# Patient Record
Sex: Male | Born: 1950 | Race: Black or African American | Hispanic: No | Marital: Married | Smoking: Former smoker
Health system: Southern US, Community
[De-identification: ages and names within clinical notes are randomized; demographics above are authoritative.]

## PROBLEM LIST (undated history)

## (undated) DIAGNOSIS — E785 Hyperlipidemia, unspecified: Secondary | ICD-10-CM

## (undated) DIAGNOSIS — N5201 Erectile dysfunction due to arterial insufficiency: Secondary | ICD-10-CM

## (undated) DIAGNOSIS — N2581 Secondary hyperparathyroidism of renal origin: Secondary | ICD-10-CM

## (undated) DIAGNOSIS — N2889 Other specified disorders of kidney and ureter: Secondary | ICD-10-CM

## (undated) DIAGNOSIS — E119 Type 2 diabetes mellitus without complications: Secondary | ICD-10-CM

## (undated) DIAGNOSIS — G4700 Insomnia, unspecified: Secondary | ICD-10-CM

## (undated) DIAGNOSIS — Z992 Dependence on renal dialysis: Secondary | ICD-10-CM

## (undated) DIAGNOSIS — M2550 Pain in unspecified joint: Secondary | ICD-10-CM

## (undated) DIAGNOSIS — E669 Obesity, unspecified: Secondary | ICD-10-CM

## (undated) DIAGNOSIS — I509 Heart failure, unspecified: Secondary | ICD-10-CM

## (undated) DIAGNOSIS — Z87442 Personal history of urinary calculi: Secondary | ICD-10-CM

## (undated) DIAGNOSIS — I10 Essential (primary) hypertension: Secondary | ICD-10-CM

## (undated) DIAGNOSIS — N289 Disorder of kidney and ureter, unspecified: Secondary | ICD-10-CM

## (undated) DIAGNOSIS — N189 Chronic kidney disease, unspecified: Secondary | ICD-10-CM

## (undated) DIAGNOSIS — D649 Anemia, unspecified: Secondary | ICD-10-CM

## (undated) HISTORY — DX: Dependence on renal dialysis: Z99.2

## (undated) HISTORY — PX: RENAL BIOPSY: SHX156

## (undated) HISTORY — PX: TONSILLECTOMY: SUR1361

## (undated) HISTORY — PX: RENAL BIOPSY, PERCUTANEOUS: SUR144

## (undated) HISTORY — DX: Heart failure, unspecified: I50.9

---

## 2009-08-19 ENCOUNTER — Ambulatory Visit (HOSPITAL_COMMUNITY): Admission: RE | Admit: 2009-08-19 | Discharge: 2009-08-19 | Payer: Self-pay | Admitting: General Surgery

## 2012-01-04 ENCOUNTER — Encounter (HOSPITAL_COMMUNITY): Payer: Self-pay | Admitting: Emergency Medicine

## 2012-01-04 ENCOUNTER — Emergency Department (HOSPITAL_COMMUNITY)
Admission: EM | Admit: 2012-01-04 | Discharge: 2012-01-04 | Disposition: A | Discharge status: Home / Self Care | Payer: BC Managed Care – PPO | Attending: Emergency Medicine | Admitting: Emergency Medicine

## 2012-01-04 DIAGNOSIS — R7989 Other specified abnormal findings of blood chemistry: Secondary | ICD-10-CM | POA: Insufficient documentation

## 2012-01-04 DIAGNOSIS — I10 Essential (primary) hypertension: Secondary | ICD-10-CM | POA: Insufficient documentation

## 2012-01-04 DIAGNOSIS — Z87891 Personal history of nicotine dependence: Secondary | ICD-10-CM | POA: Insufficient documentation

## 2012-01-04 DIAGNOSIS — N19 Unspecified kidney failure: Secondary | ICD-10-CM

## 2012-01-04 DIAGNOSIS — E119 Type 2 diabetes mellitus without complications: Secondary | ICD-10-CM | POA: Insufficient documentation

## 2012-01-04 HISTORY — DX: Essential (primary) hypertension: I10

## 2012-01-04 LAB — CBC WITH DIFFERENTIAL/PLATELET
Basophils Absolute: 0 10*3/uL (ref 0.0–0.1)
Basophils Relative: 0 % (ref 0–1)
Eosinophils Absolute: 0.3 10*3/uL (ref 0.0–0.7)
Eosinophils Relative: 4 % (ref 0–5)
HCT: 34.5 % — ABNORMAL LOW (ref 39.0–52.0)
MCH: 27.9 pg (ref 26.0–34.0)
MCHC: 34.5 g/dL (ref 30.0–36.0)
MCV: 81 fL (ref 78.0–100.0)
Monocytes Absolute: 0.7 10*3/uL (ref 0.1–1.0)
Platelets: 293 10*3/uL (ref 150–400)
RDW: 12.1 % (ref 11.5–15.5)
WBC: 6.7 10*3/uL (ref 4.0–10.5)

## 2012-01-04 LAB — BASIC METABOLIC PANEL
CO2: 27 mEq/L (ref 19–32)
Calcium: 9.7 mg/dL (ref 8.4–10.5)
Creatinine, Ser: 3.9 mg/dL — ABNORMAL HIGH (ref 0.50–1.35)
GFR calc Af Amer: 18 mL/min — ABNORMAL LOW (ref >90)
GFR calc non Af Amer: 15 mL/min — ABNORMAL LOW (ref >90)
Sodium: 141 mEq/L (ref 135–145)

## 2012-01-04 NOTE — ED Provider Notes (Signed)
History    This chart was scribed for Garnet Chatmon B. Karle Starch, MD, MD by Rhae Lerner. The patient was seen in room APA02 and the patient's care was started at 12:35PM.   CSN: [id number]  Arrival date & time 01/04/12  1141   First MD Initiated Contact with Patient 01/04/12 1233      Chief Complaint  Patient presents with  . Abnormal Lab    (Consider location/radiation/quality/duration/timing/severity/associated sxs/prior treatment) The history is provided by the patient.   Steven Perez is a 61 y.o. male who presents to the Emergency Department due to Dr. Gerarda Fraction recommending that he come to ED for further evaluation after he went to visit office and had labs. Pt reports that he has generalized weakness. Pt reports that he thinks he had a creatine of 4 from lab results. Denies SOB, n/v/d, urinary problems, fall, chest pain, appetite change. Pt reports hx of HTN and DM. Pt denies any current pain.   Past Medical History  Diagnosis Date  . Diabetes mellitus   . Hypertension     Past Surgical History  Procedure Date  . Tonsillectomy     No family history on file.  History  Substance Use Topics  . Smoking status: Former Research scientist (life sciences)  . Smokeless tobacco: Not on file  . Alcohol Use: Yes     occasionally      Review of Systems  All other systems reviewed and are negative.  10 Systems reviewed and all are negative for acute change except as noted in the HPI.    Allergies  Tetanus toxoids  Home Medications   Current Outpatient Rx  Name Route Sig Dispense Refill  . ACETAMINOPHEN 500 MG PO TABS Oral Take 500 mg by mouth at bedtime as needed. Pain    . ASPIRIN EC 81 MG PO TBEC Oral Take 81 mg by mouth daily.    Marland Kitchen LOSARTAN POTASSIUM-HCTZ 50-12.5 MG PO TABS Oral Take 1 tablet by mouth daily.    Marland Kitchen SIMVASTATIN 80 MG PO TABS Oral Take 40 mg by mouth at bedtime.    Marland Kitchen SITAGLIPTIN-METFORMIN HCL 50-1000 MG PO TABS Oral Take 1 tablet by mouth 2 (two) times daily.    Marland Kitchen TADALAFIL 10 MG PO  TABS Oral Take 10 mg by mouth daily as needed. Sexual Arousal    . TRAZODONE HCL 50 MG PO TABS Oral Take 100 mg by mouth at bedtime.    Marland Kitchen VERAPAMIL HCL ER (CO) 240 MG PO TB24 Oral Take 240 mg by mouth at bedtime.      BP 174/94  Pulse 91  Temp 98.5 F (36.9 C) (Oral)  Resp 18  Ht 5\' 8"  (1.727 m)  Wt 240 lb (108.863 kg)  BMI 36.49 kg/m2  SpO2 99%  Physical Exam  Nursing note and vitals reviewed. Constitutional: He is oriented to person, place, and time. He appears well-developed and well-nourished.  HENT:  Head: Normocephalic and atraumatic.  Eyes: EOM are normal. Pupils are equal, round, and reactive to light.  Neck: Normal range of motion. Neck supple.  Cardiovascular: Normal rate, normal heart sounds and intact distal pulses.   Pulmonary/Chest: Effort normal and breath sounds normal.  Abdominal: Bowel sounds are normal. He exhibits no distension. There is no tenderness.  Musculoskeletal: Normal range of motion. He exhibits no edema and no tenderness.  Neurological: He is alert and oriented to person, place, and time. He has normal strength. No cranial nerve deficit or sensory deficit.  Skin: Skin is warm and dry. No  rash noted.  Psychiatric: He has a normal mood and affect.    ED Course  Procedures (including critical care time) DIAGNOSTIC STUDIES: Oxygen Saturation is 99% on room air, normal by my interpretation.    COORDINATION OF CARE: 12:38PM EDP discusses pt ED treatment with pt     Labs Reviewed  CBC WITH DIFFERENTIAL - Abnormal; Notable for the following:    Hemoglobin 11.9 (*)     HCT 34.5 (*)     All other components within normal limits  BASIC METABOLIC PANEL - Abnormal; Notable for the following:    BUN 47 (*)     Creatinine, Ser 3.90 (*)     GFR calc non Af Amer 15 (*)     GFR calc Af Amer 18 (*)     All other components within normal limits   No results found.   No diagnosis found.    MDM  Confirmed with Dr. Nolon Rod office that patient had  baseline Cr <1.0 in December. Apparently they tried to get him an appointment with Dr. Lowanda Foster but did not hear back. I spoke with both Dr. Lowanda Foster and his clinic staff and they will see him in the office next week. He is not anuric, not acidotic and not hyperkalemic. No indication for emergent dialysis or admission.   I personally performed the services described in the documentation, which were scribed in my presence. The recorded information has been reviewed and considered.         Puja Caffey B. Karle Starch, MD 01/04/12 1355

## 2012-01-04 NOTE — ED Notes (Signed)
Patient states had annual physical with Dr Gerarda Fraction, told to come to ED for further evaluation. Creatinine levels were elevated. Denies urinary changes or s/s. Patient with history of HTN and DM.

## 2012-01-04 NOTE — ED Notes (Signed)
MD at bedside. 

## 2013-10-23 ENCOUNTER — Ambulatory Visit (INDEPENDENT_AMBULATORY_CARE_PROVIDER_SITE_OTHER): Payer: 59 | Admitting: Urology

## 2013-10-23 ENCOUNTER — Other Ambulatory Visit: Payer: Self-pay | Admitting: Urology

## 2013-10-23 DIAGNOSIS — N529 Male erectile dysfunction, unspecified: Secondary | ICD-10-CM

## 2013-10-23 DIAGNOSIS — R310 Gross hematuria: Secondary | ICD-10-CM

## 2013-10-31 ENCOUNTER — Ambulatory Visit (HOSPITAL_COMMUNITY)
Admission: RE | Admit: 2013-10-31 | Discharge: 2013-10-31 | Disposition: A | Discharge status: Home / Self Care | Payer: 59 | Source: Ambulatory Visit | Attending: Urology | Admitting: Urology

## 2013-10-31 ENCOUNTER — Encounter (HOSPITAL_COMMUNITY): Payer: Self-pay

## 2013-10-31 DIAGNOSIS — N289 Disorder of kidney and ureter, unspecified: Secondary | ICD-10-CM | POA: Insufficient documentation

## 2013-10-31 DIAGNOSIS — R310 Gross hematuria: Secondary | ICD-10-CM | POA: Insufficient documentation

## 2013-10-31 MED ORDER — SODIUM CHLORIDE 0.9 % IJ SOLN
INTRAMUSCULAR | Status: AC
  Filled 2013-10-31: qty 750

## 2013-10-31 MED ORDER — IOHEXOL 300 MG/ML  SOLN
125.0000 mL | Freq: Once | INTRAMUSCULAR | Status: AC | PRN
  Administered 2013-10-31: 125 mL via INTRAVENOUS

## 2013-11-20 ENCOUNTER — Other Ambulatory Visit: Payer: Self-pay | Admitting: Urology

## 2013-11-20 ENCOUNTER — Ambulatory Visit (INDEPENDENT_AMBULATORY_CARE_PROVIDER_SITE_OTHER): Payer: 59 | Admitting: Urology

## 2013-11-20 DIAGNOSIS — N289 Disorder of kidney and ureter, unspecified: Secondary | ICD-10-CM

## 2013-11-20 DIAGNOSIS — R310 Gross hematuria: Secondary | ICD-10-CM

## 2013-11-22 ENCOUNTER — Encounter (HOSPITAL_COMMUNITY): Payer: Self-pay

## 2013-11-22 ENCOUNTER — Other Ambulatory Visit: Payer: Self-pay | Admitting: Urology

## 2013-11-22 ENCOUNTER — Ambulatory Visit (HOSPITAL_COMMUNITY)
Admission: RE | Admit: 2013-11-22 | Discharge: 2013-11-22 | Disposition: A | Discharge status: Home / Self Care | Payer: 59 | Source: Ambulatory Visit | Attending: Urology | Admitting: Urology

## 2013-11-22 DIAGNOSIS — N289 Disorder of kidney and ureter, unspecified: Secondary | ICD-10-CM | POA: Insufficient documentation

## 2013-11-22 LAB — POCT I-STAT CREATININE: Creatinine, Ser: 9.8 mg/dL — ABNORMAL HIGH (ref 0.50–1.35)

## 2013-11-22 MED ORDER — SODIUM CHLORIDE 0.9 % IV SOLN
INTRAVENOUS | Status: AC
  Filled 2013-11-22: qty 100

## 2013-11-26 ENCOUNTER — Inpatient Hospital Stay (HOSPITAL_COMMUNITY): Payer: 59

## 2013-11-26 ENCOUNTER — Inpatient Hospital Stay (HOSPITAL_COMMUNITY)
Admission: EM | Admit: 2013-11-26 | Discharge: 2013-12-04 | DRG: 683 | Disposition: A | Discharge status: Home / Self Care | Payer: 59 | Attending: Internal Medicine | Admitting: Internal Medicine

## 2013-11-26 ENCOUNTER — Encounter (HOSPITAL_COMMUNITY): Payer: Self-pay | Admitting: Emergency Medicine

## 2013-11-26 DIAGNOSIS — E876 Hypokalemia: Secondary | ICD-10-CM | POA: Diagnosis not present

## 2013-11-26 DIAGNOSIS — D649 Anemia, unspecified: Secondary | ICD-10-CM | POA: Diagnosis present

## 2013-11-26 DIAGNOSIS — E872 Acidosis, unspecified: Secondary | ICD-10-CM | POA: Diagnosis present

## 2013-11-26 DIAGNOSIS — E785 Hyperlipidemia, unspecified: Secondary | ICD-10-CM | POA: Diagnosis present

## 2013-11-26 DIAGNOSIS — Q619 Cystic kidney disease, unspecified: Secondary | ICD-10-CM | POA: Diagnosis not present

## 2013-11-26 DIAGNOSIS — Z833 Family history of diabetes mellitus: Secondary | ICD-10-CM

## 2013-11-26 DIAGNOSIS — D62 Acute posthemorrhagic anemia: Secondary | ICD-10-CM

## 2013-11-26 DIAGNOSIS — E119 Type 2 diabetes mellitus without complications: Secondary | ICD-10-CM | POA: Diagnosis present

## 2013-11-26 DIAGNOSIS — I10 Essential (primary) hypertension: Secondary | ICD-10-CM

## 2013-11-26 DIAGNOSIS — N039 Chronic nephritic syndrome with unspecified morphologic changes: Secondary | ICD-10-CM

## 2013-11-26 DIAGNOSIS — Z87891 Personal history of nicotine dependence: Secondary | ICD-10-CM

## 2013-11-26 DIAGNOSIS — N179 Acute kidney failure, unspecified: Secondary | ICD-10-CM | POA: Diagnosis present

## 2013-11-26 DIAGNOSIS — D631 Anemia in chronic kidney disease: Secondary | ICD-10-CM | POA: Diagnosis present

## 2013-11-26 DIAGNOSIS — N189 Chronic kidney disease, unspecified: Secondary | ICD-10-CM

## 2013-11-26 DIAGNOSIS — N184 Chronic kidney disease, stage 4 (severe): Secondary | ICD-10-CM | POA: Diagnosis present

## 2013-11-26 DIAGNOSIS — N19 Unspecified kidney failure: Secondary | ICD-10-CM

## 2013-11-26 DIAGNOSIS — Z794 Long term (current) use of insulin: Secondary | ICD-10-CM

## 2013-11-26 DIAGNOSIS — I129 Hypertensive chronic kidney disease with stage 1 through stage 4 chronic kidney disease, or unspecified chronic kidney disease: Secondary | ICD-10-CM | POA: Diagnosis present

## 2013-11-26 DIAGNOSIS — D472 Monoclonal gammopathy: Secondary | ICD-10-CM | POA: Diagnosis present

## 2013-11-26 DIAGNOSIS — I150 Renovascular hypertension: Secondary | ICD-10-CM

## 2013-11-26 HISTORY — DX: Hyperlipidemia, unspecified: E78.5

## 2013-11-26 LAB — CBC WITH DIFFERENTIAL/PLATELET
Basophils Absolute: 0 10*3/uL (ref 0.0–0.1)
Basophils Relative: 0 % (ref 0–1)
EOS ABS: 0.1 10*3/uL (ref 0.0–0.7)
Eosinophils Relative: 2 % (ref 0–5)
HCT: 30.8 % — ABNORMAL LOW (ref 39.0–52.0)
HEMOGLOBIN: 10.8 g/dL — AB (ref 13.0–17.0)
LYMPHS ABS: 1.2 10*3/uL (ref 0.7–4.0)
Lymphocytes Relative: 23 % (ref 12–46)
MCH: 27.8 pg (ref 26.0–34.0)
MCHC: 35.1 g/dL (ref 30.0–36.0)
MCV: 79.2 fL (ref 78.0–100.0)
MONOS PCT: 7 % (ref 3–12)
Monocytes Absolute: 0.4 10*3/uL (ref 0.1–1.0)
NEUTROS PCT: 68 % (ref 43–77)
Neutro Abs: 3.7 10*3/uL (ref 1.7–7.7)
Platelets: 253 10*3/uL (ref 150–400)
RBC: 3.89 MIL/uL — AB (ref 4.22–5.81)
RDW: 12.4 % (ref 11.5–15.5)
WBC: 5.4 10*3/uL (ref 4.0–10.5)

## 2013-11-26 LAB — BASIC METABOLIC PANEL
BUN: 138 mg/dL — AB (ref 6–23)
CO2: 16 meq/L — AB (ref 19–32)
Calcium: 9 mg/dL (ref 8.4–10.5)
Chloride: 92 mEq/L — ABNORMAL LOW (ref 96–112)
Creatinine, Ser: 12.12 mg/dL — ABNORMAL HIGH (ref 0.50–1.35)
GFR calc Af Amer: 4 mL/min — ABNORMAL LOW (ref >90)
GFR, EST NON AFRICAN AMERICAN: 4 mL/min — AB (ref >90)
GLUCOSE: 103 mg/dL — AB (ref 70–99)
POTASSIUM: 3.7 meq/L (ref 3.7–5.3)
Sodium: 136 mEq/L — ABNORMAL LOW (ref 137–147)

## 2013-11-26 LAB — URINE MICROSCOPIC-ADD ON

## 2013-11-26 LAB — URINALYSIS, ROUTINE W REFLEX MICROSCOPIC
BILIRUBIN URINE: NEGATIVE
GLUCOSE, UA: NEGATIVE mg/dL
HGB URINE DIPSTICK: NEGATIVE
Ketones, ur: NEGATIVE mg/dL
Nitrite: NEGATIVE
PH: 5 (ref 5.0–8.0)
Protein, ur: NEGATIVE mg/dL
SPECIFIC GRAVITY, URINE: 1.016 (ref 1.005–1.030)
UROBILINOGEN UA: 0.2 mg/dL (ref 0.0–1.0)

## 2013-11-26 NOTE — ED Notes (Signed)
Pt to xray at this time.

## 2013-11-26 NOTE — ED Notes (Signed)
Pt in stating he was told to come to ED due to elevated kidney functions, states he had an MRI to evaluate something on his left kidney last week, since that time he has had labs taken three times and function continues to worsen, hx of renal failure two years ago, pt c/o generalized weakness but denies any pain.

## 2013-11-27 ENCOUNTER — Encounter (HOSPITAL_COMMUNITY): Payer: Self-pay | Admitting: Internal Medicine

## 2013-11-27 DIAGNOSIS — E872 Acidosis, unspecified: Secondary | ICD-10-CM

## 2013-11-27 DIAGNOSIS — D649 Anemia, unspecified: Secondary | ICD-10-CM | POA: Diagnosis present

## 2013-11-27 DIAGNOSIS — D631 Anemia in chronic kidney disease: Secondary | ICD-10-CM

## 2013-11-27 DIAGNOSIS — N189 Chronic kidney disease, unspecified: Secondary | ICD-10-CM

## 2013-11-27 DIAGNOSIS — N179 Acute kidney failure, unspecified: Principal | ICD-10-CM

## 2013-11-27 DIAGNOSIS — E119 Type 2 diabetes mellitus without complications: Secondary | ICD-10-CM | POA: Diagnosis present

## 2013-11-27 DIAGNOSIS — N039 Chronic nephritic syndrome with unspecified morphologic changes: Secondary | ICD-10-CM

## 2013-11-27 DIAGNOSIS — I10 Essential (primary) hypertension: Secondary | ICD-10-CM | POA: Diagnosis present

## 2013-11-27 DIAGNOSIS — N19 Unspecified kidney failure: Secondary | ICD-10-CM

## 2013-11-27 LAB — CK: Total CK: 118 U/L (ref 7–232)

## 2013-11-27 LAB — CBC WITH DIFFERENTIAL/PLATELET
BASOS ABS: 0 10*3/uL (ref 0.0–0.1)
BASOS PCT: 0 % (ref 0–1)
Eosinophils Absolute: 0.1 10*3/uL (ref 0.0–0.7)
Eosinophils Relative: 2 % (ref 0–5)
HEMATOCRIT: 31.6 % — AB (ref 39.0–52.0)
HEMOGLOBIN: 11 g/dL — AB (ref 13.0–17.0)
LYMPHS PCT: 30 % (ref 12–46)
Lymphs Abs: 1.5 10*3/uL (ref 0.7–4.0)
MCH: 27.8 pg (ref 26.0–34.0)
MCHC: 34.8 g/dL (ref 30.0–36.0)
MCV: 79.8 fL (ref 78.0–100.0)
MONO ABS: 0.3 10*3/uL (ref 0.1–1.0)
Monocytes Relative: 6 % (ref 3–12)
NEUTROS ABS: 3 10*3/uL (ref 1.7–7.7)
Neutrophils Relative %: 62 % (ref 43–77)
Platelets: 252 10*3/uL (ref 150–400)
RBC: 3.96 MIL/uL — ABNORMAL LOW (ref 4.22–5.81)
RDW: 12.4 % (ref 11.5–15.5)
WBC: 4.8 10*3/uL (ref 4.0–10.5)

## 2013-11-27 LAB — GLUCOSE, CAPILLARY: Glucose-Capillary: 119 mg/dL — ABNORMAL HIGH (ref 70–99)

## 2013-11-27 LAB — SODIUM, URINE, RANDOM: SODIUM UR: 32 meq/L

## 2013-11-27 LAB — COMPREHENSIVE METABOLIC PANEL
ALK PHOS: 32 U/L — AB (ref 39–117)
ALT: 11 U/L (ref 0–53)
AST: 12 U/L (ref 0–37)
Albumin: 4.5 g/dL (ref 3.5–5.2)
BUN: 140 mg/dL — AB (ref 6–23)
CALCIUM: 9 mg/dL (ref 8.4–10.5)
CO2: 15 meq/L — AB (ref 19–32)
Chloride: 95 mEq/L — ABNORMAL LOW (ref 96–112)
Creatinine, Ser: 12.23 mg/dL — ABNORMAL HIGH (ref 0.50–1.35)
GFR calc Af Amer: 4 mL/min — ABNORMAL LOW (ref >90)
GFR calc non Af Amer: 4 mL/min — ABNORMAL LOW (ref >90)
Glucose, Bld: 92 mg/dL (ref 70–99)
Potassium: 3.5 mEq/L — ABNORMAL LOW (ref 3.7–5.3)
Sodium: 140 mEq/L (ref 137–147)
TOTAL PROTEIN: 8.1 g/dL (ref 6.0–8.3)
Total Bilirubin: 0.7 mg/dL (ref 0.3–1.2)

## 2013-11-27 LAB — TSH: TSH: 1.63 u[IU]/mL (ref 0.350–4.500)

## 2013-11-27 LAB — CREATININE, URINE, RANDOM: CREATININE, URINE: 114.62 mg/dL

## 2013-11-27 MED ORDER — SODIUM BICARBONATE 650 MG PO TABS
650.0000 mg | ORAL_TABLET | Freq: Every day | ORAL | Status: DC
  Administered 2013-11-27 – 2013-11-30 (×4): 650 mg via ORAL
  Filled 2013-11-27 (×4): qty 1

## 2013-11-27 MED ORDER — ONDANSETRON HCL 4 MG PO TABS: 4.0000 mg | ORAL_TABLET | Freq: Four times a day (QID) | ORAL | Status: DC | PRN

## 2013-11-27 MED ORDER — ACETAMINOPHEN 325 MG PO TABS: 650.0000 mg | ORAL_TABLET | Freq: Four times a day (QID) | ORAL | Status: DC | PRN

## 2013-11-27 MED ORDER — INSULIN GLARGINE 100 UNIT/ML ~~LOC~~ SOLN
20.0000 [IU] | Freq: Every day | SUBCUTANEOUS | Status: DC
  Administered 2013-11-27 – 2013-12-03 (×7): 20 [IU] via SUBCUTANEOUS
  Filled 2013-11-27 (×8): qty 0.2

## 2013-11-27 MED ORDER — VERAPAMIL HCL ER 240 MG PO TBCR
240.0000 mg | EXTENDED_RELEASE_TABLET | Freq: Every day | ORAL | Status: DC
  Administered 2013-11-27 – 2013-11-30 (×5): 240 mg via ORAL
  Filled 2013-11-27 (×6): qty 1

## 2013-11-27 MED ORDER — ACETAMINOPHEN 650 MG RE SUPP: 650.0000 mg | Freq: Four times a day (QID) | RECTAL | Status: DC | PRN

## 2013-11-27 MED ORDER — HEPARIN SODIUM (PORCINE) 5000 UNIT/ML IJ SOLN
5000.0000 [IU] | Freq: Three times a day (TID) | INTRAMUSCULAR | Status: DC
  Administered 2013-11-27 – 2013-12-04 (×20): 5000 [IU] via SUBCUTANEOUS
  Filled 2013-11-27 (×25): qty 1

## 2013-11-27 MED ORDER — SODIUM CHLORIDE 0.9 % IJ SOLN
3.0000 mL | Freq: Two times a day (BID) | INTRAMUSCULAR | Status: DC
  Administered 2013-11-27 – 2013-12-04 (×9): 3 mL via INTRAVENOUS

## 2013-11-27 MED ORDER — STERILE WATER FOR INJECTION IV SOLN
150.0000 meq | INTRAVENOUS | Status: DC
  Administered 2013-11-27 – 2013-11-29 (×5): 150 meq via INTRAVENOUS
  Filled 2013-11-27 (×15): qty 850

## 2013-11-27 MED ORDER — ONDANSETRON HCL 4 MG/2ML IJ SOLN: 4.0000 mg | Freq: Four times a day (QID) | INTRAMUSCULAR | Status: DC | PRN

## 2013-11-27 MED ORDER — TRAZODONE HCL 100 MG PO TABS
100.0000 mg | ORAL_TABLET | Freq: Every day | ORAL | Status: DC
  Administered 2013-11-27 – 2013-12-03 (×8): 100 mg via ORAL
  Filled 2013-11-27 (×9): qty 1

## 2013-11-27 MED ORDER — ATORVASTATIN CALCIUM 40 MG PO TABS
40.0000 mg | ORAL_TABLET | Freq: Every day | ORAL | Status: DC
  Administered 2013-11-28 – 2013-12-03 (×6): 40 mg via ORAL
  Filled 2013-11-27 (×8): qty 1

## 2013-11-27 MED ORDER — INSULIN ASPART 100 UNIT/ML ~~LOC~~ SOLN
0.0000 [IU] | Freq: Three times a day (TID) | SUBCUTANEOUS | Status: DC
  Administered 2013-11-27 – 2013-11-28 (×2): 2 [IU] via SUBCUTANEOUS
  Administered 2013-11-28 – 2013-12-02 (×3): 1 [IU] via SUBCUTANEOUS

## 2013-11-27 NOTE — Progress Notes (Signed)
9:28 AM I agree with HPI/GPe and A/P per Dr. Hal Hope      63 y/o ?, known h/o htn. Dmty 2, hld, CKD stage 4, MRI showing ? Complex cyst follwed by Dr. Diona Fanti of Urology admitted form Renal Md office with acute worsening of creatinine    HEENT eomi, ncat CHEST clear no added sound   Patient Active Problem List   Diagnosis Date Noted  . Renal failure (ARF), acute on chronic 11/27/2013  . Diabetes mellitus 11/27/2013  . HTN (hypertension) 11/27/2013  . Anemia 11/27/2013  . Metabolic acidosis 123456  . Acute renal failure 11/27/2013  . AKI (acute kidney injury) 11/26/2013   Add bicarb 650 bid for low hco3 of 15 Continue SSI and lantus 20 daily Await renal input  Verneita Griffes, MD [facility] [phone]

## 2013-11-27 NOTE — H&P (Signed)
[facility] History and Physical  Sigurd Villari [id number] DOB: 1950/08/02 DOA: 11/26/2013  Referring physician: ER physician. PCP: Glo Herring., MD  Specialists: Dr. Graylon Gunning. Nephrologist.  Chief Complaint: Abnormal labs.  HPI: Steven Perez is a 64 y.o. male with history of chronic kidney disease, diabetes mellitus, hyperlipidemia, hypertension was referred to the ER as patient's labs done at patient's primary care office was found to have increased creatinine from baseline. Labs done in the ER shows creatinine of 12 which has increased from 9.8 four days ago. Patient denies any nausea vomiting abdominal pain diarrhea. Denies any chest pain or shortness of breath. Has been having generalized weakness and fatigue. On-call nephrologist was consulted by ER physician and at this time patient will be admitted for further management. Patient states that he still makes urine. He has had hematuria last month and had MRI which showed renal cysts for which nephrologist is following up. Patient states that subsequent urinalysis has not shown any further blood in the urine.   Review of Systems: As presented in the history of presenting illness, rest negative.  Past Medical History  Diagnosis Date  . Diabetes mellitus   . Hypertension   . HLD (hyperlipidemia)    Past Surgical History  Procedure Laterality Date  . Tonsillectomy     Social History:  reports that he has quit smoking. He does not have any smokeless tobacco history on file. He reports that he drinks alcohol. He reports that he does not use illicit drugs. Where does patient live home. Can patient participate in ADLs? Yes.  Allergies  Allergen Reactions  . Tetanus Toxoids Anaphylaxis    Family History:  Family History  Problem Relation Age of Onset  . Diabetes Mellitus II Mother   . CAD Neg Hx   . Stroke Neg Hx       Prior to Admission medications   Medication Sig Start Date End Date Taking? Authorizing  Provider  acetaminophen (TYLENOL) 500 MG tablet Take 500 mg by mouth at bedtime as needed. Pain   Yes Historical Provider, MD  insulin glargine (LANTUS) 100 UNIT/ML injection Inject 20 Units into the skin at bedtime.   Yes Historical Provider, MD  simvastatin (ZOCOR) 80 MG tablet Take 40 mg by mouth at bedtime.   Yes Historical Provider, MD  traZODone (DESYREL) 50 MG tablet Take 100 mg by mouth at bedtime.   Yes Historical Provider, MD  verapamil (COVERA HS) 240 MG (CO) 24 hr tablet Take 240 mg by mouth at bedtime.   Yes Historical Provider, MD  tadalafil (CIALIS) 10 MG tablet Take 10 mg by mouth daily as needed. Sexual Arousal    Historical Provider, MD    Physical Exam: Filed Vitals:   11/26/13 2045 11/26/13 2115 11/26/13 2130 11/26/13 2250  BP: 130/64 133/69 133/71 140/74  Pulse: 62 64 59 63  Temp:    97.7 F (36.5 C)  TempSrc:    Oral  Resp: 14 12 12 16   Height:    5' 8.5" (1.74 m)  Weight:    100.29 kg (221 lb 1.6 oz)  SpO2: 97% 99% 99% 100%     General:  Well-developed well-nourished.  Eyes: Anicteric no pallor.  ENT: No discharge from the ears eyes nose mouth.  Neck: No mass felt.  Cardiovascular: S1-S2 heard.  Respiratory: No rhonchi or crepitations.  Abdomen: Soft nontender bowel sounds present. No guarding or rigidity.  Skin: No rash.  Musculoskeletal: No edema.  Psychiatric: Appears normal.  Neurologic: Alert awake  oriented to time place and person. Moves all extremities.  Labs on Admission:  Basic Metabolic Panel:  Recent Labs Lab 11/22/13 0835 11/26/13 1840  NA  --  136*  K  --  3.7  CL  --  92*  CO2  --  16*  GLUCOSE  --  103*  BUN  --  138*  CREATININE 9.80* 12.12*  CALCIUM  --  9.0   Liver Function Tests: No results found for this basename: AST, ALT, ALKPHOS, BILITOT, PROT, ALBUMIN,  in the last 168 hours No results found for this basename: LIPASE, AMYLASE,  in the last 168 hours No results found for this basename: AMMONIA,  in the last  168 hours CBC:  Recent Labs Lab 11/26/13 1840  WBC 5.4  NEUTROABS 3.7  HGB 10.8*  HCT 30.8*  MCV 79.2  PLT 253   Cardiac Enzymes: No results found for this basename: CKTOTAL, CKMB, CKMBINDEX, TROPONINI,  in the last 168 hours  BNP (last 3 results) No results found for this basename: PROBNP,  in the last 8760 hours CBG: No results found for this basename: GLUCAP,  in the last 168 hours  Radiological Exams on Admission: Dg Chest 1 View  11/26/2013   CLINICAL DATA:  Acute renal failure  EXAM: CHEST - 1 VIEW  COMPARISON:  None.  FINDINGS: The heart size and mediastinal contours are within normal limits. Both lungs are clear. The visualized skeletal structures are unremarkable.  IMPRESSION: No active disease.   Electronically Signed   By: Kathreen Devoid   On: 11/26/2013 21:52     Assessment/Plan Principal Problem:   Renal failure (ARF), acute on chronic Active Problems:   Diabetes mellitus   HTN (hypertension)   Anemia   Metabolic acidosis   Acute renal failure   1. Acute on chronic renal failure with metabolic acidosis - at this time patient admitted for further observation. Closely follow intake output. Repeat labs has been ordered for a.m. Patient at this time does not have any signs of fluid overload and does not complain of any shortness of breath. There is no hyperkalemia. Further recommendations per nephrologist. 2. Chronic anemia - probably from chronic kidney disease. Follow CBC. 3. Diabetes mellitus2 - was recently started on Lantus insulin by patient's primary care 2-3 days ago. Closely follow CBGs for any hypoglycemic events. 4. Hypertension - continue present medications. 5. Hyperlipidemia - on statins. Check CK levels. 6. Renal cysts in the recent MRI - further workup per primary care.    Code Status: Full code.  Family Communication: None.  Disposition Plan: Admit to inpatient.    KAKRAKANDY,ARSHAD N. [facility] Pager [phone].  If 7PM-7AM,  please contact night-coverage [email or web address] Password [facility] 11/27/2013, 12:34 AM

## 2013-11-27 NOTE — Consult Note (Signed)
Reason for Consult:AKi Referring Physician: Verlon Au, MD  Steven Perez is an 63 y.o. male.  HPI: Pt is a 63yo AAM with PMH sig for CKD stage 3 presumably due to DM and HTN (baseline Scr 1.5 in Jan 2015 followed by Dr. Posey Pronto at [facility]) who developed gross hematuria at the beginning of June and was evaluated by Urology.  He underwent a CT scan with contrast on 10/31/13 which revealed a 1.8cm enhancing lesion of his left kidney with high suspicion for RCC.  He had an istat ordered on 11/22/13 by Dr. Diona Fanti which was significant for a Scr of 9.8.  Renal US was negative for hydro and repeat labs on 11/26/13 showed Scr of 11.96, phos 10, and K of 3.8.  He was then instructed to go to [facility] ED to be further evaluated for his AKI. The trend in Scr is seen below.  Of note, [name] had been on Hyzaar and janumet until 11/23/13 and was told to stop these after his labs were reviewed.    He denies any N/V/D/urinary retention, rash, new medications, NSAIDs/COX-II I's.  He did have a cystoscopy after the CT scan which he reports was negative and did take 2 doses of an antibiotic but he does not know what it was.  Trend in Creatinine: Creatinine, Ser  Date/Time Value Ref Range Status  11/27/2013  6:03 AM 12.23* 0.50 - 1.35 mg/dL Final  11/26/2013  6:40 PM 12.12* 0.50 - 1.35 mg/dL Final  11/22/2013  8:35 AM 9.80* 0.50 - 1.35 mg/dL Final  06/13/2013  1.5 0.50 - 1.35 mg/dL Final  01/04/2012 12:24 PM 3.90* 0.50 - 1.35 mg/dL Final    PMH:   Past Medical History  Diagnosis Date  . Diabetes mellitus   . Hypertension   . HLD (hyperlipidemia)     PSH:   Past Surgical History  Procedure Laterality Date  . Tonsillectomy      Allergies:  Allergies  Allergen Reactions  . Tetanus Toxoids Anaphylaxis    Medications:   Prior to Admission medications   Medication Sig Start Date End Date Taking? Authorizing Provider  acetaminophen (TYLENOL) 500 MG tablet Take 500 mg by mouth at bedtime as needed. Pain   Yes Historical  Provider, MD  insulin glargine (LANTUS) 100 UNIT/ML injection Inject 20 Units into the skin at bedtime.   Yes Historical Provider, MD  simvastatin (ZOCOR) 80 MG tablet Take 40 mg by mouth at bedtime.   Yes Historical Provider, MD  traZODone (DESYREL) 50 MG tablet Take 100 mg by mouth at bedtime.   Yes Historical Provider, MD  verapamil (COVERA HS) 240 MG (CO) 24 hr tablet Take 240 mg by mouth at bedtime.   Yes Historical Provider, MD  tadalafil (CIALIS) 10 MG tablet Take 10 mg by mouth daily as needed. Sexual Arousal    Historical Provider, MD    Inpatient medications: . atorvastatin  40 mg Oral q1800  . heparin  5,000 Units Subcutaneous 3 times per day  . insulin aspart  0-9 Units Subcutaneous TID WC  . insulin glargine  20 Units Subcutaneous QHS  . sodium bicarbonate  650 mg Oral Daily  . sodium chloride  3 mL Intravenous Q12H  . traZODone  100 mg Oral QHS  . verapamil  240 mg Oral QHS    Discontinued Meds:   Medications Discontinued During This Encounter  Medication Reason  . aspirin EC 81 MG tablet Patient has not taken in last 30 days  . losartan-hydrochlorothiazide (HYZAAR) 50-12.5  MG per tablet Discontinued by provider  . sitaGLIPtan-metformin (JANUMET) 50-1000 MG per tablet Discontinued by provider    Social History:  reports that he has quit smoking. He does not have any smokeless tobacco history on file. He reports that he drinks alcohol. He reports that he does not use illicit drugs.  Family History:   Family History  Problem Relation Age of Onset  . Diabetes Mellitus II Mother   . CAD Neg Hx   . Stroke Neg Hx     A comprehensive review of systems was negative except for: Constitutional: positive for chills and fatigue Gastrointestinal: positive for constipation Weight change:   Intake/Output Summary (Last 24 hours) at 11/27/13 1232 Last data filed at 11/27/13 0900  Gross per 24 hour  Intake    483 ml  Output      0 ml  Net    483 ml   BP 131/62  Pulse 66   Temp(Src) 97.4 F (36.3 C) (Oral)  Resp 18  Ht 5' 8.5" (1.74 m)  Wt 100.29 kg (221 lb 1.6 oz)  BMI 33.13 kg/m2  SpO2 100% Filed Vitals:   11/26/13 2250 11/27/13 0158 11/27/13 0500 11/27/13 1000  BP: 140/74 125/76 117/65 131/62  Pulse: 63 63 62 66  Temp: 97.7 F (36.5 C)  97.6 F (36.4 C) 97.4 F (36.3 C)  TempSrc: Oral  Oral Oral  Resp: 16  16 18   Height: 5' 8.5" (1.74 m)     Weight: 100.29 kg (221 lb 1.6 oz)     SpO2: 100%  100% 100%     General appearance: alert, cooperative and no distress Head: Normocephalic, without obvious abnormality, atraumatic Eyes: negative findings: lids and lashes normal, conjunctivae and sclerae normal and corneas clear Neck: no adenopathy, no carotid bruit, no JVD, supple, symmetrical, trachea midline and thyroid not enlarged, symmetric, no tenderness/mass/nodules Resp: clear to auscultation bilaterally Cardio: regular rate and rhythm, S1, S2 normal, no murmur, click, rub or gallop GI: soft, non-tender; bowel sounds normal; no masses,  no organomegaly Extremities: extremities normal, atraumatic, no cyanosis or edema  Labs: Basic Metabolic Panel:  Recent Labs Lab 11/22/13 0835 11/26/13 1840 11/27/13 0603  NA  --  136* 140  K  --  3.7 3.5*  CL  --  92* 95*  CO2  --  16* 15*  GLUCOSE  --  103* 92  BUN  --  138* 140*  CREATININE 9.80* 12.12* 12.23*  ALBUMIN  --   --  4.5  CALCIUM  --  9.0 9.0   Liver Function Tests:  Recent Labs Lab 11/27/13 0603  AST 12  ALT 11  ALKPHOS 32*  BILITOT 0.7  PROT 8.1  ALBUMIN 4.5   No results found for this basename: LIPASE, AMYLASE,  in the last 168 hours No results found for this basename: AMMONIA,  in the last 168 hours CBC:  Recent Labs Lab 11/26/13 1840 11/27/13 0603  WBC 5.4 4.8  NEUTROABS 3.7 3.0  HGB 10.8* 11.0*  HCT 30.8* 31.6*  MCV 79.2 79.8  PLT 253 252   PT/INR: @LABRCNTIP (inr:5) Cardiac Enzymes: ) Recent Labs Lab 11/27/13 0603  CKTOTAL 118   CBG: No results found  for this basename: GLUCAP,  in the last 168 hours  Iron Studies: No results found for this basename: IRON, TIBC, TRANSFERRIN, FERRITIN,  in the last 168 hours  Xrays/Other Studies: Dg Chest 1 View  11/26/2013   CLINICAL DATA:  Acute renal failure  EXAM: CHEST - 1  VIEW  COMPARISON:  None.  FINDINGS: The heart size and mediastinal contours are within normal limits. Both lungs are clear. The visualized skeletal structures are unremarkable.  IMPRESSION: No active disease.   Electronically Signed   By: Kathreen Devoid   On: 11/26/2013 21:52     Assessment/Plan: 1.  AKI/CKD- (CKD stage 3 with baseline Scr of 1.5 as of Jan 2015 due to DM and HTN) unclear etiology but possibly contrast induced nephropathy in the setting of ARB.  Also on the DDx would be an acute glomerulonephritis given his gross hematuria which preceded the CT scan and cysto. 1. Order SPEP/UPEP, CPK, ANA, complements, dsDNA, ANCA 2. Will try to obtain records from Dr. Alan Ripper office regarding Scr prior to CT and cysto findings.   2. Metabolic acidosis- will start isotonic bicarb and follow 3. HTN- stable 4. DM- per primary, cont to hold metformin 5. Anemia of chronic disease 6. 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

## 2013-11-27 NOTE — Progress Notes (Addendum)
New Admission Note:   Arrival: via ED Mental Orientation: A&Ox4 Telemetry: none ordered Assessment:  See doc flowsheet Skin: intact, scabs on bilateral legs IV: left AC, saline locked Pain: none Safety Measures:  Call bell placed within reach; patient instructed on use of call bell and verbalized understanding. Bed in lowest position.  Non-skid socks on.  [address] [place] Orientation: Patient oriented to staff, room, and unit. Family: Wife at bedside  Orders have been reviewed and implemented. Will continue to monitor.  Arlyss Queen, RN, BSN

## 2013-11-28 LAB — KAPPA/LAMBDA LIGHT CHAINS
KAPPA FREE LGHT CHN: 5.79 mg/dL — AB (ref 0.33–1.94)
Kappa, lambda light chain ratio: 2 — ABNORMAL HIGH (ref 0.26–1.65)
Lambda free light chains: 2.9 mg/dL — ABNORMAL HIGH (ref 0.57–2.63)

## 2013-11-28 LAB — GLUCOSE, CAPILLARY
GLUCOSE-CAPILLARY: 156 mg/dL — AB (ref 70–99)
Glucose-Capillary: 175 mg/dL — ABNORMAL HIGH (ref 70–99)
Glucose-Capillary: 113 mg/dL — ABNORMAL HIGH (ref 70–99)
Glucose-Capillary: 134 mg/dL — ABNORMAL HIGH (ref 70–99)
Glucose-Capillary: 130 mg/dL — ABNORMAL HIGH (ref 70–99)

## 2013-11-28 LAB — C4 COMPLEMENT: COMPLEMENT C4, BODY FLUID: 53 mg/dL — AB (ref 10–40)

## 2013-11-28 LAB — ANA: ANA: NEGATIVE

## 2013-11-28 LAB — RENAL FUNCTION PANEL
ALBUMIN: 4.2 g/dL (ref 3.5–5.2)
BUN: 138 mg/dL — AB (ref 6–23)
CO2: 18 mEq/L — ABNORMAL LOW (ref 19–32)
CREATININE: 12.34 mg/dL — AB (ref 0.50–1.35)
Calcium: 8.7 mg/dL (ref 8.4–10.5)
Chloride: 90 mEq/L — ABNORMAL LOW (ref 96–112)
GFR calc Af Amer: 4 mL/min — ABNORMAL LOW (ref >90)
GFR calc non Af Amer: 4 mL/min — ABNORMAL LOW (ref >90)
Glucose, Bld: 128 mg/dL — ABNORMAL HIGH (ref 70–99)
PHOSPHORUS: 9.3 mg/dL — AB (ref 2.3–4.6)
POTASSIUM: 3.4 meq/L — AB (ref 3.7–5.3)
Sodium: 136 mEq/L — ABNORMAL LOW (ref 137–147)

## 2013-11-28 LAB — ANTISTREPTOLYSIN O TITER: ASO: <25 IU/mL (ref <409)

## 2013-11-28 LAB — MPO/PR-3 (ANCA) ANTIBODIES
Myeloperoxidase Abs: <1
Serine Protease 3: <1

## 2013-11-28 LAB — GLOMERULAR BASEMENT MEMBRANE ANTIBODIES

## 2013-11-28 LAB — C3 COMPLEMENT: C3 Complement: 118 mg/dL (ref 90–180)

## 2013-11-28 LAB — ANTI-DNA ANTIBODY, DOUBLE-STRANDED

## 2013-11-28 MED ORDER — BISACODYL 5 MG PO TBEC
5.0000 mg | DELAYED_RELEASE_TABLET | Freq: Every day | ORAL | Status: DC | PRN
  Administered 2013-11-28: 5 mg via ORAL
  Filled 2013-11-28: qty 1

## 2013-11-28 MED ORDER — POLYETHYLENE GLYCOL 3350 17 G PO PACK
17.0000 g | PACK | Freq: Every day | ORAL | Status: DC | PRN
  Administered 2013-11-28: 17 g via ORAL
  Filled 2013-11-28: qty 1

## 2013-11-28 NOTE — Progress Notes (Signed)
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  Per nephrology   Consultants:  Nephrology  Procedures:  CXR  Antibiotics:  none   HPI/Subjective:  Energy better today.  Appetite is good.  Denies SOB, swelling, nausea, vomiting    Objective: Filed Vitals:   11/27/13 1719 11/27/13 2100 11/28/13 0500 11/28/13 0938  BP: 144/65 125/69 126/65 133/69  Pulse: 70 60 68 65  Temp: 98 F (36.7 C) 97.7 F (36.5 C) 97.7 F (36.5 C) 98.4 F (36.9 C)  TempSrc: Oral Oral Oral Oral  Resp: 16 16 16 16   Height:      Weight:  100.608 kg (221 lb 12.8 oz)    SpO2: 98% 99% 99% 98%    Intake/Output Summary (Last 24 hours) at 11/28/13 1249 Last data filed at 11/28/13 1000  Gross per 24 hour  Intake    240 ml  Output    350 ml  Net   -110 ml   Filed Weights   11/26/13 1727 11/26/13 2250 11/27/13 2100  Weight: 98.884  kg (218 lb) 100.29 kg (221 lb 1.6 oz) 100.608 kg (221 lb 12.8 oz)    Exam:   General:  BM, No acute distress  HEENT:  NCAT, MMM  Cardiovascular:  RRR, nl S1, S2 no mrg, 2+ pulses, warm extremities  Respiratory:  CTAB, no increased WOB  Abdomen:   NABS, soft, NT/ND  MSK:   Normal tone and bulk, no LEE  Neuro:  Grossly intact  Data Reviewed: Basic Metabolic Panel:  Recent Labs Lab 11/22/13 0835 11/26/13 1840 11/27/13 0603 11/28/13 0548  NA  --  136* 140 136*  K  --  3.7 3.5* 3.4*  CL  --  92* 95* 90*  CO2  --  16* 15* 18*  GLUCOSE  --  103* 92 128*  BUN  --  138* 140* 138*  CREATININE 9.80* 12.12* 12.23* 12.34*  CALCIUM  --  9.0 9.0 8.7  PHOS  --   --   --  9.3*   Liver Function Tests:  Recent Labs Lab 11/27/13 0603 11/28/13 0548  AST 12  --   ALT 11  --   ALKPHOS 32*  --   BILITOT 0.7  --   PROT 8.1  --   ALBUMIN 4.5 4.2   No results found for this basename: LIPASE, AMYLASE,  in the last 168 hours No results found for  this basename: AMMONIA,  in the last 168 hours CBC:  Recent Labs Lab 11/26/13 1840 11/27/13 0603  WBC 5.4 4.8  NEUTROABS 3.7 3.0  HGB 10.8* 11.0*  HCT 30.8* 31.6*  MCV 79.2 79.8  PLT 253 252   Cardiac Enzymes:  Recent Labs Lab 11/27/13 0603  CKTOTAL 118   BNP (last 3 results) No results found for this basename: PROBNP,  in the last 8760 hours CBG:  Recent Labs Lab 11/27/13 1643 11/27/13 2116 11/28/13 0757 11/28/13 1225  GLUCAP 119* 175* 113* 134*    No results found for this or any previous visit (from the past 240 hour(s)).   Studies: Dg Chest 1 View  11/26/2013   CLINICAL DATA:  Acute renal failure  EXAM: CHEST - 1 VIEW  COMPARISON:  None.  FINDINGS: The heart size and mediastinal contours are within normal limits. Both lungs are clear. The visualized skeletal structures are unremarkable.  IMPRESSION: No active disease.   Electronically Signed   By: Kathreen Devoid   On: 11/26/2013 21:52    Scheduled Meds: .  atorvastatin  40 mg Oral q1800  . heparin  5,000 Units Subcutaneous 3 times per day  . insulin aspart  0-9 Units Subcutaneous TID WC  . insulin glargine  20 Units Subcutaneous QHS  . sodium bicarbonate  650 mg Oral Daily  . sodium chloride  3 mL Intravenous Q12H  . traZODone  100 mg Oral QHS  . verapamil  240 mg Oral QHS   Continuous Infusions: .  sodium bicarbonate 150 mEq in sterile water 1000 mL infusion 150 mEq (11/28/13 0103)    Principal Problem:   Renal failure (ARF), acute on chronic Active Problems:   Diabetes mellitus   HTN (hypertension)   Anemia   Metabolic acidosis   Acute renal failure    Time spent: 30 min    Carylon Tamburro, [facility] Pager [phone]. If 7PM-7AM, please contact night-coverage at [email or web address], password [facility] 11/28/2013, 12:49 PM  LOS: 2 days

## 2013-11-28 NOTE — Progress Notes (Addendum)
S: No complainst today. Says he feels exactly the same- at his baseline. No Nausea or vomiting.  O:BP 133/69  Pulse 65  Temp(Src) 98.4 F (36.9 C) (Oral)  Resp 16  Ht 5' 8.5" (1.74 m)  Wt 221 lb 12.8 oz (100.608 kg)  BMI 33.23 kg/m2  SpO2 98%  Intake/Output Summary (Last 24 hours) at 11/28/13 1258 Last data filed at 11/28/13 1000  Gross per 24 hour  Intake    240 ml  Output    350 ml  Net   -110 ml   Intake/Output: I/O last 3 completed shifts: In: 59 [P.O.:480; I.V.:3] Out: -   Intake/Output this shift:  Total I/O In: 240 [P.O.:240] Out: 350 [Urine:350] Weight change: 3 lb 12.8 oz (1.724 kg)  Gen: Pleasant gentleman, Sitting in recliner at bedside, NAD Heent: AT, [place], EOMI, moist oral mucosa, Neck supple.  CVS: Regular rate and rhythm, no murmurs appreciated, no rubs or gallops Resp: Moving equal volumes of air, no wheezes or crackles appreciated. Abd: Soft, non tender, full, normal for pt. Ext: Pulses 2+ and symmetric, no pedal edema. Neuro: Alert and oriented, moving all extremities voluntarily   Recent Labs Lab 11/22/13 0835 11/26/13 1840 11/27/13 0603 11/28/13 0548  NA  --  136* 140 136*  K  --  3.7 3.5* 3.4*  CL  --  92* 95* 90*  CO2  --  16* 15* 18*  GLUCOSE  --  103* 92 128*  BUN  --  138* 140* 138*  CREATININE 9.80* 12.12* 12.23* 12.34*  ALBUMIN  --   --  4.5 4.2  CALCIUM  --  9.0 9.0 8.7  PHOS  --   --   --  9.3*  AST  --   --  12  --   ALT  --   --  11  --    Liver Function Tests:  Recent Labs Lab 11/27/13 0603 11/28/13 0548  AST 12  --   ALT 11  --   ALKPHOS 32*  --   BILITOT 0.7  --   PROT 8.1  --   ALBUMIN 4.5 4.2    Recent Labs Lab 11/26/13 1840 11/27/13 0603  WBC 5.4 4.8  NEUTROABS 3.7 3.0  HGB 10.8* 11.0*  HCT 30.8* 31.6*  MCV 79.2 79.8  PLT 253 252   Cardiac Enzymes:  Recent Labs Lab 11/27/13 0603  CKTOTAL 118   CBG:  Recent Labs Lab 11/27/13 1643 11/27/13 2116 11/28/13 0757 11/28/13 1225  GLUCAP 119*  175* 113* 134*   Studies/Results: Dg Chest 1 View  11/26/2013   CLINICAL DATA:  Acute renal failure  EXAM: CHEST - 1 VIEW  COMPARISON:  None.  FINDINGS: The heart size and mediastinal contours are within normal limits. Both lungs are clear. The visualized skeletal structures are unremarkable.  IMPRESSION: No active disease.   Electronically Signed   By: Kathreen Devoid   On: 11/26/2013 21:52   . atorvastatin  40 mg Oral q1800  . heparin  5,000 Units Subcutaneous 3 times per day  . insulin aspart  0-9 Units Subcutaneous TID WC  . insulin glargine  20 Units Subcutaneous QHS  . sodium bicarbonate  650 mg Oral Daily  . sodium chloride  3 mL Intravenous Q12H  . traZODone  100 mg Oral QHS  . verapamil  240 mg Oral QHS    BMET    Component Value Date/Time   NA 136* 11/28/2013 0548   K 3.4* 11/28/2013 [id number]  CL 90* 11/28/2013 0548   CO2 18* 11/28/2013 0548   GLUCOSE 128* 11/28/2013 0548   BUN 138* 11/28/2013 0548   CREATININE 12.34* 11/28/2013 0548   CALCIUM 8.7 11/28/2013 0548   GFRNONAA 4* 11/28/2013 0548   GFRAA 4* 11/28/2013 0548   CBC    Component Value Date/Time   WBC 4.8 11/27/2013 0603   RBC 3.96* 11/27/2013 0603   HGB 11.0* 11/27/2013 0603   HCT 31.6* 11/27/2013 0603   PLT 252 11/27/2013 0603   MCV 79.8 11/27/2013 0603   MCH 27.8 11/27/2013 0603   MCHC 34.8 11/27/2013 0603   RDW 12.4 11/27/2013 0603   LYMPHSABS 1.5 11/27/2013 0603   MONOABS 0.3 11/27/2013 0603   EOSABS 0.1 11/27/2013 0603   BASOSABS 0.0 11/27/2013 0603     Assessment/Plan:  1. 1. AKI/CKD : Likely due to Contrast induced nephropathy,  With on going Lorsatan use and metformin. Hematuria concerning for Glomerulonephritis. Urine Na- 32, Cr- 114.62,  ASO WNL, Mild C4 elevation-53, normal C3, ANA- Normal, ANCA normal.  2. Metabolic acidosis- Bicarb improved to 18 today from 15 yesterday, on Oral bicarb.   3. HTN- Stable, on verapamil 240mg  daily  4. DM- On SSI and Lantus  5. Possible RCC  6. Anemia of chronic disease- Appears  stable.   Denton Brick, [place] PGY-2  I have seen and examined this patient and agree with plan as outlined by Dr. Denton Brick.  I also discussed the role of a renal biopsy as we do not have a definitive cause of his acute vs. Subacute renal failure.  Awaiting SPEP and UPEP but would also ask IR to not only perform renal biopsy for AKI but also a biopsy of the cystic lesion on his left kidney that is worrisome for malignancy.  Will discuss further with primary svc. Nicholos Aloisi A,MD 11/28/2013 2:27 PM

## 2013-11-29 ENCOUNTER — Inpatient Hospital Stay (HOSPITAL_COMMUNITY): Payer: 59

## 2013-11-29 LAB — RENAL FUNCTION PANEL
ALBUMIN: 4.3 g/dL (ref 3.5–5.2)
ANION GAP: 24 — AB (ref 5–15)
BUN: 129 mg/dL — ABNORMAL HIGH (ref 6–23)
CO2: 29 mEq/L (ref 19–32)
Calcium: 8.4 mg/dL (ref 8.4–10.5)
Chloride: 88 mEq/L — ABNORMAL LOW (ref 96–112)
Creatinine, Ser: 11.14 mg/dL — ABNORMAL HIGH (ref 0.50–1.35)
GFR calc Af Amer: 5 mL/min — ABNORMAL LOW (ref >90)
GFR, EST NON AFRICAN AMERICAN: 4 mL/min — AB (ref >90)
Glucose, Bld: 113 mg/dL — ABNORMAL HIGH (ref 70–99)
POTASSIUM: 3.2 meq/L — AB (ref 3.7–5.3)
Phosphorus: 7.6 mg/dL — ABNORMAL HIGH (ref 2.3–4.6)
Sodium: 141 mEq/L (ref 137–147)

## 2013-11-29 LAB — PROTIME-INR
INR: 1.06 (ref 0.00–1.49)
PROTHROMBIN TIME: 13.8 s (ref 11.6–15.2)

## 2013-11-29 LAB — FERRITIN: Ferritin: 483 ng/mL — ABNORMAL HIGH (ref 22–322)

## 2013-11-29 LAB — GLUCOSE, CAPILLARY
GLUCOSE-CAPILLARY: 107 mg/dL — AB (ref 70–99)
GLUCOSE-CAPILLARY: 95 mg/dL (ref 70–99)
Glucose-Capillary: 150 mg/dL — ABNORMAL HIGH (ref 70–99)
Glucose-Capillary: 158 mg/dL — ABNORMAL HIGH (ref 70–99)

## 2013-11-29 LAB — VITAMIN B12: Vitamin B-12: 542 pg/mL (ref 211–911)

## 2013-11-29 LAB — COMPLEMENT, TOTAL: Compl, Total (CH50): >60 U/mL — ABNORMAL HIGH (ref 31–60)

## 2013-11-29 LAB — TRANSFERRIN: Transferrin: 232 mg/dL (ref 200–360)

## 2013-11-29 LAB — IRON AND TIBC
Iron: 117 ug/dL (ref 42–135)
Saturation Ratios: 40 % (ref 20–55)
TIBC: 290 ug/dL (ref 215–435)
UIBC: 173 ug/dL (ref 125–400)

## 2013-11-29 LAB — APTT: aPTT: 30 seconds (ref 24–37)

## 2013-11-29 MED ORDER — POTASSIUM CHLORIDE CRYS ER 20 MEQ PO TBCR
40.0000 meq | EXTENDED_RELEASE_TABLET | Freq: Once | ORAL | Status: AC
  Administered 2013-11-29: 40 meq via ORAL
  Filled 2013-11-29: qty 2

## 2013-11-29 NOTE — Plan of Care (Signed)
Problem: Phase I Progression Outcomes Goal: Initial discharge plan identified Outcome: Completed/Met Date Met:  11/29/13 Pt to return home with spouse. Goal: Other Phase I Outcomes/Goals Outcome: Progressing Pt to have renal biopsy on Monday, July 6th.

## 2013-11-29 NOTE — H&P (Signed)
Steven Perez is an 63 y.o. male.   Chief Complaint: pt with known stage 3 Chronic kidney disease Onset hematuria a month ago- resolved; metabolic acidosis Recent worsening creatinine CT reveals L renal complex cyst- worrisome for neoplasm Anemia; ?myeloma Request has been made from Dr Marval Regal for consult for Random renal biopsy Possible L renal lesion biopsy Dr Anselm Pancoast has reviewed imaging and discussed case with Dr Marval Regal Now scheduled for random renal biopsy--- May consider left renal lesion biopsy at later date---Urology to be consulted  HPI: DM; HTN; HLD  Past Medical History  Diagnosis Date  . Diabetes mellitus   . Hypertension   . HLD (hyperlipidemia)     Past Surgical History  Procedure Laterality Date  . Tonsillectomy      Family History  Problem Relation Age of Onset  . Diabetes Mellitus II Mother   . CAD Neg Hx   . Stroke Neg Hx    Social History:  reports that he has quit smoking. He does not have any smokeless tobacco history on file. He reports that he drinks alcohol. He reports that he does not use illicit drugs.  Allergies:  Allergies  Allergen Reactions  . Tetanus Toxoids Anaphylaxis    Medications Prior to Admission  Medication Sig Dispense Refill  . acetaminophen (TYLENOL) 500 MG tablet Take 500 mg by mouth at bedtime as needed. Pain      . insulin glargine (LANTUS) 100 UNIT/ML injection Inject 20 Units into the skin at bedtime.      . simvastatin (ZOCOR) 80 MG tablet Take 40 mg by mouth at bedtime.      . traZODone (DESYREL) 50 MG tablet Take 100 mg by mouth at bedtime.      . verapamil (COVERA HS) 240 MG (CO) 24 hr tablet Take 240 mg by mouth at bedtime.      . tadalafil (CIALIS) 10 MG tablet Take 10 mg by mouth daily as needed. Sexual Arousal        Results for orders placed during the hospital encounter of 11/26/13 (from the past 48 hour(s))  KAPPA/LAMBDA LIGHT CHAINS     Status: Abnormal   Collection Time    11/27/13  2:17 PM       Result Value Ref Range   Kappa free light chain 5.79 (*) 0.33 - 1.94 mg/dL   Lamda free light chains 2.90 (*) 0.57 - 2.63 mg/dL   Kappa, lamda light chain ratio 2.00 (*) 0.26 - 1.65   Comment: Performed at [facility]  ANA     Status: None   Collection Time    11/27/13  2:17 PM      Result Value Ref Range   ANA NEGATIVE  NEGATIVE   Comment: Performed at [facility]  MPO/PR-3 (ANCA) ANTIBODIES     Status: None   Collection Time    11/27/13  2:17 PM      Result Value Ref Range   Myeloperoxidase Abs <1.0  <1.0 AI   Comment: (NOTE)                                  Value   Interpretation                                 <1.0 AI: No Antibody Detected                              >  or=1.0 AI: Antibody Detected     Autoantibodies to myeloperoxidase (MPO) are commonly     associated with the following small-vessel     vasculitides: microscopic polyangiitis,     polyarteritis nodosa, Churg-Strauss syndrome,     necrotizing and crescentic glomerulonephritis and     occasionally Wegener's granulomatosis. The perinuclear     IFA pattern, (p-ANCA), is based largely on autoantibody     to myeloperoxidase which serves as the primary antigen     These autoantibodies are present in active disease     state.   Serine Protease 3 <1.0  <1.0 AI   Comment: (NOTE)                                  Value   Interpretation                                 <1.0 AI: No Antibody Detected                              >or=1.0 AI: Antibody Detected     Autoantibodies to proteinase-3 (PR-3) are accepted as     characteristic for granulomatosis with polyangiitis     (Wegener's), and are detectable in 95% of the     histologically proven cases. The cytoplasmic IFA     pattern, (c-ANCA), is based largely on autoantibody to     PR-3 which serves as the primary antigen.     These autoantibodies are present in active disease     state.     Performed at [facility]      Status: None   Collection Time    11/27/13  2:17 PM      Result Value Ref Range   C3 Complement 118  90 - 180 mg/dL   Comment: Performed at [facility]     Status: Abnormal   Collection Time    11/27/13  2:17 PM      Result Value Ref Range   Complement C4, Body Fluid 53 (*) 10 - 40 mg/dL   Comment: Performed at [place], DOUBLE-STRANDED     Status: None   Collection Time    11/27/13  2:17 PM      Result Value Ref Range   ds DNA Ab <1     Comment: (NOTE)                                  IU/mL       Interpretation                                  < or = 4    Negative                                  5-9         Indeterminate                                  >  or = 10   Positive     Performed at [facility]     Status: None   Collection Time    11/27/13  2:17 PM      Result Value Ref Range   ASO <25  <409 IU/mL   Comment: Performed at [facility]     Status: None   Collection Time    11/27/13  2:17 PM      Result Value Ref Range   GBM Ab <1.0  <1.0 AI   Comment: (NOTE)                                  Value   Interpretation                                 <1.0 AI: No Antibody Detected                              >or=1.0 AI: Antibody Detected     Performed at [place], CAPILLARY     Status: Abnormal   Collection Time    11/27/13  4:43 PM      Result Value Ref Range   Glucose-Capillary 119 (*) 70 - 99 mg/dL  GLUCOSE, CAPILLARY     Status: Abnormal   Collection Time    11/27/13  9:16 PM      Result Value Ref Range   Glucose-Capillary 175 (*) 70 - 99 mg/dL  RENAL FUNCTION PANEL     Status: Abnormal   Collection Time    11/28/13  5:48 AM      Result Value Ref Range   Sodium 136 (*) 137 - 147 mEq/L   Potassium 3.4 (*) 3.7 - 5.3 mEq/L   Chloride 90 (*) 96 - 112 mEq/L   CO2 18 (*) 19 - 32 mEq/L   Glucose, Bld  128 (*) 70 - 99 mg/dL   BUN 138 (*) 6 - 23 mg/dL   Creatinine, Ser 12.34 (*) 0.50 - 1.35 mg/dL   Calcium 8.7  8.4 - 10.5 mg/dL   Phosphorus 9.3 (*) 2.3 - 4.6 mg/dL   Albumin 4.2  3.5 - 5.2 g/dL   GFR calc non Af Amer 4 (*) >90 mL/min   GFR calc Af Amer 4 (*) >90 mL/min   Comment: (NOTE)     The eGFR has been calculated using the CKD EPI equation.     This calculation has not been validated in all clinical situations.     eGFR's persistently <90 mL/min signify possible Chronic Kidney     Disease.  GLUCOSE, CAPILLARY     Status: Abnormal   Collection Time    11/28/13  7:57 AM      Result Value Ref Range   Glucose-Capillary 113 (*) 70 - 99 mg/dL  GLUCOSE, CAPILLARY     Status: Abnormal   Collection Time    11/28/13 12:25 PM      Result Value Ref Range   Glucose-Capillary 134 (*) 70 - 99 mg/dL   Comment 1 Notify RN     Comment 2 Documented in Chart    GLUCOSE, CAPILLARY     Status: Abnormal   Collection Time    11/28/13  4:42 PM  Result Value Ref Range   Glucose-Capillary 156 (*) 70 - 99 mg/dL  GLUCOSE, CAPILLARY     Status: Abnormal   Collection Time    11/28/13  8:38 PM      Result Value Ref Range   Glucose-Capillary 130 (*) 70 - 99 mg/dL  RENAL FUNCTION PANEL     Status: Abnormal   Collection Time    11/29/13  5:00 AM      Result Value Ref Range   Sodium 141  137 - 147 mEq/L   Potassium 3.2 (*) 3.7 - 5.3 mEq/L   Chloride 88 (*) 96 - 112 mEq/L   CO2 29  19 - 32 mEq/L   Glucose, Bld 113 (*) 70 - 99 mg/dL   BUN 129 (*) 6 - 23 mg/dL   Creatinine, Ser 11.14 (*) 0.50 - 1.35 mg/dL   Calcium 8.4  8.4 - 10.5 mg/dL   Phosphorus 7.6 (*) 2.3 - 4.6 mg/dL   Albumin 4.3  3.5 - 5.2 g/dL   GFR calc non Af Amer 4 (*) >90 mL/min   GFR calc Af Amer 5 (*) >90 mL/min   Comment: (NOTE)     The eGFR has been calculated using the CKD EPI equation.     This calculation has not been validated in all clinical situations.     eGFR's persistently <90 mL/min signify possible Chronic Kidney      Disease.   Anion gap 24 (*) 5 - 15   Comment: RESULT CHECKED  FERRITIN     Status: Abnormal   Collection Time    11/29/13  5:05 AM      Result Value Ref Range   Ferritin 483 (*) 22 - 322 ng/mL   Comment: Performed at [facility]     Status: None   Collection Time    11/29/13  5:05 AM      Result Value Ref Range   Vitamin B-12 542  211 - 911 pg/mL   Comment: Performed at [place], CAPILL[facility]     Status: Abnormal   Collection Time    11/29/13  7:38 AM      Result Value Ref Range   Glucose-Capillary 107 (*) 70 - 99 mg/dL  GLUCOSE, CAPILLARY     Status: Abnormal   Collection Time    11/29/13 11:38 AM      Result Value Ref Range   Glucose-Capillary 150 (*) 70 - 99 mg/dL  PROTIME-INR     Status: None   Collection Time    11/29/13 12:20 PM      Result Value Ref Range   Prothrombin Time 13.8  11.6 - 15.2 seconds   INR 1.06  0.00 - 1.49  APTT     Status: None   Collection Time    11/29/13 12:20 PM      Result Value Ref Range   aPTT 30  24 - 37 seconds   No results found.  Review of Systems  Constitutional: Negative for fever and weight loss.  Respiratory: Negative for sputum production.   Cardiovascular: Negative for chest pain.  Gastrointestinal: Negative for nausea and vomiting.  Neurological: Positive for weakness. Negative for dizziness and headaches.  Psychiatric/Behavioral: Negative for substance abuse.    Blood pressure 148/80, pulse 65, temperature 97.7 F (36.5 C), temperature source Oral, resp. rate 17, height 5' 8.5" (1.74 m), weight 101.2 kg (223 lb 1.7 oz), SpO2 100.00%. Physical Exam  Constitutional: He is oriented to  person, place, and time.  Cardiovascular: Normal rate and regular rhythm.   No murmur heard. Respiratory: Effort normal and breath sounds normal. He has no wheezes.  GI: Soft. Bowel sounds are normal. There is no tenderness.  Musculoskeletal: Normal range of motion.  Neurological: He is alert  and oriented to person, place, and time.  Skin: Skin is warm and dry.  Psychiatric: He has a normal mood and affect. His behavior is normal. Judgment and thought content normal.     Assessment/Plan Acute on chronic renal failure Recent hematuria- resolved Scheduled for random renal biopsy in [place] Pt aware of procedure benefits and risks and agreeable to proceed Consent signed and in chart   [place] A 11/29/2013, 1:06 PM

## 2013-11-29 NOTE — Plan of Care (Signed)
Problem: Consults Goal: ARF/New Onset CRF Patient Education See Patient Education Module for education specifics.  Outcome: Completed/Met Date Met:  11/29/13 Education by hospitalist, radiologist, and nephrologist for renal biopsy. Pt verbalized that he was educated about reason for biopsy, pros and cons, and what defines a biopsy.

## 2013-11-29 NOTE — Progress Notes (Signed)
Patient ID: Steven Perez, male   DOB: December 31, 1950, 63 y.o.   MRN: [id number]   Pt was scheduled for random renal biopsy to be performed in [place] today.  Unfortunately, pathology will not be able to process samples secondary to time of day at this point and Holiday.   Must reschedule to [facility] 12/03/13 Will replace orders for that time and date.

## 2013-11-29 NOTE — Progress Notes (Signed)
S: No complaints today. Walking around room. No weakness, dizziness, nausea or vomiting. Good appetite, tolerating diet. Feels he is at baseline.   O:BP 126/67  Pulse 64  Temp(Src) 98.3 F (36.8 C) (Oral)  Resp 18  Ht 5' 8.5" (1.74 m)  Wt 223 lb 1.7 oz (101.2 kg)  BMI 33.43 kg/m2  SpO2 100%  Intake/Output Summary (Last 24 hours) at 11/29/13 0850 Last data filed at 11/29/13 0600  Gross per 24 hour  Intake   3280 ml  Output   1950 ml  Net   1330 ml   Intake/Output: I/O last 3 completed shifts: In: 3280 [P.O.:1080; I.V.:2200] Out: 1950 [Urine:1950]  Intake/Output this shift:    Weight change: 1 lb 4.9 oz (0.592 kg) Gen: NAD, alert and orirnted. Heent: AT, [place], EOMI, moist oral mucosa, neck supple [id number], no added sounds Resp: Normal work of breathing, No crackles or wheezes Abd: Full, normal for pt, bowel sounds present. Ext: No pedal edema, warm and well perfused. Neuro- Normal strenght all extremities.   Recent Labs Lab 11/26/13 1840 11/27/13 0603 11/28/13 0548 11/29/13 0500  NA 136* 140 136* 141  K 3.7 3.5* 3.4* 3.2*  CL 92* 95* 90* 88*  CO2 16* 15* 18* 29  GLUCOSE 103* 92 128* 113*  BUN 138* 140* 138* 129*  CREATININE 12.12* 12.23* 12.34* 11.14*  ALBUMIN  --  4.5 4.2 4.3  CALCIUM 9.0 9.0 8.7 8.4  PHOS  --   --  9.3* 7.6*  AST  --  12  --   --   ALT  --  11  --   --    Liver Function Tests:  Recent Labs Lab 11/27/13 0603 11/28/13 0548 11/29/13 0500  AST 12  --   --   ALT 11  --   --   ALKPHOS 32*  --   --   BILITOT 0.7  --   --   PROT 8.1  --   --   ALBUMIN 4.5 4.2 4.3   CBC:  Recent Labs Lab 11/26/13 1840 11/27/13 0603  WBC 5.4 4.8  NEUTROABS 3.7 3.0  HGB 10.8* 11.0*  HCT 30.8* 31.6*  MCV 79.2 79.8  PLT 253 252   Cardiac Enzymes:  Recent Labs Lab 11/27/13 0603  CKTOTAL 118   CBG:  Recent Labs Lab 11/28/13 0757 11/28/13 1225 11/28/13 1642 11/28/13 2038 11/29/13 0738  GLUCAP 113* 134* 156* 130* 107*    Iron  Studies: No results found for this basename: IRON, TIBC, TRANSFERRIN, FERRITIN,  in the last 72 hours Studies/Results: No results found. Marland Kitchen atorvastatin  40 mg Oral q1800  . heparin  5,000 Units Subcutaneous 3 times per day  . insulin aspart  0-9 Units Subcutaneous TID WC  . insulin glargine  20 Units Subcutaneous QHS  . potassium chloride  40 mEq Oral Once  . sodium bicarbonate  650 mg Oral Daily  . sodium chloride  3 mL Intravenous Q12H  . traZODone  100 mg Oral QHS  . verapamil  240 mg Oral QHS    BMET    Component Value Date/Time   NA 141 11/29/2013 0500   K 3.2* 11/29/2013 0500   CL 88* 11/29/2013 0500   CO2 29 11/29/2013 0500   GLUCOSE 113* 11/29/2013 0500   BUN 129* 11/29/2013 0500   CREATININE 11.14* 11/29/2013 0500   CALCIUM 8.4 11/29/2013 0500   GFRNONAA 4* 11/29/2013 0500   GFRAA 5* 11/29/2013 0500   CBC    Component Value  Date/Time   WBC 4.8 11/27/2013 0603   RBC 3.96* 11/27/2013 0603   HGB 11.0* 11/27/2013 0603   HCT 31.6* 11/27/2013 0603   PLT 252 11/27/2013 0603   MCV 79.8 11/27/2013 0603   MCH 27.8 11/27/2013 0603   MCHC 34.8 11/27/2013 0603   RDW 12.4 11/27/2013 0603   LYMPHSABS 1.5 11/27/2013 0603   MONOABS 0.3 11/27/2013 0603   EOSABS 0.1 11/27/2013 0603   BASOSABS 0.0 11/27/2013 0603     Assessment/Plan:  1. AKI/CKD - Exact etiology not identified, Work up so far negative. Only identified kidney insult- Contrast exposure. Kappa and lamda light chains elevated, also with anemia- ? Myeloma. SPEP in process. No bone pains. Hx of hematuria- but ASO negative, mild C3 elevation.Possible biopsy of Kidney mass and tissue for definite diagnosis. Cr slight improvement this morning.  - will need renal biopsy not only for AKI but also to evaluate possible neoplasm (which would prevent the use of immunosuppressive agents if in fact he does have an acute GN.  Will discuss with Dr. Diona Fanti.  2. Metabolic acidosis- Bicarb improved to 29 today. Consider stopping Bicarb.  - stop IV bicarb and  cont with po   3. HTN- Stable, on verapamil 240mg  daily  4. DM- On SSI and Lantus   5. Possible RCC   6. Anemia of chronic disease- Appears stable. Anemia panel in process.   Emokpae, Ejiroghene  I have seen and examined this patient and agree with plan as outlined by Dr. Denton Brick.  Will consult IR for renal biopsy and discuss with Urology possible CT-guided bx of possible neoplastic lesion of left kidney. Caleigha Zale A,MD 11/29/2013 10:05 AM

## 2013-11-29 NOTE — Progress Notes (Signed)
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  Bicarb now 29, but BUN and creatinine trending down slightly. -  D/c IVF -  Continue oral bicarb repletion -  Complement levels normal to mildly elevated -  ASO negative -  MPO/PR3 negative -  Anti-GBM neg -  ANA and anti-ds DNA neg -  SPEP/Urine IFE pending, but kappa/lambda both elevated and ratio elevated -  Check beta2-microglobulin -  Appreciate nephrology assistance  -  Agree with renal biopsy for diagnosis   Chronic anemia - probably from chronic kidney disease. 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 Diona Fanti and Dr. Marval Regal, plan for biopsy today  Hypokalemia, oral potassium repletion  Diet:  Renal/diabetic Access:  PIV IVF:  yes Proph:  Heparin   Code Status: full Family Communication: patient alone Disposition Plan:  Pending biopsies   Consultants:  Nephrology  Procedures:  CXR  Antibiotics:  none   HPI/Subjective:  Feels well.  Denies SOB, N/V, swelling  Objective: Filed Vitals:   11/28/13 1803 11/28/13 2045 11/29/13 0515 11/29/13 1033  BP: 133/80 156/72 126/67 148/80  Pulse: 65 63 64 65  Temp: 98.4 F (36.9 C) 97.8 F (36.6 C) 98.3 F (36.8 C) 97.7 F (36.5 C)  TempSrc: Oral Oral Oral Oral  Resp: 20 18 18 17   Height:      Weight:  101.2 kg (223 lb 1.7 oz)    SpO2: 99% 96% 100% 100%    Intake/Output Summary (Last 24 hours) at 11/29/13 1201 Last  data filed at 11/29/13 1122  Gross per 24 hour  Intake   3103 ml  Output   1950 ml  Net   1153 ml   Filed Weights   11/26/13 2250 11/27/13 2100 11/28/13 2045  Weight: 100.29 kg (221 lb 1.6 oz) 100.608 kg (221 lb 12.8 oz) 101.2 kg (223 lb 1.7 oz)    Exam:   General:  BM, No acute distress, smiling, sitting in chair  HEENT:  NCAT, MMM  Cardiovascular:  RRR, nl S1, S2 no mrg, 2+ pulses, warm extremities  Respiratory:  CTAB, no increased WOB  Abdomen:   NABS, soft, NT/ND  MSK:   Normal tone and bulk, no LEE  Neuro:  Grossly intact  Data Reviewed: Basic Metabolic Panel:  Recent Labs Lab 11/26/13 1840 11/27/13 0603 11/28/13 0548 11/29/13 0500  NA 136* 140 136* 141  K 3.7 3.5* 3.4* 3.2*  CL 92* 95* 90* 88*  CO2 16* 15* 18* 29  GLUCOSE 103* 92 128* 113*  BUN 138* 140* 138* 129*  CREATININE 12.12* 12.23* 12.34* 11.14*  CALCIUM 9.0 9.0 8.7 8.4  PHOS  --   --  9.3* 7.6*   Liver Function Tests:  Recent Labs Lab 11/27/13 0603 11/28/13 0548 11/29/13 0500  AST 12  --   --   ALT 11  --   --   ALKPHOS 32*  --   --   BILITOT 0.7  --   --  PROT 8.1  --   --   ALBUMIN 4.5 4.2 4.3   No results found for this basename: LIPASE, AMYLASE,  in the last 168 hours No results found for this basename: AMMONIA,  in the last 168 hours CBC:  Recent Labs Lab 11/26/13 1840 11/27/13 0603  WBC 5.4 4.8  NEUTROABS 3.7 3.0  HGB 10.8* 11.0*  HCT 30.8* 31.6*  MCV 79.2 79.8  PLT 253 252   Cardiac Enzymes:  Recent Labs Lab 11/27/13 0603  CKTOTAL 118   BNP (last 3 results) No results found for this basename: PROBNP,  in the last 8760 hours CBG:  Recent Labs Lab 11/28/13 1225 11/28/13 1642 11/28/13 2038 11/29/13 0738 11/29/13 1138  GLUCAP 134* 156* 130* 107* 150*    No results found for this or any previous visit (from the past 240 hour(s)).   Studies: No results found.  Scheduled Meds: . atorvastatin  40 mg Oral q1800  . heparin  5,000 Units Subcutaneous 3  times per day  . insulin aspart  0-9 Units Subcutaneous TID WC  . insulin glargine  20 Units Subcutaneous QHS  . sodium bicarbonate  650 mg Oral Daily  . sodium chloride  3 mL Intravenous Q12H  . traZODone  100 mg Oral QHS  . verapamil  240 mg Oral QHS   Continuous Infusions: .  sodium bicarbonate 150 mEq in sterile water 1000 mL infusion 150 mEq (11/28/13 1900)    Principal Problem:   Renal failure (ARF), acute on chronic Active Problems:   Diabetes mellitus   HTN (hypertension)   Anemia   Metabolic acidosis   Acute renal failure    Time spent: 30 min    Steven Perez, [facility] Pager [phone]. If 7PM-7AM, please contact night-coverage at [email or web address], password [facility] 11/29/2013, 12:01 PM  LOS: 3 days

## 2013-11-30 LAB — RENAL FUNCTION PANEL
ALBUMIN: 4.3 g/dL (ref 3.5–5.2)
Anion gap: 20 — ABNORMAL HIGH (ref 5–15)
BUN: 117 mg/dL — ABNORMAL HIGH (ref 6–23)
CHLORIDE: 88 meq/L — AB (ref 96–112)
CO2: 35 mEq/L — ABNORMAL HIGH (ref 19–32)
CREATININE: 10.55 mg/dL — AB (ref 0.50–1.35)
Calcium: 8.2 mg/dL — ABNORMAL LOW (ref 8.4–10.5)
GFR, EST AFRICAN AMERICAN: 5 mL/min — AB (ref >90)
GFR, EST NON AFRICAN AMERICAN: 5 mL/min — AB (ref >90)
Glucose, Bld: 81 mg/dL (ref 70–99)
POTASSIUM: 3.8 meq/L (ref 3.7–5.3)
Phosphorus: 6.8 mg/dL — ABNORMAL HIGH (ref 2.3–4.6)
SODIUM: 143 meq/L (ref 137–147)

## 2013-11-30 LAB — GLUCOSE, CAPILLARY
Glucose-Capillary: 83 mg/dL (ref 70–99)
Glucose-Capillary: 134 mg/dL — ABNORMAL HIGH (ref 70–99)
Glucose-Capillary: 132 mg/dL — ABNORMAL HIGH (ref 70–99)

## 2013-11-30 LAB — FOLATE RBC: RBC FOLATE: 691 ng/mL — AB (ref >280)

## 2013-11-30 NOTE — Progress Notes (Signed)
BP this evening is 161/82. Asymptomatic.  Notified MD on call; no new orders.  Will continue to assess.

## 2013-11-30 NOTE — Progress Notes (Addendum)
[facility] PROGRESS NOTE  Collan Facchini [id number] DOB: 1950-06-27 DOA: 11/26/2013 PCP: Glo Herring., MD  Assessment/Plan  Acute on chronic renal failure with metabolic acidosis, possibly CIN in setting of ARB use, but this would be an unusual degree of AKI for CIN.  Bicarb now 29, but BUN and creatinine trending down slightly.  -  D/c IVF   -  Continue oral bicarb repletion -  Complement levels normal to mildly elevated -  ASO negative -  MPO/PR3 negative -  Anti-GBM neg -  ANA and anti-ds DNA neg -  SPEP/Urine IFE pending, but kappa/lambda both elevated and ratio elevated -  beta2-microglobulin pending -  Appreciate nephrology assistance  -  Agree with renal biopsy for diagnosis, rescheduled for Monday  Chronic anemia - probably from chronic kidney disease. hgb stable -  Iron studies wnl, B12 542, folate 691 -  TSH 1.63  Diabetes mellitus 2 - CBG well controlled -  Continue lantus 20 units with low dose SSI  Hypertension - intermittently higher today, but may be stressed prior to procedure -  Trend BP -  Continue verapamil  Hyperlipidemia - stable, continue atorvastatin  Complex left renal cyst concerning for neoplasm -  Case discussed between Dr. Diona Fanti and Dr. Marval Regal -  Biopsy on Monday  Hypokalemia, oral potassium repletion  Diet:  Renal/diabetic Access:  PIV IVF:  yes Proph:  Heparin   Code Status: full Family Communication: patient alone Disposition Plan:  Pending biopsies   Consultants:  Nephrology  Procedures:  CXR  Antibiotics:  none   HPI/Subjective:  No complaints, feels well, ambulating  Objective: Filed Vitals:   11/29/13 2051 11/30/13 0516 11/30/13 0736 11/30/13 1020  BP: 154/82 102/82 152/72 155/74  Pulse: 66 63 58 67  Temp: 98.4 F (36.9 C) 98.6 F (37 C) 98.3 F (36.8 C) 97.8 F (36.6 C)  TempSrc: Oral Oral  Oral  Resp: 18 18 18 20   Height:      Weight: 102.8 kg (226 lb 10.1 oz)     SpO2: 96% 100%       Intake/Output Summary (Last 24 hours) at 11/30/13 1358 Last data filed at 11/30/13 0936  Gross per 24 hour  Intake    240 ml  Output   1350 ml  Net  -1110 ml   Filed Weights   11/27/13 2100 11/28/13 2045 11/29/13 2051  Weight: 100.608 kg (221 lb 12.8 oz) 101.2 kg (223 lb 1.7 oz) 102.8 kg (226 lb 10.1 oz)    Exam:   General:  BM, No acute distress, smiling, walking around room  HEENT:  NCAT, MMM  Cardiovascular:  RRR, nl S1, S2 no mrg, 2+ pulses, warm extremities  Respiratory:  CTAB, no increased WOB  Abdomen:   NABS, soft, NT/ND  MSK:   Normal tone and bulk, no LEE  Neuro:  Grossly intact  Data Reviewed: Basic Metabolic Panel:  Recent Labs Lab 11/26/13 1840 11/27/13 0603 11/28/13 0548 11/29/13 0500 11/30/13 0503  NA 136* 140 136* 141 143  K 3.7 3.5* 3.4* 3.2* 3.8  CL 92* 95* 90* 88* 88*  CO2 16* 15* 18* 29 35*  GLUCOSE 103* 92 128* 113* 81  BUN 138* 140* 138* 129* 117*  CREATININE 12.12* 12.23* 12.34* 11.14* 10.55*  CALCIUM 9.0 9.0 8.7 8.4 8.2*  PHOS  --   --  9.3* 7.6* 6.8*   Liver Function Tests:  Recent Labs Lab 11/27/13 0603 11/28/13 0548 11/29/13 0500 11/30/13 0503  AST 12  --   --   --  ALT 11  --   --   --   ALKPHOS 32*  --   --   --   BILITOT 0.7  --   --   --   PROT 8.1  --   --   --   ALBUMIN 4.5 4.2 4.3 4.3   No results found for this basename: LIPASE, AMYLASE,  in the last 168 hours No results found for this basename: AMMONIA,  in the last 168 hours CBC:  Recent Labs Lab 11/26/13 1840 11/27/13 0603  WBC 5.4 4.8  NEUTROABS 3.7 3.0  HGB 10.8* 11.0*  HCT 30.8* 31.6*  MCV 79.2 79.8  PLT 253 252   Cardiac Enzymes:  Recent Labs Lab 11/27/13 0603  CKTOTAL 118   BNP (last 3 results) No results found for this basename: PROBNP,  in the last 8760 hours CBG:  Recent Labs Lab 11/29/13 1138 11/29/13 1653 11/29/13 2049 11/30/13 0714 11/30/13 1155  GLUCAP 150* 95 158* 83 134*    No results found for this or any  previous visit (from the past 240 hour(s)).   Studies: No results found.  Scheduled Meds: . atorvastatin  40 mg Oral q1800  . heparin  5,000 Units Subcutaneous 3 times per day  . insulin aspart  0-9 Units Subcutaneous TID WC  . insulin glargine  20 Units Subcutaneous QHS  . sodium chloride  3 mL Intravenous Q12H  . traZODone  100 mg Oral QHS  . verapamil  240 mg Oral QHS   Continuous Infusions:    Principal Problem:   Renal failure (ARF), acute on chronic Active Problems:   Diabetes mellitus   HTN (hypertension)   Anemia   Metabolic acidosis   Acute renal failure    Time spent: 15 min    Ma Munoz, [facility] Pager [phone]. If 7PM-7AM, please contact night-coverage at [email or web address], password [facility] 11/30/2013, 1:58 PM  LOS: 4 days

## 2013-11-30 NOTE — Progress Notes (Signed)
S: No complaints today. No nausea or vomiting. Tolerating oral diet. Biopsy not done yesterday because of holiday- July 4th, and therefore sample will not be analysed.   O:BP 152/72  Pulse 58  Temp(Src) 98.3 F (36.8 C) (Oral)  Resp 18  Ht 5' 8.5" (1.74 m)  Wt 226 lb 10.1 oz (102.8 kg)  BMI 33.95 kg/m2  SpO2 100%  Intake/Output Summary (Last 24 hours) at 11/30/13 0921 Last data filed at 11/30/13 0516  Gross per 24 hour  Intake      3 ml  Output   1250 ml  Net  -1247 ml   Intake/Output: I/O last 3 completed shifts: In: [id number] [P.O.:600; I.V.:1103] Out: 2300 [Urine:2300]  Intake/Output this shift:    Weight change: 3 lb 8.4 oz (1.6 kg)  Gen: NAD, wife present at bedside HEENT- AT, [place], neck supple CVS: Regular, no added sounds Resp: Moving equal vols of air, no crackles Abd: Full, no fluid thrill, bowel sounds present Ext: Warm and well perfuse, no pedal edema Neuro- Alert and oriented.   Recent Labs Lab 11/26/13 1840 11/27/13 0603 11/28/13 0548 11/29/13 0500 11/30/13 0503  NA 136* 140 136* 141 143  K 3.7 3.5* 3.4* 3.2* 3.8  CL 92* 95* 90* 88* 88*  CO2 16* 15* 18* 29 35*  GLUCOSE 103* 92 128* 113* 81  BUN 138* 140* 138* 129* 117*  CREATININE 12.12* 12.23* 12.34* 11.14* 10.55*  ALBUMIN  --  4.5 4.2 4.3 4.3  CALCIUM 9.0 9.0 8.7 8.4 8.2*  PHOS  --   --  9.3* 7.6* 6.8*  AST  --  12  --   --   --   ALT  --  11  --   --   --    Liver Function Tests:  Recent Labs Lab 11/27/13 0603 11/28/13 0548 11/29/13 0500 11/30/13 0503  AST 12  --   --   --   ALT 11  --   --   --   ALKPHOS 32*  --   --   --   BILITOT 0.7  --   --   --   PROT 8.1  --   --   --   ALBUMIN 4.5 4.2 4.3 4.3   CBC:  Recent Labs Lab 11/26/13 1840 11/27/13 0603  WBC 5.4 4.8  NEUTROABS 3.7 3.0  HGB 10.8* 11.0*  HCT 30.8* 31.6*  MCV 79.2 79.8  PLT 253 252   Cardiac Enzymes:  Recent Labs Lab 11/27/13 0603  CKTOTAL 118   CBG:  Recent Labs Lab 11/29/13 0738 11/29/13 1138  11/29/13 1653 11/29/13 2049 11/30/13 0714  GLUCAP 107* 150* 95 158* 83    Iron Studies:  Recent Labs  11/29/13 0505  IRON 117  TIBC 290  TRANSFERRIN 232  FERRITIN 483*   Studies/Results: No results found. Marland Kitchen atorvastatin  40 mg Oral q1800  . heparin  5,000 Units Subcutaneous 3 times per day  . insulin aspart  0-9 Units Subcutaneous TID WC  . insulin glargine  20 Units Subcutaneous QHS  . sodium bicarbonate  650 mg Oral Daily  . sodium chloride  3 mL Intravenous Q12H  . traZODone  100 mg Oral QHS  . verapamil  240 mg Oral QHS    BMET    Component Value Date/Time   NA 143 11/30/2013 0503   K 3.8 11/30/2013 0503   CL 88* 11/30/2013 0503   CO2 35* 11/30/2013 0503   GLUCOSE 81 11/30/2013 0503   BUN  117* 11/30/2013 0503   CREATININE 10.55* 11/30/2013 0503   CALCIUM 8.2* 11/30/2013 0503   GFRNONAA 5* 11/30/2013 0503   GFRAA 5* 11/30/2013 0503   CBC    Component Value Date/Time   WBC 4.8 11/27/2013 0603   RBC 3.96* 11/27/2013 0603   HGB 11.0* 11/27/2013 0603   HCT 31.6* 11/27/2013 0603   PLT 252 11/27/2013 0603   MCV 79.8 11/27/2013 0603   MCH 27.8 11/27/2013 0603   MCHC 34.8 11/27/2013 0603   RDW 12.4 11/27/2013 0603   LYMPHSABS 1.5 11/27/2013 0603   MONOABS 0.3 11/27/2013 0603   EOSABS 0.1 11/27/2013 0603   BASOSABS 0.0 11/27/2013 0603     Assessment/Plan:  1. AKI/CKD -  Considerations- Glomerulonephritis Vs Contrast exposure. FeNa- 2.4, suggestive of intrinsic renal path. But feats not consistent for either etiology, with bland urine.  Kappa and lamda light chains mildly elevated, also with anemia- ? Myeloma. SPEP in process. No bone pains. Hx of hematuria- but ASO negative, mild C3 elevation. Possible biopsy of Kidney mass and tissue for definite diagnosis. Biopsy not done yesterday due to holiday- July 4th. Talked to Dr. Diona Fanti yesterday, rec biopsing both lesion and random biopsy at the same time. 2. Metabolic acidosis- Increased Bicarb- 35 today. IV bicarb d/c yesterday. Consider d/c  Bicarb.  3. HTN- Stable, on verapamil 240mg  daily. Consider adding hydralazine if persistently elevated. 4. DM- On SSI and Lantus  5. Possible RCC  6. Anemia of chronic disease- Appears stable. Anemia panel negative.  Steven Perez, Steven Perez  I have seen and examined this patient and agree with plan as outlined by Dr. Denton Brick.  Will discuss with IR the scheduling of both the core renal biopsy and biopsy of his cystic lesion for 12/03/13.  Renal function slowly improving but would not discharge yet unless he has a more significant drop in Scr. [name],MD 11/30/2013 10:03 AM

## 2013-12-01 LAB — RENAL FUNCTION PANEL
ANION GAP: 18 — AB (ref 5–15)
Albumin: 4.2 g/dL (ref 3.5–5.2)
BUN: 110 mg/dL — AB (ref 6–23)
CO2: 35 meq/L — AB (ref 19–32)
Calcium: 8.2 mg/dL — ABNORMAL LOW (ref 8.4–10.5)
Chloride: 92 mEq/L — ABNORMAL LOW (ref 96–112)
Creatinine, Ser: 10.95 mg/dL — ABNORMAL HIGH (ref 0.50–1.35)
GFR calc non Af Amer: 4 mL/min — ABNORMAL LOW (ref >90)
GFR, EST AFRICAN AMERICAN: 5 mL/min — AB (ref >90)
Glucose, Bld: 86 mg/dL (ref 70–99)
Phosphorus: 7.9 mg/dL — ABNORMAL HIGH (ref 2.3–4.6)
Potassium: 3.7 mEq/L (ref 3.7–5.3)
SODIUM: 145 meq/L (ref 137–147)

## 2013-12-01 LAB — GLUCOSE, CAPILLARY
GLUCOSE-CAPILLARY: 84 mg/dL (ref 70–99)
Glucose-Capillary: 163 mg/dL — ABNORMAL HIGH (ref 70–99)
Glucose-Capillary: 149 mg/dL — ABNORMAL HIGH (ref 70–99)
Glucose-Capillary: 87 mg/dL (ref 70–99)
Glucose-Capillary: 118 mg/dL — ABNORMAL HIGH (ref 70–99)

## 2013-12-01 MED ORDER — VERAPAMIL HCL ER 180 MG PO TBCR
360.0000 mg | EXTENDED_RELEASE_TABLET | Freq: Every day | ORAL | Status: DC
  Administered 2013-12-02 – 2013-12-03 (×3): 360 mg via ORAL
  Filled 2013-12-01 (×5): qty 2

## 2013-12-01 NOTE — Progress Notes (Addendum)
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  Bicarb now 29, but BUN and creatinine trending down slightly.  -  Continue oral bicarb repletion -  Complement levels normal to mildly elevated -  ASO negative -  MPO/PR3 negative -  Anti-GBM neg -  ANA and anti-ds DNA neg -  SPEP/Urine IFE pending, but kappa/lambda both elevated and ratio elevated -  beta2-microglobulin pending -  Appreciate nephrology assistance  -  Agree with renal biopsy for diagnosis, rescheduled for Monday  Chronic anemia - probably from chronic kidney disease. hgb stable -  Iron studies wnl, B12 542, folate 691 -  TSH 1.63  Diabetes mellitus 2 - CBG well controlled -  Continue lantus 20 units with low dose SSI  Hypertension, elevated BPs -  Increase verapamil  Hyperlipidemia - stable, continue atorvastatin  Complex left renal cyst concerning for neoplasm -  Case discussed between Dr. Diona Fanti and Dr. Marval Regal -  Biopsy on Monday  Hypokalemia, oral potassium repletion  Diet:  Renal/diabetic Access:  PIV IVF:  yes Proph:  Heparin   Code Status: full Family Communication: patient alone Disposition Plan:  Pending biopsies   Consultants:  Nephrology  Procedures:  CXR  Antibiotics:  none   HPI/Subjective:  Occasional flushing not associated with lightheadedness, CP, SOB or medication administration.  Lasts about 1 minute then self-resolves.  Objective: Filed Vitals:   11/30/13 1836 11/30/13 2100 12/01/13 0445 12/01/13 0900  BP:  161/82 152/82 137/93  Pulse: 60 66 65 84  Temp:  98.9 F (37.2 C) 98.9 F (37.2 C) 98.2 F (36.8 C)  TempSrc:  Oral Oral Oral  Resp:  20 20 19   Height:      Weight:   102.7 kg (226 lb 6.6 oz)   SpO2:  93% 96% 99%     Intake/Output Summary (Last 24 hours) at 12/01/13 1455 Last data filed at 12/01/13 1454  Gross per 24 hour  Intake    360 ml  Output   1975 ml  Net  -1615 ml   Filed Weights   11/28/13 2045 11/29/13 2051 12/01/13 0445  Weight: 101.2 kg (223 lb 1.7 oz) 102.8 kg (226 lb 10.1 oz) 102.7 kg (226 lb 6.6 oz)    Exam:   General:  BM, No acute distress, smiling, walking around room  HEENT:  NCAT, MMM  Cardiovascular:  RRR, nl S1, S2 no mrg, 2+ pulses, warm extremities  Respiratory:  CTAB, no increased WOB  Abdomen:   NABS, soft, NT/ND  MSK:   Normal tone and bulk, trace bilateral LEE  Neuro:  Grossly intact  Data Reviewed: Basic Metabolic Panel:  Recent Labs Lab 11/27/13 0603 11/28/13 0548 11/29/13 0500 11/30/13 0503 12/01/13 0440  NA 140 136* 141 143 145  K 3.5* 3.4* 3.2* 3.8 3.7  CL 95* 90* 88* 88* 92*  CO2 15* 18* 29 35* 35*  GLUCOSE 92 128* 113* 81 86  BUN 140* 138* 129* 117* 110*  CREATININE 12.23* 12.34* 11.14* 10.55* 10.95*  CALCIUM 9.0 8.7 8.4 8.2* 8.2*  PHOS  --  9.3* 7.6* 6.8* 7.9*   Liver Function Tests:  Recent Labs Lab 11/27/13 0603 11/28/13 0548 11/29/13 0500 11/30/13 0503 12/01/13 0440  AST 12  --   --   --   --  ALT 11  --   --   --   --   ALKPHOS 32*  --   --   --   --   BILITOT 0.7  --   --   --   --   PROT 8.1  --   --   --   --   ALBUMIN 4.5 4.2 4.3 4.3 4.2   No results found for this basename: LIPASE, AMYLASE,  in the last 168 hours No results found for this basename: AMMONIA,  in the last 168 hours CBC:  Recent Labs Lab 11/26/13 1840 11/27/13 0603  WBC 5.4 4.8  NEUTROABS 3.7 3.0  HGB 10.8* 11.0*  HCT 30.8* 31.6*  MCV 79.2 79.8  PLT 253 252   Cardiac Enzymes:  Recent Labs Lab 11/27/13 0603  CKTOTAL 118   BNP (last 3 results) No results found for this basename: PROBNP,  in the last 8760 hours CBG:  Recent Labs Lab 11/30/13 1155 11/30/13 1649 11/30/13 2015 12/01/13 0830 12/01/13 1206  GLUCAP 134* 132*  163* 84 149*    No results found for this or any previous visit (from the past 240 hour(s)).   Studies: No results found.  Scheduled Meds: . atorvastatin  40 mg Oral q1800  . heparin  5,000 Units Subcutaneous 3 times per day  . insulin aspart  0-9 Units Subcutaneous TID WC  . insulin glargine  20 Units Subcutaneous QHS  . sodium chloride  3 mL Intravenous Q12H  . traZODone  100 mg Oral QHS  . verapamil  240 mg Oral QHS   Continuous Infusions:    Principal Problem:   Renal failure (ARF), acute on chronic Active Problems:   Diabetes mellitus   HTN (hypertension)   Anemia   Metabolic acidosis   Acute renal failure    Time spent: 15 min    Anay Rathe, [place] [facility] Pager [phone]. If 7PM-7AM, please contact night-coverage at [email or web address], password [email or web address] 12/01/2013, 2:55 PM  LOS: 5 days

## 2013-12-01 NOTE — Progress Notes (Signed)
S: Wife at bedside. No complaints today. No nausea, vomiting or SOB. No complaints of leg swelling.   O:BP 137/93  Pulse 84  Temp(Src) 98.2 F (36.8 C) (Oral)  Resp 19  Ht 5' 8.5" (1.74 m)  Wt 226 lb 6.6 oz (102.7 kg)  BMI 33.92 kg/m2  SpO2 99%  Intake/Output Summary (Last 24 hours) at 12/01/13 0951 Last data filed at 12/01/13 0916  Gross per 24 hour  Intake    480 ml  Output    825 ml  Net   -345 ml   Intake/Output: I/O last 3 completed shifts: In: 480 [P.O.:480] Out: 2175 [Urine:2175]  Intake/Output this shift:  Total I/O In: 240 [P.O.:240] Out: -  Weight change: -3.5 oz (-0.1 kg) Gen: Alert and oriented, in no distress Heent: AT, [place], EOMI, moist oral mucosa. [id number], no MRG Resp: Moving equal vols of air, no wheezes or crackles. Abd: Full, soft, no tenderness, bowel sounds present. Ext: Trace pitting pedal edema, oving all extremities voluntarily.  Recent Labs Lab 11/26/13 1840 11/27/13 0603 11/28/13 0548 11/29/13 0500 11/30/13 0503 12/01/13 0440  NA 136* 140 136* 141 143 145  K 3.7 3.5* 3.4* 3.2* 3.8 3.7  CL 92* 95* 90* 88* 88* 92*  CO2 16* 15* 18* 29 35* 35*  GLUCOSE 103* 92 128* 113* 81 86  BUN 138* 140* 138* 129* 117* 110*  CREATININE 12.12* 12.23* 12.34* 11.14* 10.55* 10.95*  ALBUMIN  --  4.5 4.2 4.3 4.3 4.2  CALCIUM 9.0 9.0 8.7 8.4 8.2* 8.2*  PHOS  --   --  9.3* 7.6* 6.8* 7.9*  AST  --  12  --   --   --   --   ALT  --  11  --   --   --   --    Liver Function Tests:  Recent Labs Lab 11/27/13 0603  11/29/13 0500 11/30/13 0503 12/01/13 0440  AST 12  --   --   --   --   ALT 11  --   --   --   --   ALKPHOS 32*  --   --   --   --   BILITOT 0.7  --   --   --   --   PROT 8.1  --   --   --   --   ALBUMIN 4.5  < > 4.3 4.3 4.2  < > = values in this interval not displayed.  CBC:  Recent Labs Lab 11/26/13 1840 11/27/13 0603  WBC 5.4 4.8  NEUTROABS 3.7 3.0  HGB 10.8* 11.0*  HCT 30.8* 31.6*  MCV 79.2 79.8  PLT 253 252   Cardiac  Enzymes:  Recent Labs Lab 11/27/13 0603  CKTOTAL 118   CBG:  Recent Labs Lab 11/30/13 0714 11/30/13 1155 11/30/13 1649 11/30/13 2015 12/01/13 0830  GLUCAP 83 134* 132* 163* 84    Iron Studies:  Recent Labs  11/29/13 0505  IRON 117  TIBC 290  TRANSFERRIN 232  FERRITIN 483*   Studies/Results: No results found. Marland Kitchen atorvastatin  40 mg Oral q1800  . heparin  5,000 Units Subcutaneous 3 times per day  . insulin aspart  0-9 Units Subcutaneous TID WC  . insulin glargine  20 Units Subcutaneous QHS  . sodium chloride  3 mL Intravenous Q12H  . traZODone  100 mg Oral QHS  . verapamil  240 mg Oral QHS    BMET    Component Value Date/Time   NA 145 12/01/2013  0440   K 3.7 12/01/2013 0440   CL 92* 12/01/2013 0440   CO2 35* 12/01/2013 0440   GLUCOSE 86 12/01/2013 0440   BUN 110* 12/01/2013 0440   CREATININE 10.95* 12/01/2013 0440   CALCIUM 8.2* 12/01/2013 0440   GFRNONAA 4* 12/01/2013 0440   GFRAA 5* 12/01/2013 0440   CBC    Component Value Date/Time   WBC 4.8 11/27/2013 0603   RBC 3.96* 11/27/2013 0603   HGB 11.0* 11/27/2013 0603   HCT 31.6* 11/27/2013 0603   PLT 252 11/27/2013 0603   MCV 79.8 11/27/2013 0603   MCH 27.8 11/27/2013 0603   MCHC 34.8 11/27/2013 0603   RDW 12.4 11/27/2013 0603   LYMPHSABS 1.5 11/27/2013 0603   MONOABS 0.3 11/27/2013 0603   EOSABS 0.1 11/27/2013 0603   BASOSABS 0.0 11/27/2013 0603    Assessment/Plan: 1. AKI/CKD - Considerations- Glomerulonephritis Vs Contrast exposure. FeNa- 2.4, suggestive of intrinsic renal path. But feats not consistent for either etiology, with bland urine. 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  Hx of hematuria- but ASO negative, mild C3 elevation. Hematuria also possibly from renal mass. Possible biopsy of Kidney mass and tissue for definite diagnosis. Biopsy not done yesterday due to holiday- July 4th holiday rescheduled for July 6th. Would plan for biopsy of both renal  mass and random biopsy at the same time. Cr appears to have plateaued.  2. Metabolic acidosis- Bicarb- 35 today. IV bicarb d/c 2 days ago, and oral bicarb d/c yesterday.  3. HTN- Stable, on verapamil 240mg  daily.Bp better today 4. DM- On SSI and Lantus  5. Possible RCC  6. Anemia of chronic disease- Appears stable. Anemia panel negative.   Emokpae, Ejiroghene IMTS PGY-2  I have seen and examined this patient and agree with plan as outlined by Dr. Denton Brick.  Scr increased over the last 24 hours will resume IVF's and follow.  Await renal biopsy and mass biopsy on Monday. Yashica Sterbenz A,MD 12/01/2013 12:15 PM

## 2013-12-02 LAB — RENAL FUNCTION PANEL
ALBUMIN: 3.8 g/dL (ref 3.5–5.2)
Anion gap: 18 — ABNORMAL HIGH (ref 5–15)
BUN: 104 mg/dL — AB (ref 6–23)
CO2: 32 mEq/L (ref 19–32)
CREATININE: 10.86 mg/dL — AB (ref 0.50–1.35)
Calcium: 8.1 mg/dL — ABNORMAL LOW (ref 8.4–10.5)
Chloride: 93 mEq/L — ABNORMAL LOW (ref 96–112)
GFR calc Af Amer: 5 mL/min — ABNORMAL LOW (ref >90)
GFR calc non Af Amer: 4 mL/min — ABNORMAL LOW (ref >90)
Glucose, Bld: 75 mg/dL (ref 70–99)
Phosphorus: 7.8 mg/dL — ABNORMAL HIGH (ref 2.3–4.6)
Potassium: 4 mEq/L (ref 3.7–5.3)
Sodium: 143 mEq/L (ref 137–147)

## 2013-12-02 LAB — GLUCOSE, CAPILLARY
GLUCOSE-CAPILLARY: 124 mg/dL — AB (ref 70–99)
Glucose-Capillary: 96 mg/dL (ref 70–99)
Glucose-Capillary: 130 mg/dL — ABNORMAL HIGH (ref 70–99)
Glucose-Capillary: 96 mg/dL (ref 70–99)

## 2013-12-02 NOTE — Progress Notes (Addendum)
S:VSS.  Pt feeling good today. No CP, SOB, N/V.  Good UOP (2.5L yesterday).    O:BP 129/78  Pulse 59  Temp(Src) 97.6 F (36.4 C) (Oral)  Resp 18  Ht 5' 8.5" (1.74 m)  Wt 226 lb 6.6 oz (102.7 kg)  BMI 33.92 kg/m2  SpO2 100%  Intake/Output Summary (Last 24 hours) at 12/02/13 0935 Last data filed at 12/02/13 [id number]  Gross per 24 hour  Intake    660 ml  Output   2150 ml  Net  -1490 ml   Intake/Output: I/O last 3 completed shifts: In: 29 [P.O.:720] Out: 2780 [Urine:2780]  Intake/Output this shift:  Total I/O In: 180 [P.O.:180] Out: -  Weight change: inconsistent Gen: NAD Heent: [place]/AT, EOMI CVS: RRR Resp: CTAB Abd: Obese, soft, NT/ND, +bs Ext: trace-1+ pitting pedal edema bilaterally  Recent Labs Lab 11/26/13 1840 11/27/13 0603 11/28/13 0548 11/29/13 0500 11/30/13 0503 12/01/13 0440 12/02/13 0421  NA 136* 140 136* 141 143 145 143  K 3.7 3.5* 3.4* 3.2* 3.8 3.7 4.0  CL 92* 95* 90* 88* 88* 92* 93*  CO2 16* 15* 18* 29 35* 35* 32  GLUCOSE 103* 92 128* 113* 81 86 75  BUN 138* 140* 138* 129* 117* 110* 104*  CREATININE 12.12* 12.23* 12.34* 11.14* 10.55* 10.95* 10.86*  ALBUMIN  --  4.5 4.2 4.3 4.3 4.2 3.8  CALCIUM 9.0 9.0 8.7 8.4 8.2* 8.2* 8.1*  PHOS  --   --  9.3* 7.6* 6.8* 7.9* 7.8*  AST  --  12  --   --   --   --   --   ALT  --  11  --   --   --   --   --    Liver Function Tests:  Recent Labs Lab 11/27/13 0603  11/30/13 0503 12/01/13 0440 12/02/13 0421  AST 12  --   --   --   --   ALT 11  --   --   --   --   ALKPHOS 32*  --   --   --   --   BILITOT 0.7  --   --   --   --   PROT 8.1  --   --   --   --   ALBUMIN 4.5  < > 4.3 4.2 3.8  < > = values in this interval not displayed.  CBC:  Recent Labs Lab 11/26/13 1840 11/27/13 0603  WBC 5.4 4.8  NEUTROABS 3.7 3.0  HGB 10.8* 11.0*  HCT 30.8* 31.6*  MCV 79.2 79.8  PLT 253 252   Cardiac Enzymes:  Recent Labs Lab 11/27/13 0603  CKTOTAL 118   CBG:  Recent Labs Lab 12/01/13 0830 12/01/13 1206  12/01/13 1712 12/01/13 2116 12/02/13 0850  GLUCAP 84 149* 87 118* 96    Iron Studies: No results found for this basename: IRON, TIBC, TRANSFERRIN, FERRITIN,  in the last 72 hours Studies/Results: No results found. Marland Kitchen atorvastatin  40 mg Oral q1800  . heparin  5,000 Units Subcutaneous 3 times per day  . insulin aspart  0-9 Units Subcutaneous TID WC  . insulin glargine  20 Units Subcutaneous QHS  . sodium chloride  3 mL Intravenous Q12H  . traZODone  100 mg Oral QHS  . verapamil  360 mg Oral QHS    BMET    Component Value Date/Time   NA 143 12/02/2013 0421   K 4.0 12/02/2013 0421   CL 93* 12/02/2013 0421   CO2  32 12/02/2013 0421   GLUCOSE 75 12/02/2013 0421   BUN 104* 12/02/2013 0421   CREATININE 10.86* 12/02/2013 0421   CALCIUM 8.1* 12/02/2013 0421   GFRNONAA 4* 12/02/2013 0421   GFRAA 5* 12/02/2013 0421   CBC    Component Value Date/Time   WBC 4.8 11/27/2013 0603   RBC 3.96* 11/27/2013 0603   HGB 11.0* 11/27/2013 0603   HCT 31.6* 11/27/2013 0603   PLT 252 11/27/2013 0603   MCV 79.8 11/27/2013 0603   MCH 27.8 11/27/2013 0603   MCHC 34.8 11/27/2013 0603   RDW 12.4 11/27/2013 0603   LYMPHSABS 1.5 11/27/2013 0603   MONOABS 0.3 11/27/2013 0603   EOSABS 0.1 11/27/2013 0603   BASOSABS 0.0 11/27/2013 0603    Assessment/Plan:  1. 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  Plan for biopsy of renal mass.  Cr plateaued.  2. Metabolic acidosis- resolved, bicarb 32 today.   3. HTN- stable, on verapamil 240mg  daily, cautious with decreasing HR.   4. DM- on SSI and lantus, mgmt per primary  5. 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   6. Anemia of chronic disease- stable, anemia panel wnl.  Michail Jewels, MD IMTS, PGY-2  I have seen and examined this patient and agree with plan as outlined by Dr. Gordy Levan.  Steven Perez Scr has stabilized and are awaiting renal biopsy for AKI/CKD as well as cyst bx to  r/o RCC.   Candus Braud A,MD 12/02/2013 12:10 PM

## 2013-12-02 NOTE — Progress Notes (Signed)
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 BUN, creatinine, bicarb trending down slightly.  -  Continue oral bicarb  -  Complement levels normal to mildly elevated -  ASO negative -  MPO/PR3 negative -  Anti-GBM neg -  ANA and anti-ds DNA neg -  SPEP/Urine IFE pending, but kappa/lambda both elevated and ratio elevated -  beta2-microglobulin pending -  Appreciate nephrology assistance  -  Agree with renal biopsy for diagnosis, rescheduled for Monday  Chronic anemia - probably from chronic kidney disease. 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 Diona Fanti and Dr. Marval Regal -  Biopsy on Monday  Hypokalemia, oral potassium repletion  Diet:  Renal/diabetic Access:  PIV IVF:  yes Proph:  Heparin   Code Status: full Family Communication: patient and wife Disposition Plan:  Home post renal biopsy with close outpatient nephrology    Consultants:  Nephrology  Procedures:  CXR  Antibiotics:  none   HPI/Subjective:  Feels well, no complaints  Objective: Filed Vitals:   12/01/13 1719 12/01/13 2112 12/02/13 0520 12/02/13 0851  BP: 153/74 152/86 118/75 129/78  Pulse: 56 65 71 59  Temp: 98.1 F (36.7 C) 97.9 F (36.6 C) 97.9 F (36.6 C) 97.6 F (36.4 C)  TempSrc: Oral Oral Oral Oral  Resp: 18 16 18 18   Height:      Weight:      SpO2: 100% 94% 99% 100%    Intake/Output Summary (Last 24 hours) at 12/02/13 1414 Last data filed at  12/02/13 1324  Gross per 24 hour  Intake    900 ml  Output   2150 ml  Net  -1250 ml   Filed Weights   11/28/13 2045 11/29/13 2051 12/01/13 0445  Weight: 101.2 kg (223 lb 1.7 oz) 102.8 kg (226 lb 10.1 oz) 102.7 kg (226 lb 6.6 oz)    Exam:   General:  BM, No acute distress, smiling, walking around room, very well appearing  HEENT:  NCAT, MMM  Cardiovascular:  RRR, nl S1, S2 no mrg, 2+ pulses, warm extremities  Respiratory:  CTAB, no increased WOB  Abdomen:   NABS, soft, NT/ND  MSK:   Normal tone and bulk, trace bilateral LEE  Neuro:  Grossly intact  Data Reviewed: Basic Metabolic Panel:  Recent Labs Lab 11/28/13 0548 11/29/13 0500 11/30/13 0503 12/01/13 0440 12/02/13 0421  NA 136* 141 143 145 143  K 3.4* 3.2* 3.8 3.7 4.0  CL 90* 88* 88* 92* 93*  CO2 18* 29 35* 35* 32  GLUCOSE 128* 113* 81 86 75  BUN 138* 129* 117* 110* 104*  CREATININE 12.34* 11.14* 10.55* 10.95* 10.86*  CALCIUM 8.7 8.4 8.2* 8.2* 8.1*  PHOS 9.3* 7.6* 6.8* 7.9* 7.8*   Liver Function Tests:  Recent Labs Lab 11/27/13 0603 11/28/13 0548 11/29/13 0500 11/30/13 0503 12/01/13 0440 12/02/13 0421  AST 12  --   --   --   --   --   ALT 11  --   --   --   --   --  ALKPHOS 32*  --   --   --   --   --   BILITOT 0.7  --   --   --   --   --   PROT 8.1  --   --   --   --   --   ALBUMIN 4.5 4.2 4.3 4.3 4.2 3.8   No results found for this basename: LIPASE, AMYLASE,  in the last 168 hours No results found for this basename: AMMONIA,  in the last 168 hours CBC:  Recent Labs Lab 11/26/13 1840 11/27/13 0603  WBC 5.4 4.8  NEUTROABS 3.7 3.0  HGB 10.8* 11.0*  HCT 30.8* 31.6*  MCV 79.2 79.8  PLT 253 252   Cardiac Enzymes:  Recent Labs Lab 11/27/13 0603  CKTOTAL 118   BNP (last 3 results) No results found for this basename: PROBNP,  in the last 8760 hours CBG:  Recent Labs Lab 12/01/13 1206 12/01/13 1712 12/01/13 2116 12/02/13 0850 12/02/13 1114  GLUCAP 149* 87 118* 96 130*     No results found for this or any previous visit (from the past 240 hour(s)).   Studies: No results found.  Scheduled Meds: . atorvastatin  40 mg Oral q1800  . heparin  5,000 Units Subcutaneous 3 times per day  . insulin aspart  0-9 Units Subcutaneous TID WC  . insulin glargine  20 Units Subcutaneous QHS  . sodium chloride  3 mL Intravenous Q12H  . traZODone  100 mg Oral QHS  . verapamil  360 mg Oral QHS   Continuous Infusions:    Principal Problem:   Renal failure (ARF), acute on chronic Active Problems:   Diabetes mellitus   HTN (hypertension)   Anemia   Metabolic acidosis   Acute renal failure    Time spent: 15 min    Hanne Kegg, [facility] Pager [phone]. If 7PM-7AM, please contact night-coverage at [email or web address], password [facility] 12/02/2013, 2:14 PM  LOS: 6 days

## 2013-12-03 ENCOUNTER — Inpatient Hospital Stay (HOSPITAL_COMMUNITY): Payer: 59

## 2013-12-03 DIAGNOSIS — N179 Acute kidney failure, unspecified: Secondary | ICD-10-CM | POA: Diagnosis not present

## 2013-12-03 LAB — UIFE/LIGHT CHAINS/TP QN, 24-HR UR
ALPHA 2 UR: DETECTED — AB
Albumin, U: DETECTED
Alpha 1, Urine: DETECTED — AB
Beta, Urine: DETECTED — AB
FREE LAMBDA LT CHAINS, UR: 0.43 mg/dL (ref 0.02–0.67)
Free Kappa Lt Chains,Ur: 2.86 mg/dL — ABNORMAL HIGH (ref 0.14–2.42)
Free Kappa/Lambda Ratio: 6.65 ratio (ref 2.04–10.37)
Gamma Globulin, Urine: DETECTED — AB
Total Protein, Urine: 6.8 mg/dL

## 2013-12-03 LAB — RENAL FUNCTION PANEL
Albumin: 4.1 g/dL (ref 3.5–5.2)
Anion gap: 19 — ABNORMAL HIGH (ref 5–15)
BUN: 103 mg/dL — ABNORMAL HIGH (ref 6–23)
CHLORIDE: 94 meq/L — AB (ref 96–112)
CO2: 30 meq/L (ref 19–32)
Calcium: 8.1 mg/dL — ABNORMAL LOW (ref 8.4–10.5)
Creatinine, Ser: 10.95 mg/dL — ABNORMAL HIGH (ref 0.50–1.35)
GFR, EST AFRICAN AMERICAN: 5 mL/min — AB (ref >90)
GFR, EST NON AFRICAN AMERICAN: 4 mL/min — AB (ref >90)
Glucose, Bld: 87 mg/dL (ref 70–99)
Phosphorus: 7.6 mg/dL — ABNORMAL HIGH (ref 2.3–4.6)
Potassium: 3.8 mEq/L (ref 3.7–5.3)
SODIUM: 143 meq/L (ref 137–147)

## 2013-12-03 LAB — CBC
HCT: 31.1 % — ABNORMAL LOW (ref 39.0–52.0)
HEMOGLOBIN: 10 g/dL — AB (ref 13.0–17.0)
MCH: 27.3 pg (ref 26.0–34.0)
MCHC: 32.2 g/dL (ref 30.0–36.0)
MCV: 85 fL (ref 78.0–100.0)
Platelets: 226 10*3/uL (ref 150–400)
RBC: 3.66 MIL/uL — AB (ref 4.22–5.81)
RDW: 12.2 % (ref 11.5–15.5)
WBC: 6.6 10*3/uL (ref 4.0–10.5)

## 2013-12-03 LAB — PROTEIN ELECTROPHORESIS, SERUM
ALPHA-1-GLOBULIN: 5.5 % — AB (ref 2.9–4.9)
Albumin ELP: 56.9 % (ref 55.8–66.1)
Alpha-2-Globulin: 11 % (ref 7.1–11.8)
BETA 2: 5 % (ref 3.2–6.5)
BETA GLOBULIN: 7 % (ref 4.7–7.2)
GAMMA GLOBULIN: 14.6 % (ref 11.1–18.8)
M-Spike, %: NOT DETECTED g/dL
Total Protein ELP: 7.9 g/dL (ref 6.0–8.3)

## 2013-12-03 LAB — GLUCOSE, CAPILLARY
GLUCOSE-CAPILLARY: 79 mg/dL (ref 70–99)
Glucose-Capillary: 85 mg/dL (ref 70–99)
Glucose-Capillary: 109 mg/dL — ABNORMAL HIGH (ref 70–99)
Glucose-Capillary: 171 mg/dL — ABNORMAL HIGH (ref 70–99)

## 2013-12-03 LAB — PROTIME-INR
INR: 1.06 (ref 0.00–1.49)
Prothrombin Time: 13.8 seconds (ref 11.6–15.2)

## 2013-12-03 MED ORDER — MIDAZOLAM HCL 2 MG/2ML IJ SOLN
INTRAMUSCULAR | Status: AC | PRN
  Administered 2013-12-03 (×2): 2 mg via INTRAVENOUS

## 2013-12-03 MED ORDER — HYDROCODONE-ACETAMINOPHEN 5-325 MG PO TABS: 1.0000 | ORAL_TABLET | ORAL | Status: DC | PRN

## 2013-12-03 MED ORDER — FENTANYL CITRATE 0.05 MG/ML IJ SOLN
INTRAMUSCULAR | Status: AC | PRN
  Administered 2013-12-03 (×2): 50 ug via INTRAVENOUS

## 2013-12-03 MED ORDER — FENTANYL CITRATE 0.05 MG/ML IJ SOLN
INTRAMUSCULAR | Status: AC
  Filled 2013-12-03: qty 4

## 2013-12-03 MED ORDER — MIDAZOLAM HCL 2 MG/2ML IJ SOLN
INTRAMUSCULAR | Status: AC
  Filled 2013-12-03: qty 6

## 2013-12-03 NOTE — Procedures (Signed)
Interventional Radiology Procedure Note  Procedure:  Random renal biopsy LEFT lower pole renal cortex Complications: None immediate Recommendations: - Bedrest x 4 hrs - Path pending  Signed,  Criselda Peaches, MD [facility]

## 2013-12-03 NOTE — Progress Notes (Addendum)
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 BUN, creatinine, bicarb relatively unchanged -  Complement levels normal to mildly elevated -  ASO negative -  MPO/PR3 negative -  Anti-GBM neg -  ANA and anti-ds DNA neg -  SPEP/Urine IFE with slight restriction in the kappa lane, but kappa/lambda both elevated and ratio elevated -  beta2-microglobulin pending -  Appreciate nephrology assistance  -  S/p biopsy of inferior pole of left kidney today  Chronic anemia - probably from chronic kidney disease. 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 Diona Fanti is out of office and his RN was not available today.  Spoke with a triage RN who left message for Dr. Diona Fanti to contact Mr. Domonkos next week with his recommended follow up plan.  Family aware to call clinic next Monday to schedule appointment.    Hypokalemia, oral potassium repletion  Diet:  Renal/diabetic Access:  PIV IVF:  yes Proph:  Heparin   Code Status: full Family Communication: patient and wife Disposition Plan:  Home tomorrow if CBC and renal function stable in AM   Consultants:  Nephrology  Procedures:  CXR  Antibiotics:  none   HPI/Subjective:  Feels well, no complaints.  Wants to get up to void.    Objective: Filed Vitals:   12/03/13 1059 12/03/13 1101 12/03/13 1107 12/03/13 1136  BP: 153/74 153/74 124/74 120/75  Pulse: 63 53 60 61  Temp:    97.7 F (36.5 C)   TempSrc:    Oral  Resp: 13 14 15 17   Height:      Weight:      SpO2: 92% 98% 94% 92%    Intake/Output Summary (Last 24 hours) at 12/03/13 1403 Last data filed at 12/03/13 1300  Gross per 24 hour  Intake    720 ml  Output    860 ml  Net   -140 ml   Filed Weights   11/29/13 2051 12/01/13 0445 12/02/13 2112  Weight: 102.8 kg (226 lb 10.1 oz) 102.7 kg (226 lb 6.6 oz) 99.8 kg (220 lb 0.3 oz)    Exam:   General:  BM, No acute distress, lying in bed post biopsy  HEENT:  NCAT, MMM  Cardiovascular:  RRR, nl S1, S2 no mrg, 2+ pulses, warm extremities  Respiratory:  CTAB anteriorly, no increased WOB  Abdomen:   NABS, soft, NT/ND  MSK:   Normal tone and bulk, no bilateral LEE  Neuro:  Grossly intact  Data Reviewed: Basic Metabolic Panel:  Recent Labs Lab 11/29/13 0500 11/30/13 0503 12/01/13 0440 12/02/13 0421 12/03/13 0405  NA 141 143 145 143 143  K 3.2* 3.8 3.7 4.0 3.8  CL 88* 88* 92* 93* 94*  CO2 29 35* 35* 32 30  GLUCOSE 113* 81 86 75 87  BUN 129* 117* 110* 104* 103*  CREATININE 11.14* 10.55* 10.95* 10.86* 10.95*  CALCIUM 8.4 8.2* 8.2* 8.1* 8.1*  PHOS 7.6* 6.8* 7.9* 7.8* 7.6*  Liver Function Tests:  Recent Labs Lab 11/27/13 0603  11/29/13 0500 11/30/13 0503 12/01/13 0440 12/02/13 0421 12/03/13 0405  AST 12  --   --   --   --   --   --   ALT 11  --   --   --   --   --   --   ALKPHOS 32*  --   --   --   --   --   --   BILITOT 0.7  --   --   --   --   --   --   PROT 8.1  --   --   --   --   --   --   ALBUMIN 4.5  < > 4.3 4.3 4.2 3.8 4.1  < > = values in this interval not displayed. No results found for this basename: LIPASE, AMYLASE,  in the last 168 hours No results found for this basename: AMMONIA,  in the last 168 hours CBC:  Recent Labs Lab 11/26/13 1840 11/27/13 0603 12/03/13 0405  WBC 5.4 4.8 6.6  NEUTROABS 3.7 3.0  --   HGB 10.8* 11.0* 10.0*  HCT 30.8* 31.6* 31.1*  MCV 79.2 79.8 85.0  PLT 253 252 226   Cardiac Enzymes:  Recent  Labs Lab 11/27/13 0603  CKTOTAL 118   BNP (last 3 results) No results found for this basename: PROBNP,  in the last 8760 hours CBG:  Recent Labs Lab 12/02/13 1114 12/02/13 1638 12/02/13 2109 12/03/13 0739 12/03/13 1134  GLUCAP 130* 96 124* 85 79    No results found for this or any previous visit (from the past 240 hour(s)).   Studies: US Biopsy  12/03/2013   CLINICAL DATA:  63 year old male with progressive chronic kidney disease. Random renal biopsy is requested for further evaluation.  EXAM: ULTRASOUND BIOPSY CORE RENAL  Date: 12/03/2013  PROCEDURE: 1. Ultrasound-guided biopsy of the left kidney Interventional Radiologist:  Criselda Peaches, MD  ANESTHESIA/SEDATION: Moderate (conscious) sedation was used. Four mg Versed, 100 mcg Fentanyl were administered intravenously. The patient's vital signs were monitored continuously by radiology nursing throughout the procedure.  Sedation Time: 13 minutes  TECHNIQUE: Informed consent was obtained from the patient following explanation of the procedure, risks, benefits and alternatives. The patient understands, agrees and consents for the procedure. All questions were addressed. A time out was performed.  The left and right flanks were interrogated with ultrasound. The left kidney is lower lying and more accessible for random renal biopsy. An appropriate skin entry site was selected and marked. The region was then sterilely prepped and draped in the usual fashion using Betadine skin prep.  Local anesthesia was attained by infiltration with 1% lidocaine. A small dermatotomy was made. Under real-time sonographic guidance, a 15 gauge introducer needle was advanced through the left retroperitoneum and positioned just outside the lower pole cortex of the left kidney. Two 16 gauge core biopsies were then coaxially obtained using the Bard automated biopsy device. Real-time sonographic imaging was used for all biopsies and placement of the needle within the  lower pole cortex was confirmed.  The coaxial needle and biopsy device were removed. Post biopsy ultrasound imaging demonstrates trace perinephric hematoma. No expanding fluid or evidence of active hemorrhage on color Doppler imaging. The patient tolerated the procedure very well.  COMPLICATIONS: None immediate  IMPRESSION: 1. Technically successful ultrasound-guided random biopsy of the lower pole cortex of the left kidney. Regarding the 1.7 cm complex cystic lesion  in the medial aspect of the upper pole of the left kidney: Urological consultation is recommended. If the patient is not a candidate for laparoscopic or robotic assisted partial nephrectomy due to worsening renal function, percutaneous thermal ablation may be a good option.  Signed,  Criselda Peaches, MD  [facility]  [facility]   Electronically Signed   By: Jacqulynn Cadet M.D.   On: 12/03/2013 11:35    Scheduled Meds: . atorvastatin  40 mg Oral q1800  . fentaNYL      . heparin  5,000 Units Subcutaneous 3 times per day  . insulin aspart  0-9 Units Subcutaneous TID WC  . insulin glargine  20 Units Subcutaneous QHS  . midazolam      . sodium chloride  3 mL Intravenous Q12H  . traZODone  100 mg Oral QHS  . verapamil  360 mg Oral QHS   Continuous Infusions:    Principal Problem:   Renal failure (ARF), acute on chronic Active Problems:   Diabetes mellitus   HTN (hypertension)   Anemia   Metabolic acidosis   Acute renal failure    Time spent: 15 min    Jamill Wetmore, [facility] Pager [phone]. If 7PM-7AM, please contact night-coverage at [email or web address], password [email or web address] 12/03/2013, 2:03 PM  LOS: 7 days

## 2013-12-03 NOTE — Progress Notes (Signed)
I have personally seen and examined this patient and agree with the assessment/plan as outlined above by Denton Brick MD (PGY2). Renal function appears to have reached a plateau- BUN improving and UOP fair- no acute HD needs noted. Renal biopsy done today.  Ezekial Arns K.,MD 12/03/2013 1:05 PM

## 2013-12-03 NOTE — Progress Notes (Signed)
S: No nausea or vomiting. Tolerating oral diet. No shortness breath or leg swelling. NPO for procedure today.  O:BP 122/65  Pulse 67  Temp(Src) 97.5 F (36.4 C) (Oral)  Resp 18  Ht 5' 8.5" (1.74 m)  Wt 220 lb 0.3 oz (99.8 kg)  BMI 32.96 kg/m2  SpO2 100%  Intake/Output Summary (Last 24 hours) at 12/03/13 0919 Last data filed at 12/02/13 1821  Gross per 24 hour  Intake    480 ml  Output    760 ml  Net   -280 ml   Intake/Output: I/O last 3 completed shifts: In: 660 [P.O.:660] Out: 1760 [Urine:1760]  Intake/Output this shift:    Weight change:  Gen: NAD, wife at bedside, sitting in couch. Heent: [place]/AT, EOMI, mildly dry oral mucosa. CVS: Regular, no added sounds Resp: CTAB Abd: Obese, soft, NT/ND, +bs Ext: trace-1+ pitting pedal edema bilaterally  Recent Labs Lab 11/26/13 1840 11/27/13 0603 11/28/13 0548 11/29/13 0500 11/30/13 0503 12/01/13 0440 12/02/13 0421 12/03/13 0405  NA 136* 140 136* 141 143 145 143 143  K 3.7 3.5* 3.4* 3.2* 3.8 3.7 4.0 3.8  CL 92* 95* 90* 88* 88* 92* 93* 94*  CO2 16* 15* 18* 29 35* 35* 32 30  GLUCOSE 103* 92 128* 113* 81 86 75 87  BUN 138* 140* 138* 129* 117* 110* 104* 103*  CREATININE 12.12* 12.23* 12.34* 11.14* 10.55* 10.95* 10.86* 10.95*  ALBUMIN  --  4.5 4.2 4.3 4.3 4.2 3.8 4.1  CALCIUM 9.0 9.0 8.7 8.4 8.2* 8.2* 8.1* 8.1*  PHOS  --   --  9.3* 7.6* 6.8* 7.9* 7.8* 7.6*  AST  --  12  --   --   --   --   --   --   ALT  --  11  --   --   --   --   --   --    Liver Function Tests:  Recent Labs Lab 11/27/13 0603  12/01/13 0440 12/02/13 0421 12/03/13 0405  AST 12  --   --   --   --   ALT 11  --   --   --   --   ALKPHOS 32*  --   --   --   --   BILITOT 0.7  --   --   --   --   PROT 8.1  --   --   --   --   ALBUMIN 4.5  < > 4.2 3.8 4.1  < > = values in this interval not displayed.  CBC:  Recent Labs Lab 11/26/13 1840 11/27/13 0603 12/03/13 0405  WBC 5.4 4.8 6.6  NEUTROABS 3.7 3.0  --   HGB 10.8* 11.0* 10.0*  HCT 30.8*  31.6* 31.1*  MCV 79.2 79.8 85.0  PLT 253 252 226   Cardiac Enzymes:  Recent Labs Lab 11/27/13 0603  CKTOTAL 118   CBG:  Recent Labs Lab 12/02/13 0850 12/02/13 1114 12/02/13 1638 12/02/13 2109 12/03/13 0739  GLUCAP 96 130* 96 124* 85    Iron Studies: No results found for this basename: IRON, TIBC, TRANSFERRIN, FERRITIN,  in the last 72 hours Studies/Results: No results found. Marland Kitchen atorvastatin  40 mg Oral q1800  . heparin  5,000 Units Subcutaneous 3 times per day  . insulin aspart  0-9 Units Subcutaneous TID WC  . insulin glargine  20 Units Subcutaneous QHS  . sodium chloride  3 mL Intravenous Q12H  . traZODone  100 mg Oral QHS  .  verapamil  360 mg Oral QHS    BMET    Component Value Date/Time   NA 143 12/03/2013 0405   K 3.8 12/03/2013 0405   CL 94* 12/03/2013 0405   CO2 30 12/03/2013 0405   GLUCOSE 87 12/03/2013 0405   BUN 103* 12/03/2013 0405   CREATININE 10.95* 12/03/2013 0405   CALCIUM 8.1* 12/03/2013 0405   GFRNONAA 4* 12/03/2013 0405   GFRAA 5* 12/03/2013 0405   CBC    Component Value Date/Time   WBC 6.6 12/03/2013 0405   RBC 3.66* 12/03/2013 0405   HGB 10.0* 12/03/2013 0405   HCT 31.1* 12/03/2013 0405   PLT 226 12/03/2013 0405   MCV 85.0 12/03/2013 0405   MCH 27.3 12/03/2013 0405   MCHC 32.2 12/03/2013 0405   RDW 12.2 12/03/2013 0405   LYMPHSABS 1.5 11/27/2013 0603   MONOABS 0.3 11/27/2013 0603   EOSABS 0.1 11/27/2013 0603   BASOSABS 0.0 11/27/2013 0603    Assessment/Plan:  1. 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 UA not suggestive.  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  Plan for biopsy of renal mass today.  Cr plateaued, 10.95 today. Protein electrophoresis still in process.  2. Metabolic acidosis- resolved, bicarb 30 today.   3. HTN- stable, on verapamil 240mg  daily, cautious with decreasing HR.   4. DM- on SSI and lantus, mgmt per primary  5. 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   6. Anemia of  chronic disease- stable, anemia panel wnl.  Jenetta Downer, MD IMTS, PGY-2

## 2013-12-03 NOTE — Discharge Summary (Addendum)
Physician Discharge Summary  Steven Perez [id number] DOB: 07/25/1950 DOA: 11/26/2013  PCP: Glo Herring., MD  Admit date: 11/26/2013 Discharge date: 12/04/2013  Recommendations for Outpatient Follow-up:  1. F/u with nephrology for labs on 7/9 and then with Dr. Posey Pronto at time below 2. F/u with urology, Dr. Diona Fanti will have his office call the family with appointment time and date for follow up  3. PCP:  Either please repeat SPEP/UPEP/IFE in about a year, or, if you prefer, refer to hematology/oncology for possibly MGUS.    Discharge Diagnoses:  Principal Problem:   Renal failure (ARF), acute on chronic Active Problems:   Diabetes mellitus   HTN (hypertension)   Anemia   Metabolic acidosis   Acute renal failure   Discharge Condition: stable, improved  Diet recommendation:  Diabetic, healthy heart  Wt Readings from Last 3 Encounters:  12/03/13 99.6 kg (219 lb 9.3 oz)  01/04/12 108.863 kg (240 lb)    History of present illness:  Steven Perez is a 63 y.o. male with history of chronic kidney disease, diabetes mellitus, hyperlipidemia, hypertension was referred to the ER as patient's labs done at patient's primary care office was found to have increased creatinine from baseline. Labs done in the ER shows creatinine of 12 which has increased from 9.8 four days ago. Patient denies any nausea vomiting abdominal pain diarrhea. Denies any chest pain or shortness of breath. Has been having generalized weakness and fatigue. On-call nephrologist was consulted by ER physician and at this time patient will be admitted for further management. Patient states that he still makes urine. He has had hematuria last month and had MRI which showed renal cysts for which nephrologist is following up. Patient states that subsequent urinalysis has not shown any further blood in the urine.   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  See below for AKI labs.  His BUN, creatinine remained approximately stable.  His bicarb was initially low and he was started on oral and IV bicarbonate supplementation and his bicarb trended up.  He underwent RUS with biopsy on 7/6 without complication, pathology report is pending.  He will follow up with Nephrology next week.   - Complement levels normal to mildly elevated  - ASO negative  - MPO/PR3 negative  - Anti-GBM neg  - ANA and anti-ds DNA neg  - SPEP/Urine IFE with slight restriction in the kappa lane, but kappa/lambda both elevated and ratio elevated  - beta2-microglobulin pending  - Appreciate nephrology assistance  - S/p biopsy of inferior pole of left kidney today  - Continue oral bicarb   Chronic anemia - probably from chronic kidney disease.  Hemoglobin remained stable near 10mg /dl.  Iron studies wnl, B12 542, folate 691, TSH 1.63.    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  Consider referral to hematology/oncology.    Diabetes mellitus 2 - CBG well controlled, Continue lantus 20 units with low dose SSI   Hypertension, BP were elevated so his verapamil was increased.    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  Dr. Diona Fanti will have his office call the family to schedule an appointment time and day.    Hypokalemia, oral potassium repletion   Consultants:  Nephrology Procedures:  CXR RUS  with biopsy of left kidney inferior pole (not mass) on 7/6 Antibiotics:  none    Discharge Exam: Filed Vitals:   12/04/13 0900  BP: 137/80  Pulse: 60  Temp: 97.7 F (36.5 C)  Resp: 17   Filed Vitals:   12/03/13 1700 12/03/13 2104 12/04/13 0521 12/04/13 0900  BP: 163/81 139/69 105/57 137/80  Pulse: 60 61 60 60  Temp: 98.2 F (36.8 C) 97.8 F (36.6 C) 97.9 F (36.6 C) 97.7  F (36.5 C)  TempSrc: Oral Oral Oral Oral  Resp: 18 18 18 17   Height:      Weight:  99.6 kg (219 lb 9.3 oz)    SpO2: 97% 96% 100% 98%    General: BM, No acute distress, feels ready to go home HEENT: NCAT, MMM  Cardiovascular: RRR, nl S1, S2 no mrg, 2+ pulses, warm extremities  Respiratory: CTAB anteriorly, no increased WOB  Abdomen: NABS, soft, NT/ND  MSK: Normal tone and bulk, no bilateral LEE  Neuro: Grossly intact   Discharge Instructions      Discharge Instructions   Call MD for:  difficulty breathing, headache or visual disturbances    Complete by:  As directed      Call MD for:  extreme fatigue    Complete by:  As directed      Call MD for:  hives    Complete by:  As directed      Call MD for:  persistant dizziness or light-headedness    Complete by:  As directed      Call MD for:  persistant nausea and vomiting    Complete by:  As directed      Call MD for:  redness, tenderness, or signs of infection (pain, swelling, redness, odor or green/yellow discharge around incision site)    Complete by:  As directed      Call MD for:  severe uncontrolled pain    Complete by:  As directed      Call MD for:  temperature >100.4    Complete by:  As directed      Diet - low sodium heart healthy    Complete by:  As directed      Discharge instructions    Complete by:  As directed   You were hospitalized because your kidneys have not been working well.  Your kidneys continue to not work very well, however, they have stabilized.  You had a kidney biopsy on 7/6 and the results will be back either later this week or early next week.  Please follow up for blood work on 7/9 and then per the instructions of Dr. Posey Pronto to review the results of your biopsy.  For your kidney mass, Dr. Diona Fanti will have his office call you to schedule a repeat biopsy of the mass.  If you haven't heard from them by Monday of next week, please call them.  Please do NOT take ibuprofen, naprosyn, aleve, goody's  powders, high dose aspirin.  If you need pain medication, TYLENOL IS SAFE.     Increase activity slowly    Complete by:  As directed             Medication List    STOP taking these medications       verapamil 240 MG (CO) 24 hr tablet  Commonly known as:  COVERA HS  Replaced by:  verapamil 180 MG CR tablet      TAKE these medications       acetaminophen  500 MG tablet  Commonly known as:  TYLENOL  Take 500 mg by mouth at bedtime as needed. Pain     insulin glargine 100 UNIT/ML injection  Commonly known as:  LANTUS  Inject 20 Units into the skin at bedtime.     simvastatin 80 MG tablet  Commonly known as:  ZOCOR  Take 40 mg by mouth at bedtime.     tadalafil 10 MG tablet  Commonly known as:  CIALIS  Take 10 mg by mouth daily as needed. Sexual Arousal     traZODone 50 MG tablet  Commonly known as:  DESYREL  Take 100 mg by mouth at bedtime.     verapamil 180 MG CR tablet  Commonly known as:  CALAN-SR  Take 2 tablets (360 mg total) by mouth at bedtime.       Follow-up Information   Follow up with Glo Herring., MD. Schedule an appointment as soon as possible for a visit in 2 weeks.   Specialty:  Internal Medicine   Contact information:   [address] [place] [phone]       Follow up with Jorja Loa, MD. Call in 1 week.   Specialty:  Urology   Contact information:   [place] [address] [phone]       Follow up with Ulla Potash., MD On 12/12/2013. (at 12pm. Labs on 12/06/13 (This thursday))    Specialty:  Nephrology   Contact information:   [place] [address] [phone]       The results of significant diagnostics from this hospitalization (including imaging, microbiology, ancillary and laboratory) are listed below for reference.    Significant Diagnostic Studies: Dg Chest 1 View  11/26/2013   CLINICAL DATA:  Acute renal failure  EXAM: CHEST - 1 VIEW  COMPARISON:  None.   FINDINGS: The heart size and mediastinal contours are within normal limits. Both lungs are clear. The visualized skeletal structures are unremarkable.  IMPRESSION: No active disease.   Electronically Signed   By: Kathreen Devoid   On: 11/26/2013 21:52   Mr Abdomen Wo Contrast  11/22/2013   CLINICAL DATA:  Evaluate left renal lesion.  EXAM: MRI ABDOMEN WITHOUT CONTRAST  TECHNIQUE: Multiplanar multisequence MR imaging was performed without the administration of intravenous contrast.  COMPARISON:  CT scan 10/31/2013  FINDINGS: Unfortunately, the patient could not have IV contrast because the GFR was too low.  As demonstrated on the CT scan there is a 17.5 mm mid pole complex cystic left renal lesion located posteriorly. There are septations and on the CT scan there appears to be areas of enhancement. This is certainly worrisome for a cystic renal neoplasm. Recommend biopsy or consideration for RF ablation. The MR without contrast cannot add any higher degree of diagnostic confidence.  The liver is unremarkable and stable. No hepatic lesions or biliary dilatation. The gallbladder is normal. Normal caliber and course of the common bowel duct. The pancreatic duct is normal. The pancreas is normal. The spleen is normal in size. No splenic lesions. The adrenal glands are unremarkable.  Both kidneys demonstrates simple appearing cysts. No hydronephrosis or perinephric process.  No mesenteric or retroperitoneal mass or adenopathy. Stable upper abdominal lymph nodes.  IMPRESSION: 17.5 mm complex cystic left renal lesion. The MR examination without contrast unfortunately does not add any further information. This lesion does appear to enhance on the CT scan is worrisome for a cystic renal neoplasm. Recommend consideration for biopsy  or RF ablation.   Electronically Signed   By: Kalman Jewels M.D.   On: 11/22/2013 09:25    Microbiology: No results found for this or any previous visit (from the past 240 hour(s)).    Labs: Basic Metabolic Panel:  Recent Labs Lab 11/30/13 0503 12/01/13 0440 12/02/13 0421 12/03/13 0405 12/04/13 1045  NA 143 145 143 143 143  K 3.8 3.7 4.0 3.8 4.6  CL 88* 92* 93* 94* 95*  CO2 35* 35* 32 30 29  GLUCOSE 81 86 75 87 118*  BUN 117* 110* 104* 103* 98*  CREATININE 10.55* 10.95* 10.86* 10.95* 10.25*  CALCIUM 8.2* 8.2* 8.1* 8.1* 8.6  PHOS 6.8* 7.9* 7.8* 7.6* 6.5*   Liver Function Tests:  Recent Labs Lab 11/30/13 0503 12/01/13 0440 12/02/13 0421 12/03/13 0405 12/04/13 1045  ALBUMIN 4.3 4.2 3.8 4.1 4.2   No results found for this basename: LIPASE, AMYLASE,  in the last 168 hours No results found for this basename: AMMONIA,  in the last 168 hours CBC:  Recent Labs Lab 12/03/13 0405 12/04/13 1045  WBC 6.6 5.8  HGB 10.0* 10.2*  HCT 31.1* 31.1*  MCV 85.0 84.3  PLT 226 235   Cardiac Enzymes: No results found for this basename: CKTOTAL, CKMB, CKMBINDEX, TROPONINI,  in the last 168 hours BNP: BNP (last 3 results) No results found for this basename: PROBNP,  in the last 8760 hours CBG:  Recent Labs Lab 12/03/13 0739 12/03/13 1134 12/03/13 1714 12/03/13 2102 12/04/13 0750  GLUCAP 85 79 109* 171* 71    Time coordinating discharge: 45 minutes  Signed:  Onis Markoff  [facility] 12/04/2013, 11:37 AM

## 2013-12-04 DIAGNOSIS — I150 Renovascular hypertension: Secondary | ICD-10-CM

## 2013-12-04 LAB — RENAL FUNCTION PANEL
ALBUMIN: 4.2 g/dL (ref 3.5–5.2)
Anion gap: 19 — ABNORMAL HIGH (ref 5–15)
BUN: 98 mg/dL — ABNORMAL HIGH (ref 6–23)
CALCIUM: 8.6 mg/dL (ref 8.4–10.5)
CO2: 29 mEq/L (ref 19–32)
CREATININE: 10.25 mg/dL — AB (ref 0.50–1.35)
Chloride: 95 mEq/L — ABNORMAL LOW (ref 96–112)
GFR calc Af Amer: 5 mL/min — ABNORMAL LOW (ref >90)
GFR, EST NON AFRICAN AMERICAN: 5 mL/min — AB (ref >90)
Glucose, Bld: 118 mg/dL — ABNORMAL HIGH (ref 70–99)
PHOSPHORUS: 6.5 mg/dL — AB (ref 2.3–4.6)
Potassium: 4.6 mEq/L (ref 3.7–5.3)
Sodium: 143 mEq/L (ref 137–147)

## 2013-12-04 LAB — GLUCOSE, CAPILLARY
Glucose-Capillary: 71 mg/dL (ref 70–99)
Glucose-Capillary: 83 mg/dL (ref 70–99)

## 2013-12-04 LAB — CBC
HCT: 31.1 % — ABNORMAL LOW (ref 39.0–52.0)
Hemoglobin: 10.2 g/dL — ABNORMAL LOW (ref 13.0–17.0)
MCH: 27.6 pg (ref 26.0–34.0)
MCHC: 32.8 g/dL (ref 30.0–36.0)
MCV: 84.3 fL (ref 78.0–100.0)
PLATELETS: 235 10*3/uL (ref 150–400)
RBC: 3.69 MIL/uL — AB (ref 4.22–5.81)
RDW: 12 % (ref 11.5–15.5)
WBC: 5.8 10*3/uL (ref 4.0–10.5)

## 2013-12-04 MED ORDER — VERAPAMIL HCL ER 180 MG PO TBCR: 360.0000 mg | EXTENDED_RELEASE_TABLET | Freq: Every day | ORAL | Status: DC

## 2013-12-04 NOTE — Progress Notes (Signed)
I have personally seen and examined this patient and agree with the assessment/plan as outlined above by Denton Brick MD (PGY2). Plan for DC later today after labs reviewed. No acute (clinical) HD needs. No indication of urinary bleeding. Shantele Reller K.,MD 12/04/2013 10:15 AM

## 2013-12-04 NOTE — Progress Notes (Signed)
S: Had random renal biopsy yesterday. Tolerated procedure well. No tenderness, pain or dizziness. No nausea or vomiting. No SOB. Labs have not been drawn this morning, pt says the phlebotomist was unsuccessful.  O:BP 105/57  Pulse 60  Temp(Src) 97.9 F (36.6 C) (Oral)  Resp 18  Ht 5' 8.5" (1.74 m)  Wt 219 lb 9.3 oz (99.6 kg)  BMI 32.90 kg/m2  SpO2 100%  Intake/Output Summary (Last 24 hours) at 12/04/13 0817 Last data filed at 12/03/13 1500  Gross per 24 hour  Intake    720 ml  Output   1100 ml  Net   -380 ml   Intake/Output: I/O last 3 completed shifts: In: 720 [P.O.:720] Out: 1100 [Urine:1100]  Intake/Output this shift:    Weight change: -7.1 oz (-0.2 kg)  Physical Exam-  Vitals noted. Gen: Pleasant gentle man, in no distress. Heent: [place]/AT, EOMI, mildly dry oral mucosa. CVS: Regular, no added sounds Resp: Normal work of breathing, no crackles Abd: Obese, soft, NT/ND, +bs Ext: trace-1+ pitting pedal edema bilaterally  Recent Labs Lab 11/28/13 0548 11/29/13 0500 11/30/13 0503 12/01/13 0440 12/02/13 0421 12/03/13 0405  NA 136* 141 143 145 143 143  K 3.4* 3.2* 3.8 3.7 4.0 3.8  CL 90* 88* 88* 92* 93* 94*  CO2 18* 29 35* 35* 32 30  GLUCOSE 128* 113* 81 86 75 87  BUN 138* 129* 117* 110* 104* 103*  CREATININE 12.34* 11.14* 10.55* 10.95* 10.86* 10.95*  ALBUMIN 4.2 4.3 4.3 4.2 3.8 4.1  CALCIUM 8.7 8.4 8.2* 8.2* 8.1* 8.1*  PHOS 9.3* 7.6* 6.8* 7.9* 7.8* 7.6*   Liver Function Tests:  Recent Labs Lab 12/01/13 0440 12/02/13 0421 12/03/13 0405  ALBUMIN 4.2 3.8 4.1    CBC:  Recent Labs Lab 12/03/13 0405  WBC 6.6  HGB 10.0*  HCT 31.1*  MCV 85.0  PLT 226   CBG:  Recent Labs Lab 12/03/13 0739 12/03/13 1134 12/03/13 1714 12/03/13 2102 12/04/13 0750  GLUCAP 85 79 109* 171* 71   Studies/Results: US Biopsy  12/03/2013   CLINICAL DATA:  63 year old male with progressive chronic kidney disease. Random renal biopsy is requested for further evaluation.   EXAM: ULTRASOUND BIOPSY CORE RENAL  Date: 12/03/2013  PROCEDURE: 1. Ultrasound-guided biopsy of the left kidney Interventional Radiologist:  Criselda Peaches, MD  ANESTHESIA/SEDATION: Moderate (conscious) sedation was used. Four mg Versed, 100 mcg Fentanyl were administered intravenously. The patient's vital signs were monitored continuously by radiology nursing throughout the procedure.  Sedation Time: 13 minutes  TECHNIQUE: Informed consent was obtained from the patient following explanation of the procedure, risks, benefits and alternatives. The patient understands, agrees and consents for the procedure. All questions were addressed. A time out was performed.  The left and right flanks were interrogated with ultrasound. The left kidney is lower lying and more accessible for random renal biopsy. An appropriate skin entry site was selected and marked. The region was then sterilely prepped and draped in the usual fashion using Betadine skin prep.  Local anesthesia was attained by infiltration with 1% lidocaine. A small dermatotomy was made. Under real-time sonographic guidance, a 15 gauge introducer needle was advanced through the left retroperitoneum and positioned just outside the lower pole cortex of the left kidney. Two 16 gauge core biopsies were then coaxially obtained using the Bard automated biopsy device. Real-time sonographic imaging was used for all biopsies and placement of the needle within the lower pole cortex was confirmed.  The coaxial needle and biopsy device were  removed. Post biopsy ultrasound imaging demonstrates trace perinephric hematoma. No expanding fluid or evidence of active hemorrhage on color Doppler imaging. The patient tolerated the procedure very well.  COMPLICATIONS: None immediate  IMPRESSION: 1. Technically successful ultrasound-guided random biopsy of the lower pole cortex of the left kidney. Regarding the 1.7 cm complex cystic lesion in the medial aspect of the upper pole of the  left kidney: Urological consultation is recommended. If the patient is not a candidate for laparoscopic or robotic assisted partial nephrectomy due to worsening renal function, percutaneous thermal ablation may be a good option.  Signed,  Criselda Peaches, MD  [facility]  [facility]   Electronically Signed   By: Jacqulynn Cadet M.D.   On: 12/03/2013 11:35   . atorvastatin  40 mg Oral q1800  . heparin  5,000 Units Subcutaneous 3 times per day  . insulin aspart  0-9 Units Subcutaneous TID WC  . insulin glargine  20 Units Subcutaneous QHS  . sodium chloride  3 mL Intravenous Q12H  . traZODone  100 mg Oral QHS  . verapamil  360 mg Oral QHS    BMET    Component Value Date/Time   NA 143 12/03/2013 0405   K 3.8 12/03/2013 0405   CL 94* 12/03/2013 0405   CO2 30 12/03/2013 0405   GLUCOSE 87 12/03/2013 0405   BUN 103* 12/03/2013 0405   CREATININE 10.95* 12/03/2013 0405   CALCIUM 8.1* 12/03/2013 0405   GFRNONAA 4* 12/03/2013 0405   GFRAA 5* 12/03/2013 0405   CBC    Component Value Date/Time   WBC 6.6 12/03/2013 0405   RBC 3.66* 12/03/2013 0405   HGB 10.0* 12/03/2013 0405   HCT 31.1* 12/03/2013 0405   PLT 226 12/03/2013 0405   MCV 85.0 12/03/2013 0405   MCH 27.3 12/03/2013 0405   MCHC 32.2 12/03/2013 0405   RDW 12.2 12/03/2013 0405   LYMPHSABS 1.5 11/27/2013 0603   MONOABS 0.3 11/27/2013 0603   EOSABS 0.1 11/27/2013 0603   BASOSABS 0.0 11/27/2013 0603    Assessment/Plan:  1. 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 UA not suggestive. Unlikely CIN considering duration and severity. Serum protein electrophoresis- Unremarkable except for mild elevation of Alpha-1-Globulin of 5.5. Random renal biopsy done yesterday, Pathology results pending. Renal Mass not biopsied, Radiologist said biopsy of the mass and renal lesion is too high risk, and recommended doing them at separate times.  Awaiting Renal function panel for today.  2. Metabolic acidosis- Resolved.  3. HTN-  On the low side this morning. Bp meds- verapamil 240mg  daily. Monitor with CBC considering pt had a renal biopsy yesterday. CBC this morning pending.  4. DM- on SSI and lantus, mgmt per primary  5. 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  6. Anemia of chronic disease- stable, anemia panel wnl. CBC today pending.  Jenetta Downer, MD IMTS, PGY-2

## 2013-12-04 NOTE — Progress Notes (Signed)
Patient ID: Steven Perez, male   DOB: [birth year], 63 y.o.   MRN: [id number] Pt discharged home per MD. Discharge instructions reviewed with patient and spouse. Pt verbalized understanding to f/u with various MDs as recommended. Pt verbalized understanding to pick up medication from pharmacy stating pharmacy had already notified him that medication was ready. NSL out with cath intact. Pt declined escort via staff and wheelchair preferring to walk out on his own 2 feet accompanied by spouse. Manya Silvas, RN

## 2013-12-05 NOTE — ED Provider Notes (Signed)
CSN: [id number]     Arrival date & time 11/26/13  1630 History   First MD Initiated Contact with Patient 11/26/13 1747     Chief Complaint  Patient presents with  . Acute Renal Failure     (Consider location/radiation/quality/duration/timing/severity/associated sxs/prior Treatment) HPI Comments: 63 y.o. male with history of chronic kidney disease, diabetes mellitus, hyperlipidemia, hypertension was referred to the ER as patient's labs done at patient's primary care office was found to have increased creatinine from baseline. Pt noted increased weakness and some dib with exertion. Has hx of DM, HTN. No new meds. No diarrha, emesis.  The history is provided by the patient.    Past Medical History  Diagnosis Date  . Diabetes mellitus   . Hypertension   . HLD (hyperlipidemia)    Past Surgical History  Procedure Laterality Date  . Tonsillectomy     Family History  Problem Relation Age of Onset  . Diabetes Mellitus II Mother   . CAD Neg Hx   . Stroke Neg Hx    History  Substance Use Topics  . Smoking status: Former Research scientist (life sciences)  . Smokeless tobacco: Not on file  . Alcohol Use: Yes     Comment: occasionally    Review of Systems  Constitutional: Positive for fatigue. Negative for activity change and appetite change.  Respiratory: Positive for shortness of breath. Negative for cough.   Cardiovascular: Negative for chest pain.  Gastrointestinal: Negative for abdominal pain.  Genitourinary: Negative for dysuria.      Allergies  Tetanus toxoids  Home Medications   Prior to Admission medications   Medication Sig Start Date End Date Taking? Authorizing Provider  acetaminophen (TYLENOL) 500 MG tablet Take 500 mg by mouth at bedtime as needed. Pain   Yes Historical Provider, MD  insulin glargine (LANTUS) 100 UNIT/ML injection Inject 20 Units into the skin at bedtime.   Yes Historical Provider, MD  simvastatin (ZOCOR) 80 MG tablet Take 40 mg by mouth at bedtime.   Yes Historical  Provider, MD  traZODone (DESYREL) 50 MG tablet Take 100 mg by mouth at bedtime.   Yes Historical Provider, MD  tadalafil (CIALIS) 10 MG tablet Take 10 mg by mouth daily as needed. Sexual Arousal    Historical Provider, MD  verapamil (CALAN-SR) 180 MG CR tablet Take 2 tablets (360 mg total) by mouth at bedtime. 12/04/13   Janece Canterbury, MD   BP 137/80  Pulse 60  Temp(Src) 97.7 F (36.5 C) (Oral)  Resp 17  Ht 5' 8.5" (1.74 m)  Wt 219 lb 9.3 oz (99.6 kg)  BMI 32.90 kg/m2  SpO2 98% Physical Exam  Nursing note and vitals reviewed. Constitutional: He is oriented to person, place, and time. He appears well-developed.  HENT:  Head: Normocephalic and atraumatic.  Eyes: Conjunctivae and EOM are normal. Pupils are equal, round, and reactive to light.  Neck: Normal range of motion. Neck supple.  Cardiovascular: Normal rate and regular rhythm.   Pulmonary/Chest: Effort normal and breath sounds normal.  Abdominal: Soft. Bowel sounds are normal. He exhibits no distension. There is no tenderness. There is no rebound and no guarding.  Neurological: He is alert and oriented to person, place, and time.  Skin: Skin is warm.    ED Course  Procedures (including critical care time) Labs Review Labs Reviewed  CBC WITH DIFFERENTIAL - Abnormal; Notable for the following:    RBC 3.89 (*)    Hemoglobin 10.8 (*)    HCT 30.8 (*)  All other components within normal limits  BASIC METABOLIC PANEL - Abnormal; Notable for the following:    Sodium 136 (*)    Chloride 92 (*)    CO2 16 (*)    Glucose, Bld 103 (*)    BUN 138 (*)    Creatinine, Ser 12.12 (*)    GFR calc non Af Amer 4 (*)    GFR calc Af Amer 4 (*)    All other components within normal limits  URINALYSIS, ROUTINE W REFLEX MICROSCOPIC - Abnormal; Notable for the following:    Leukocytes, UA TRACE (*)    All other components within normal limits  URINE MICROSCOPIC-ADD ON - Abnormal; Notable for the following:    Squamous Epithelial / LPF  FEW (*)    All other components within normal limits  COMPREHENSIVE METABOLIC PANEL - Abnormal; Notable for the following:    Potassium 3.5 (*)    Chloride 95 (*)    CO2 15 (*)    BUN 140 (*)    Creatinine, Ser 12.23 (*)    Alkaline Phosphatase 32 (*)    GFR calc non Af Amer 4 (*)    GFR calc Af Amer 4 (*)    All other components within normal limits  CBC WITH DIFFERENTIAL - Abnormal; Notable for the following:    RBC 3.96 (*)    Hemoglobin 11.0 (*)    HCT 31.6 (*)    All other components within normal limits  PROTEIN ELECTROPHORESIS, SERUM - Abnormal; Notable for the following:    Alpha-1-Globulin 5.5 (*)    All other components within normal limits  IMMUNOFIXATION ELECTROPHORESIS, URINE (WITH TOT PROT) - Abnormal; Notable for the following:    Alpha 1, Urine DETECTED (*)    Alpha 2, Urine DETECTED (*)    Beta, Urine DETECTED (*)    Gamma Globulin, Urine DETECTED (*)    Free Kappa Lt Chains,Ur 2.86 (*)    All other components within normal limits  KAPPA/LAMBDA LIGHT CHAINS - Abnormal; Notable for the following:    Kappa free light chain 5.79 (*)    Lamda free light chains 2.90 (*)    Kappa, lamda light chain ratio 2.00 (*)    All other components within normal limits  C4 COMPLEMENT - Abnormal; Notable for the following:    Complement C4, Body Fluid 53 (*)    All other components within normal limits  COMPLEMENT, TOTAL - Abnormal; Notable for the following:    Compl, Total (CH50) >60 (*)    All other components within normal limits  GLUCOSE, CAPILLARY - Abnormal; Notable for the following:    Glucose-Capillary 119 (*)    All other components within normal limits  RENAL FUNCTION PANEL - Abnormal; Notable for the following:    Sodium 136 (*)    Potassium 3.4 (*)    Chloride 90 (*)    CO2 18 (*)    Glucose, Bld 128 (*)    BUN 138 (*)    Creatinine, Ser 12.34 (*)    Phosphorus 9.3 (*)    GFR calc non Af Amer 4 (*)    GFR calc Af Amer 4 (*)    All other components  within normal limits  GLUCOSE, CAPILLARY - Abnormal; Notable for the following:    Glucose-Capillary 175 (*)    All other components within normal limits  GLUCOSE, CAPILLARY - Abnormal; Notable for the following:    Glucose-Capillary 113 (*)    All other components within normal limits  GLUCOSE, CAPILLARY - Abnormal; Notable for the following:    Glucose-Capillary 134 (*)    All other components within normal limits  RENAL FUNCTION PANEL - Abnormal; Notable for the following:    Potassium 3.2 (*)    Chloride 88 (*)    Glucose, Bld 113 (*)    BUN 129 (*)    Creatinine, Ser 11.14 (*)    Phosphorus 7.6 (*)    GFR calc non Af Amer 4 (*)    GFR calc Af Amer 5 (*)    Anion gap 24 (*)    All other components within normal limits  FERRITIN - Abnormal; Notable for the following:    Ferritin 483 (*)    All other components within normal limits  FOLATE RBC - Abnormal; Notable for the following:    RBC Folate 691 (*)    All other components within normal limits  GLUCOSE, CAPILLARY - Abnormal; Notable for the following:    Glucose-Capillary 156 (*)    All other components within normal limits  GLUCOSE, CAPILLARY - Abnormal; Notable for the following:    Glucose-Capillary 130 (*)    All other components within normal limits  GLUCOSE, CAPILLARY - Abnormal; Notable for the following:    Glucose-Capillary 107 (*)    All other components within normal limits  GLUCOSE, CAPILLARY - Abnormal; Notable for the following:    Glucose-Capillary 150 (*)    All other components within normal limits  RENAL FUNCTION PANEL - Abnormal; Notable for the following:    Chloride 88 (*)    CO2 35 (*)    BUN 117 (*)    Creatinine, Ser 10.55 (*)    Calcium 8.2 (*)    Phosphorus 6.8 (*)    GFR calc non Af Amer 5 (*)    GFR calc Af Amer 5 (*)    Anion gap 20 (*)    All other components within normal limits  GLUCOSE, CAPILLARY - Abnormal; Notable for the following:    Glucose-Capillary 158 (*)    All other  components within normal limits  GLUCOSE, CAPILLARY - Abnormal; Notable for the following:    Glucose-Capillary 134 (*)    All other components within normal limits  RENAL FUNCTION PANEL - Abnormal; Notable for the following:    Chloride 92 (*)    CO2 35 (*)    BUN 110 (*)    Creatinine, Ser 10.95 (*)    Calcium 8.2 (*)    Phosphorus 7.9 (*)    GFR calc non Af Amer 4 (*)    GFR calc Af Amer 5 (*)    Anion gap 18 (*)    All other components within normal limits  GLUCOSE, CAPILLARY - Abnormal; Notable for the following:    Glucose-Capillary 132 (*)    All other components within normal limits  GLUCOSE, CAPILLARY - Abnormal; Notable for the following:    Glucose-Capillary 163 (*)    All other components within normal limits  GLUCOSE, CAPILLARY - Abnormal; Notable for the following:    Glucose-Capillary 149 (*)    All other components within normal limits  RENAL FUNCTION PANEL - Abnormal; Notable for the following:    Chloride 93 (*)    BUN 104 (*)    Creatinine, Ser 10.86 (*)    Calcium 8.1 (*)    Phosphorus 7.8 (*)    GFR calc non Af Amer 4 (*)    GFR calc Af Amer 5 (*)    Anion gap  18 (*)    All other components within normal limits  GLUCOSE, CAPILLARY - Abnormal; Notable for the following:    Glucose-Capillary 118 (*)    All other components within normal limits  GLUCOSE, CAPILLARY - Abnormal; Notable for the following:    Glucose-Capillary 130 (*)    All other components within normal limits  CBC - Abnormal; Notable for the following:    RBC 3.66 (*)    Hemoglobin 10.0 (*)    HCT 31.1 (*)    All other components within normal limits  GLUCOSE, CAPILLARY - Abnormal; Notable for the following:    Glucose-Capillary 124 (*)    All other components within normal limits  RENAL FUNCTION PANEL - Abnormal; Notable for the following:    Chloride 94 (*)    BUN 103 (*)    Creatinine, Ser 10.95 (*)    Calcium 8.1 (*)    Phosphorus 7.6 (*)    GFR calc non Af Amer 4 (*)    GFR  calc Af Amer 5 (*)    Anion gap 19 (*)    All other components within normal limits  RENAL FUNCTION PANEL - Abnormal; Notable for the following:    Chloride 95 (*)    Glucose, Bld 118 (*)    BUN 98 (*)    Creatinine, Ser 10.25 (*)    Phosphorus 6.5 (*)    GFR calc non Af Amer 5 (*)    GFR calc Af Amer 5 (*)    Anion gap 19 (*)    All other components within normal limits  GLUCOSE, CAPILLARY - Abnormal; Notable for the following:    Glucose-Capillary 109 (*)    All other components within normal limits  GLUCOSE, CAPILLARY - Abnormal; Notable for the following:    Glucose-Capillary 171 (*)    All other components within normal limits  CBC - Abnormal; Notable for the following:    RBC 3.69 (*)    Hemoglobin 10.2 (*)    HCT 31.1 (*)    All other components within normal limits  TSH  SODIUM, URINE, RANDOM  CREATININE, URINE, RANDOM  CK  ANA  MPO/PR-3 (ANCA) ANTIBODIES  C3 COMPLEMENT  ANTI-DNA ANTIBODY, DOUBLE-STRANDED  ANTISTREPTOLYSIN O TITER  GLOMERULAR BASEMENT MEMBRANE ANTIBODIES  IRON AND TIBC  TRANSFERRIN  VITAMIN B12  PROTIME-INR  APTT  GLUCOSE, CAPILLARY  GLUCOSE, CAPILLARY  GLUCOSE, CAPILLARY  GLUCOSE, CAPILLARY  GLUCOSE, CAPILLARY  PROTIME-INR  GLUCOSE, CAPILLARY  GLUCOSE, CAPILLARY  GLUCOSE, CAPILLARY  GLUCOSE, CAPILLARY  GLUCOSE, CAPILLARY  BETA 2 MICROGLOBULIN, SERUM  CYTOLOGY - NON PAP  SURGICAL PATHOLOGY    Imaging Review US Biopsy  12/03/2013   CLINICAL DATA:  63 year old male with progressive chronic kidney disease. Random renal biopsy is requested for further evaluation.  EXAM: ULTRASOUND BIOPSY CORE RENAL  Date: 12/03/2013  PROCEDURE: 1. Ultrasound-guided biopsy of the left kidney Interventional Radiologist:  Criselda Peaches, MD  ANESTHESIA/SEDATION: Moderate (conscious) sedation was used. Four mg Versed, 100 mcg Fentanyl were administered intravenously. The patient's vital signs were monitored continuously by radiology nursing throughout the  procedure.  Sedation Time: 13 minutes  TECHNIQUE: Informed consent was obtained from the patient following explanation of the procedure, risks, benefits and alternatives. The patient understands, agrees and consents for the procedure. All questions were addressed. A time out was performed.  The left and right flanks were interrogated with ultrasound. The left kidney is lower lying and more accessible for random renal biopsy. An appropriate skin entry site  was selected and marked. The region was then sterilely prepped and draped in the usual fashion using Betadine skin prep.  Local anesthesia was attained by infiltration with 1% lidocaine. A small dermatotomy was made. Under real-time sonographic guidance, a 15 gauge introducer needle was advanced through the left retroperitoneum and positioned just outside the lower pole cortex of the left kidney. Two 16 gauge core biopsies were then coaxially obtained using the Bard automated biopsy device. Real-time sonographic imaging was used for all biopsies and placement of the needle within the lower pole cortex was confirmed.  The coaxial needle and biopsy device were removed. Post biopsy ultrasound imaging demonstrates trace perinephric hematoma. No expanding fluid or evidence of active hemorrhage on color Doppler imaging. The patient tolerated the procedure very well.  COMPLICATIONS: None immediate  IMPRESSION: 1. Technically successful ultrasound-guided random biopsy of the lower pole cortex of the left kidney. Regarding the 1.7 cm complex cystic lesion in the medial aspect of the upper pole of the left kidney: Urological consultation is recommended. If the patient is not a candidate for laparoscopic or robotic assisted partial nephrectomy due to worsening renal function, percutaneous thermal ablation may be a good option.  Signed,  Criselda Peaches, MD  [facility]  [facility]   Electronically Signed   By: Jacqulynn Cadet M.D.   On: 12/03/2013 11:35     EKG Interpretation None      MDM   Final diagnoses:  Uremia  Renal failure  Acute on Chronic kidney disease.  Pt with weakness. Has Acute on chronic renal failure with uremia. Will be admitted. Nephrology informed.  Varney Biles, MD 12/05/13 0430

## 2013-12-07 LAB — BETA 2 MICROGLOBULIN, SERUM: BETA 2 MICROGLOBULIN: 7.08 mg/L — AB (ref <=2.51)

## 2013-12-14 ENCOUNTER — Encounter (HOSPITAL_COMMUNITY): Payer: Self-pay

## 2013-12-18 ENCOUNTER — Ambulatory Visit (INDEPENDENT_AMBULATORY_CARE_PROVIDER_SITE_OTHER): Payer: 59 | Admitting: Urology

## 2013-12-18 DIAGNOSIS — N289 Disorder of kidney and ureter, unspecified: Secondary | ICD-10-CM

## 2014-01-31 ENCOUNTER — Other Ambulatory Visit (HOSPITAL_COMMUNITY): Payer: Self-pay | Admitting: *Deleted

## 2014-02-01 ENCOUNTER — Encounter (HOSPITAL_COMMUNITY)
Admission: RE | Admit: 2014-02-01 | Discharge: 2014-02-01 | Disposition: A | Discharge status: Home / Self Care | Payer: 59 | Source: Ambulatory Visit | Attending: Nephrology | Admitting: Nephrology

## 2014-02-01 DIAGNOSIS — N039 Chronic nephritic syndrome with unspecified morphologic changes: Secondary | ICD-10-CM | POA: Diagnosis present

## 2014-02-01 DIAGNOSIS — D631 Anemia in chronic kidney disease: Secondary | ICD-10-CM | POA: Diagnosis not present

## 2014-02-01 DIAGNOSIS — N183 Chronic kidney disease, stage 3 unspecified: Secondary | ICD-10-CM | POA: Diagnosis not present

## 2014-02-01 LAB — HEMOGLOBIN AND HEMATOCRIT, BLOOD
HCT: 25.3 % — ABNORMAL LOW (ref 39.0–52.0)
Hemoglobin: 8.7 g/dL — ABNORMAL LOW (ref 13.0–17.0)

## 2014-02-01 MED ORDER — SODIUM CHLORIDE 0.9 % IV SOLN
1020.0000 mg | Freq: Once | INTRAVENOUS | Status: AC
  Administered 2014-02-01: 1020 mg via INTRAVENOUS
  Filled 2014-02-01: qty 34

## 2014-02-01 MED ORDER — EPOETIN ALFA 10000 UNIT/ML IJ SOLN
INTRAMUSCULAR | Status: AC
  Filled 2014-02-01: qty 1

## 2014-02-01 MED ORDER — EPOETIN ALFA 10000 UNIT/ML IJ SOLN
10000.0000 [IU] | INTRAMUSCULAR | Status: DC
  Administered 2014-02-01: 10000 [IU] via SUBCUTANEOUS

## 2014-02-01 MED ORDER — SODIUM CHLORIDE 0.9 % IV SOLN
INTRAVENOUS | Status: DC
  Administered 2014-02-01: 200 mL via INTRAVENOUS

## 2014-02-01 NOTE — Progress Notes (Signed)
hh faxed to Dr Posey Pronto. Tolerated both meds well.

## 2014-02-02 LAB — IRON AND TIBC
Iron: 53 ug/dL (ref 42–135)
Saturation Ratios: 17 % — ABNORMAL LOW (ref 20–55)
TIBC: 311 ug/dL (ref 215–435)
UIBC: 258 ug/dL (ref 125–400)

## 2014-02-02 LAB — FERRITIN: Ferritin: 531 ng/mL — ABNORMAL HIGH (ref 22–322)

## 2014-02-05 NOTE — Progress Notes (Signed)
Results for WENCESLAUS, GRANDBERRY (MRN [id number]) as of 02/05/2014 08:49  Ref. Range 02/01/2014 13:10  Iron Latest Range: 42-135 ug/dL 53  UIBC Latest Range: 125-400 ug/dL 258  TIBC Latest Range: 215-435 ug/dL 311  Saturation Ratios Latest Range: 20-55 % 17 (L)  Ferritin Latest Range: 22-322 ng/mL 531 (H)  For clinic visit 02/01/2014 @ [facility]

## 2014-02-08 ENCOUNTER — Encounter (HOSPITAL_COMMUNITY)
Admission: RE | Admit: 2014-02-08 | Discharge: 2014-02-08 | Disposition: A | Discharge status: Home / Self Care | Payer: 59 | Source: Ambulatory Visit | Attending: Nephrology | Admitting: Nephrology

## 2014-02-08 ENCOUNTER — Ambulatory Visit (HOSPITAL_COMMUNITY): Payer: 59

## 2014-02-08 ENCOUNTER — Encounter (HOSPITAL_COMMUNITY): Payer: 59

## 2014-02-08 DIAGNOSIS — D631 Anemia in chronic kidney disease: Secondary | ICD-10-CM | POA: Diagnosis not present

## 2014-02-08 LAB — HEMOGLOBIN AND HEMATOCRIT, BLOOD
HEMATOCRIT: 28.8 % — AB (ref 39.0–52.0)
Hemoglobin: 9.6 g/dL — ABNORMAL LOW (ref 13.0–17.0)

## 2014-02-08 MED ORDER — EPOETIN ALFA 10000 UNIT/ML IJ SOLN
10000.0000 [IU] | INTRAMUSCULAR | Status: DC
  Administered 2014-02-08: 10000 [IU] via SUBCUTANEOUS

## 2014-02-08 MED ORDER — EPOETIN ALFA 10000 UNIT/ML IJ SOLN
INTRAMUSCULAR | Status: AC
  Filled 2014-02-08: qty 1

## 2014-02-08 NOTE — Progress Notes (Signed)
Results for DELSHON, KANHAI (MRN [id number]) as of 02/08/2014 13:08  Ref. Range 02/08/2014 13:10  Hemoglobin Latest Range: 13.0-17.0 g/dL 9.6 (L)  HCT Latest Range: 39.0-52.0 % 28.8 (L)  Procrit 10,000 units given this visit

## 2014-02-15 ENCOUNTER — Encounter (HOSPITAL_COMMUNITY)
Admission: RE | Admit: 2014-02-15 | Discharge: 2014-02-15 | Disposition: A | Discharge status: Home / Self Care | Payer: 59 | Source: Ambulatory Visit | Attending: Nephrology | Admitting: Nephrology

## 2014-02-15 DIAGNOSIS — N039 Chronic nephritic syndrome with unspecified morphologic changes: Secondary | ICD-10-CM | POA: Diagnosis not present

## 2014-02-15 DIAGNOSIS — D631 Anemia in chronic kidney disease: Secondary | ICD-10-CM | POA: Diagnosis not present

## 2014-02-15 LAB — HEMOGLOBIN AND HEMATOCRIT, BLOOD
HCT: 29.6 % — ABNORMAL LOW (ref 39.0–52.0)
Hemoglobin: 9.9 g/dL — ABNORMAL LOW (ref 13.0–17.0)

## 2014-02-15 MED ORDER — EPOETIN ALFA 10000 UNIT/ML IJ SOLN
10000.0000 [IU] | Freq: Once | INTRAMUSCULAR | Status: AC
  Administered 2014-02-15: 10000 [IU] via SUBCUTANEOUS

## 2014-02-15 MED ORDER — EPOETIN ALFA 10000 UNIT/ML IJ SOLN
INTRAMUSCULAR | Status: AC
  Filled 2014-02-15: qty 1

## 2014-02-15 NOTE — Progress Notes (Signed)
Results for BAMIDELE, VILLASENOR (MRN [id number]) as of 02/15/2014 07:32  Ref. Range 02/15/2014 07:15  Hemoglobin Latest Range: 13.0-17.0 g/dL 9.9 (L)  HCT Latest Range: 39.0-52.0 % 29.6 (L)  Procrit 10000 units given subq per MD order.

## 2014-02-22 ENCOUNTER — Encounter (HOSPITAL_COMMUNITY)
Admission: RE | Admit: 2014-02-22 | Discharge: 2014-02-22 | Disposition: A | Discharge status: Home / Self Care | Payer: 59 | Source: Ambulatory Visit | Attending: Nephrology | Admitting: Nephrology

## 2014-02-22 ENCOUNTER — Encounter (HOSPITAL_COMMUNITY): Payer: Self-pay

## 2014-02-22 DIAGNOSIS — D631 Anemia in chronic kidney disease: Secondary | ICD-10-CM | POA: Diagnosis not present

## 2014-02-22 DIAGNOSIS — N039 Chronic nephritic syndrome with unspecified morphologic changes: Secondary | ICD-10-CM | POA: Diagnosis not present

## 2014-02-22 LAB — HEMOGLOBIN AND HEMATOCRIT, BLOOD
HCT: 31.3 % — ABNORMAL LOW (ref 39.0–52.0)
Hemoglobin: 10.4 g/dL — ABNORMAL LOW (ref 13.0–17.0)

## 2014-02-22 MED ORDER — EPOETIN ALFA 10000 UNIT/ML IJ SOLN
INTRAMUSCULAR | Status: AC
  Filled 2014-02-22: qty 1

## 2014-02-22 MED ORDER — EPOETIN ALFA 10000 UNIT/ML IJ SOLN
10000.0000 [IU] | INTRAMUSCULAR | Status: DC
  Administered 2014-02-22: 10000 [IU] via SUBCUTANEOUS

## 2014-02-22 NOTE — Progress Notes (Signed)
Results for Steven Perez, Steven Perez (MRN [id number]) as of 02/22/2014 07:34  Ref. Range 02/22/2014 07:06  Hemoglobin Latest Range: 13.0-17.0 g/dL 10.4 (L)  HCT Latest Range: 39.0-52.0 % 31.3 (L)    Procrit 10,000 units given per MD order. Pt tolerated well.

## 2014-03-01 ENCOUNTER — Encounter (HOSPITAL_COMMUNITY)
Admission: RE | Admit: 2014-03-01 | Discharge: 2014-03-01 | Disposition: A | Discharge status: Home / Self Care | Payer: 59 | Source: Ambulatory Visit | Attending: Nephrology | Admitting: Nephrology

## 2014-03-01 DIAGNOSIS — N183 Chronic kidney disease, stage 3 (moderate): Secondary | ICD-10-CM | POA: Insufficient documentation

## 2014-03-01 DIAGNOSIS — D631 Anemia in chronic kidney disease: Secondary | ICD-10-CM | POA: Diagnosis not present

## 2014-03-01 LAB — IRON AND TIBC
Iron: 50 ug/dL (ref 42–135)
SATURATION RATIOS: 18 % — AB (ref 20–55)
TIBC: 272 ug/dL (ref 215–435)
UIBC: 222 ug/dL (ref 125–400)

## 2014-03-01 LAB — FERRITIN: FERRITIN: 490 ng/mL — AB (ref 22–322)

## 2014-03-01 LAB — HEMOGLOBIN AND HEMATOCRIT, BLOOD
HCT: 32.3 % — ABNORMAL LOW (ref 39.0–52.0)
Hemoglobin: 10.9 g/dL — ABNORMAL LOW (ref 13.0–17.0)

## 2014-03-01 MED ORDER — EPOETIN ALFA 10000 UNIT/ML IJ SOLN
10000.0000 [IU] | INTRAMUSCULAR | Status: DC
  Administered 2014-03-01: 10000 [IU] via SUBCUTANEOUS
  Filled 2014-03-01: qty 1

## 2014-03-08 ENCOUNTER — Encounter (HOSPITAL_COMMUNITY)
Admission: RE | Admit: 2014-03-08 | Discharge: 2014-03-08 | Disposition: A | Discharge status: Home / Self Care | Payer: 59 | Source: Ambulatory Visit | Attending: Nephrology | Admitting: Nephrology

## 2014-03-08 DIAGNOSIS — N183 Chronic kidney disease, stage 3 (moderate): Secondary | ICD-10-CM | POA: Diagnosis not present

## 2014-03-08 LAB — HEMOGLOBIN AND HEMATOCRIT, BLOOD
HEMATOCRIT: 36.3 % — AB (ref 39.0–52.0)
HEMOGLOBIN: 12.3 g/dL — AB (ref 13.0–17.0)

## 2014-03-08 NOTE — Progress Notes (Signed)
Results for MERGIM, ROTHWELL (MRN [id number]) as of 03/08/2014 07:39  Ref. Range 03/08/2014 07:20  Hemoglobin Latest Range: 13.0-17.0 g/dL 12.3 (L)  HCT Latest Range: 39.0-52.0 % 36.3 (L)

## 2014-03-15 ENCOUNTER — Encounter (HOSPITAL_COMMUNITY)
Admission: RE | Admit: 2014-03-15 | Discharge: 2014-03-15 | Disposition: A | Discharge status: Home / Self Care | Payer: 59 | Source: Ambulatory Visit | Attending: Nephrology | Admitting: Nephrology

## 2014-03-15 DIAGNOSIS — N183 Chronic kidney disease, stage 3 (moderate): Secondary | ICD-10-CM | POA: Diagnosis not present

## 2014-03-15 LAB — HEMOGLOBIN AND HEMATOCRIT, BLOOD
HCT: 35.2 % — ABNORMAL LOW (ref 39.0–52.0)
Hemoglobin: 12 g/dL — ABNORMAL LOW (ref 13.0–17.0)

## 2014-03-15 NOTE — Progress Notes (Signed)
Results for ARTIST, ZLOTNICK (MRN [id number]) as of 03/15/2014 07:16  Ref. Range 03/15/2014 06:58  Hemoglobin Latest Range: 13.0-17.0 g/dL 12.0 (L)  HCT Latest Range: 39.0-52.0 % 35.2 (L)    Procrit injection not needed per MD parameter.

## 2014-03-19 ENCOUNTER — Other Ambulatory Visit: Payer: Self-pay | Admitting: Urology

## 2014-03-19 ENCOUNTER — Ambulatory Visit (INDEPENDENT_AMBULATORY_CARE_PROVIDER_SITE_OTHER): Payer: 59 | Admitting: Urology

## 2014-03-19 DIAGNOSIS — N2889 Other specified disorders of kidney and ureter: Secondary | ICD-10-CM

## 2014-03-22 ENCOUNTER — Ambulatory Visit (HOSPITAL_COMMUNITY): Payer: 59

## 2014-03-22 ENCOUNTER — Other Ambulatory Visit (HOSPITAL_COMMUNITY): Payer: 59

## 2014-03-29 ENCOUNTER — Encounter (HOSPITAL_COMMUNITY): Payer: Self-pay

## 2014-03-29 ENCOUNTER — Encounter (HOSPITAL_COMMUNITY)
Admission: RE | Admit: 2014-03-29 | Discharge: 2014-03-29 | Disposition: A | Discharge status: Home / Self Care | Payer: 59 | Source: Ambulatory Visit | Attending: Nephrology | Admitting: Nephrology

## 2014-03-29 DIAGNOSIS — N183 Chronic kidney disease, stage 3 (moderate): Secondary | ICD-10-CM | POA: Diagnosis not present

## 2014-03-29 LAB — HEMOGLOBIN AND HEMATOCRIT, BLOOD
HEMATOCRIT: 34 % — AB (ref 39.0–52.0)
Hemoglobin: 11.6 g/dL — ABNORMAL LOW (ref 13.0–17.0)

## 2014-03-29 NOTE — Progress Notes (Signed)
Results for ORIEN, KURTYKA (MRN [id number]) as of 03/29/2014 07:19 H&H done this am. No procrit required due to order set. Repeat labs in 2 weeks.   Ref. Range 03/29/2014 07:04  Hemoglobin Latest Range: 13.0-17.0 g/dL 11.6 (L)  HCT Latest Range: 39.0-52.0 % 34.0 (L)

## 2014-04-10 ENCOUNTER — Ambulatory Visit
Admission: RE | Admit: 2014-04-10 | Discharge: 2014-04-10 | Disposition: A | Discharge status: Home / Self Care | Payer: 59 | Source: Ambulatory Visit | Attending: Urology | Admitting: Urology

## 2014-04-10 DIAGNOSIS — N2889 Other specified disorders of kidney and ureter: Secondary | ICD-10-CM

## 2014-04-10 HISTORY — DX: Insomnia, unspecified: G47.00

## 2014-04-10 HISTORY — DX: Disorder of kidney and ureter, unspecified: N28.9

## 2014-04-10 HISTORY — DX: Pain in unspecified joint: M25.50

## 2014-04-10 HISTORY — DX: Anemia, unspecified: D64.9

## 2014-04-10 HISTORY — DX: Obesity, unspecified: E66.9

## 2014-04-10 HISTORY — DX: Secondary hyperparathyroidism of renal origin: N25.81

## 2014-04-10 HISTORY — DX: Chronic kidney disease, unspecified: N18.9

## 2014-04-10 HISTORY — DX: Erectile dysfunction due to arterial insufficiency: N52.01

## 2014-04-10 HISTORY — DX: Hyperlipidemia, unspecified: E78.5

## 2014-04-10 HISTORY — DX: Other specified disorders of kidney and ureter: N28.89

## 2014-04-11 ENCOUNTER — Other Ambulatory Visit (HOSPITAL_COMMUNITY): Payer: Self-pay | Admitting: Interventional Radiology

## 2014-04-11 DIAGNOSIS — N2889 Other specified disorders of kidney and ureter: Secondary | ICD-10-CM

## 2014-04-12 ENCOUNTER — Encounter (HOSPITAL_COMMUNITY): Payer: 59

## 2014-04-12 ENCOUNTER — Encounter (HOSPITAL_COMMUNITY)
Admission: RE | Admit: 2014-04-12 | Discharge: 2014-04-12 | Disposition: A | Discharge status: Home / Self Care | Payer: 59 | Source: Ambulatory Visit | Attending: Nephrology | Admitting: Nephrology

## 2014-04-12 DIAGNOSIS — N183 Chronic kidney disease, stage 3 (moderate): Secondary | ICD-10-CM | POA: Insufficient documentation

## 2014-04-12 DIAGNOSIS — D631 Anemia in chronic kidney disease: Secondary | ICD-10-CM | POA: Diagnosis not present

## 2014-04-12 LAB — HEMOGLOBIN AND HEMATOCRIT, BLOOD
HEMATOCRIT: 34.4 % — AB (ref 39.0–52.0)
Hemoglobin: 11.8 g/dL — ABNORMAL LOW (ref 13.0–17.0)

## 2014-04-12 LAB — IRON AND TIBC
Iron: 70 ug/dL (ref 42–135)
Saturation Ratios: 27 % (ref 20–55)
TIBC: 259 ug/dL (ref 215–435)
UIBC: 189 ug/dL (ref 125–400)

## 2014-04-12 LAB — FERRITIN: FERRITIN: 424 ng/mL — AB (ref 22–322)

## 2014-04-12 NOTE — Progress Notes (Signed)
hgb 11.8 so med held. Next 2 week visit on holiday so pt requests to wait until following Friday (3 weeks).

## 2014-04-12 NOTE — Progress Notes (Signed)
hh faxed to Dr Posey Pronto.

## 2014-04-19 ENCOUNTER — Ambulatory Visit (HOSPITAL_COMMUNITY): Payer: 59

## 2014-04-19 ENCOUNTER — Inpatient Hospital Stay (HOSPITAL_COMMUNITY): Admission: RE | Admit: 2014-04-19 | Payer: 59 | Source: Ambulatory Visit

## 2014-04-24 NOTE — Consult Note (Signed)
Chief Complaint: Chief Complaint  Patient presents with  . Advice Only    Consult for Left Renal Mass    Referring Physician(s): Dwight  History of Present Illness: Steven Perez is a 63 y.o. male with detection of a 1.6x1.8 cm cystic lesion by CT on 10/31/13 performed for hematuria.  He did sustain significant acute kidney injury after contrast administration for that CT resulting in hospitalization and renal biopsy in July showing acute tubular injury.  He is followed by Dr. Elmarie Shiley who is scheduled to follow up with the patient soon.  Renal function has shown gradual improvement.  I am not sure what his latest Cr is.  It was as high as 12.  MRI without contrast demonstrated a septated cystic lesion of the left posterior mid kidney measuring 1.8 cm.  The patient is currently asymptomatic.  Hematuria has resolved.  Past Medical History  Diagnosis Date  . Diabetes mellitus   . Hypertension   . HLD (hyperlipidemia)   . Left renal mass   . Erectile dysfunction due to arterial insufficiency   . Chronic kidney disease     Stage 4  . Shortness of breath dyspnea   . Anemia     assoc w/ chronic reanl failure  . Secondary hyperparathyroidism of renal origin   . Acute on chronic renal insufficiency   . Obesity   . Arthralgia   . Insomnia   . Dyslipidemia     Past Surgical History  Procedure Laterality Date  . Tonsillectomy    . Renal biopsy, percutaneous    . Renal biopsy      Allergies: Tetanus toxoids and Contrast media  Medications: Prior to Admission medications   Medication Sig Start Date End Date Taking? Authorizing Provider  acetaminophen (TYLENOL) 500 MG tablet Take 500 mg by mouth at bedtime as needed. Pain   Yes Historical Provider, MD  carvedilol (COREG) 6.25 MG tablet Take 6.25 mg by mouth 2 (two) times daily with a meal.   Yes Historical Provider, MD  furosemide (LASIX) 40 MG tablet Take 40 mg by mouth.   Yes Historical Provider, MD  insulin  glargine (LANTUS) 100 UNIT/ML injection Inject 20 Units into the skin at bedtime.   Yes Historical Provider, MD  simvastatin (ZOCOR) 80 MG tablet Take 40 mg by mouth at bedtime.   Yes Historical Provider, MD  tadalafil (CIALIS) 10 MG tablet Take 10 mg by mouth daily as needed. Sexual Arousal   Yes Historical Provider, MD  traZODone (DESYREL) 50 MG tablet Take 100 mg by mouth at bedtime.   Yes Historical Provider, MD  verapamil (CALAN-SR) 180 MG CR tablet Take 2 tablets (360 mg total) by mouth at bedtime. 12/04/13  Yes Janece Canterbury, MD    Family History  Problem Relation Age of Onset  . Diabetes Mellitus II Mother   . CAD Neg Hx   . Stroke Neg Hx     History   Social History  . Marital Status: Married    Spouse Name: N/A    Number of Children: N/A  . Years of Education: N/A   Social History Main Topics  . Smoking status: Former Research scientist (life sciences)  . Smokeless tobacco: None  . Alcohol Use: Yes     Comment: occasionally  . Drug Use: No  . Sexual Activity: None   Other Topics Concern  . None   Social History Narrative      Review of Systems: A 12 point ROS discussed and pertinent  positives are indicated in the HPI above.  All other systems are negative.  Review of Systems  Constitutional: Negative.   HENT: Negative.   Respiratory: Negative.   Cardiovascular: Negative.   Gastrointestinal: Negative.   Genitourinary: Negative.   Musculoskeletal: Negative.   Neurological: Negative.   Hematological: Negative.     Vital Signs: BP 164/91 mmHg  Pulse 60  Temp(Src) 98.5 F (36.9 C) (Oral)  Resp 14  Ht '5\' 8"'  (1.727 m)  Wt 230 lb (104.327 kg)  BMI 34.98 kg/m2  SpO2 99%  Physical Exam  Constitutional: He is oriented to person, place, and time. He appears well-developed and well-nourished.  Cardiovascular: Normal rate, regular rhythm, normal heart sounds and intact distal pulses.  Exam reveals no gallop and no friction rub.   No murmur heard. Pulmonary/Chest: Breath sounds  normal. No respiratory distress. He has no wheezes. He has no rales. He exhibits no tenderness.  Abdominal: Soft. Bowel sounds are normal. He exhibits no distension and no mass. There is no tenderness. There is no rebound and no guarding.  Neurological: He is alert and oriented to person, place, and time.  Skin: Skin is warm and dry. No rash noted. He is not diaphoretic. No erythema.    Imaging: No results found.  Labs:  CBC:  Recent Labs  11/26/13 1840 11/27/13 0603 12/03/13 0405 12/04/13 1045  03/08/14 0720 03/15/14 0658 03/29/14 0704 04/12/14 0730  WBC 5.4 4.8 6.6 5.8  --   --   --   --   --   HGB 10.8* 11.0* 10.0* 10.2*  < > 12.3* 12.0* 11.6* 11.8*  HCT 30.8* 31.6* 31.1* 31.1*  < > 36.3* 35.2* 34.0* 34.4*  PLT 253 252 226 235  --   --   --   --   --   < > = values in this interval not displayed.  COAGS:  Recent Labs  11/29/13 1220 12/03/13 0405  INR 1.06 1.06  APTT 30  --     BMP:  Recent Labs  12/01/13 0440 12/02/13 0421 12/03/13 0405 12/04/13 1045  NA 145 143 143 143  K 3.7 4.0 3.8 4.6  CL 92* 93* 94* 95*  CO2 35* 32 30 29  GLUCOSE 86 75 87 118*  BUN 110* 104* 103* 98*  CALCIUM 8.2* 8.1* 8.1* 8.6  CREATININE 10.95* 10.86* 10.95* 10.25*  GFRNONAA 4* 4* 4* 5*  GFRAA 5* 5* 5* 5*    LIVER FUNCTION TESTS:  Recent Labs  11/27/13 0603  12/01/13 0440 12/02/13 0421 12/03/13 0405 12/04/13 1045  BILITOT 0.7  --   --   --   --   --   AST 12  --   --   --   --   --   ALT 11  --   --   --   --   --   ALKPHOS 32*  --   --   --   --   --   PROT 8.1  --   --   --   --   --   ALBUMIN 4.5  < > 4.2 3.8 4.1 4.2  < > = values in this interval not displayed.  TUMOR MARKERS: No results for input(s): AFPTM, CEA, CA199, CHROMGRNA in the last 8760 hours.  Assessment and Plan:  I met with Mr. Bagot and reviewed imaging performed in June.  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  This  could be benign.   Given his significant chronic kidney disease and recovery from acute renal injury, it would be most prudent to follow the lesion for growth before making any treatment decision.  I recommended follow up MRI without contrast towards the end of December, which would represent a 6 month follow up since the prior MRI in June.  We did briefly discuss the possibility of performing percutaneous cryoablation in the future should the lesion grow or exhibit more suspicious characteristics for a cystic malignancy.  Biopsy could also be contemplated prior to ablation, given higher risks of worsening of chronic renal insufficiency with any intervention.  Percutaneous ablation would be likely the most nephron sparing procedure should he need treatment of the lesion in the future.  The patient is agreeable to proceed with a follow up MRI in late December.  I will meet with him to review findings at that time.  Thank you for this interesting consult.  I greatly enjoyed meeting Bernabe Dorce and look forward to participating in their care.    I spent a total of 30 minutes face to face in clinical consultation, greater than 50% of which was counseling/coordinating care for management of the left renal lesion.  SignedAletta Edouard T 04/24/2014, 4:35 PM

## 2014-05-03 ENCOUNTER — Encounter (HOSPITAL_COMMUNITY)
Admission: RE | Admit: 2014-05-03 | Discharge: 2014-05-03 | Disposition: A | Discharge status: Home / Self Care | Payer: 59 | Source: Ambulatory Visit | Attending: Nephrology | Admitting: Nephrology

## 2014-05-03 ENCOUNTER — Encounter (HOSPITAL_COMMUNITY): Payer: Self-pay

## 2014-05-03 DIAGNOSIS — D638 Anemia in other chronic diseases classified elsewhere: Secondary | ICD-10-CM | POA: Diagnosis not present

## 2014-05-03 DIAGNOSIS — N183 Chronic kidney disease, stage 3 (moderate): Secondary | ICD-10-CM | POA: Diagnosis not present

## 2014-05-03 LAB — HEMOGLOBIN AND HEMATOCRIT, BLOOD
HCT: 38 % — ABNORMAL LOW (ref 39.0–52.0)
Hemoglobin: 12.7 g/dL — ABNORMAL LOW (ref 13.0–17.0)

## 2014-05-03 NOTE — Progress Notes (Signed)
Results for Steven Perez, Steven Perez (MRN [id number]) as of 05/03/2014 16:13  Ref. Range 05/03/2014 09:00  Hemoglobin Latest Range: 13.0-17.0 g/dL 12.7 (L)  HCT Latest Range: 39.0-52.0 % 38.0 (L)    No injection given per MD order parameters.

## 2014-05-17 ENCOUNTER — Encounter (HOSPITAL_COMMUNITY)
Admission: RE | Admit: 2014-05-17 | Discharge: 2014-05-17 | Disposition: A | Discharge status: Home / Self Care | Payer: 59 | Source: Ambulatory Visit | Attending: Nephrology | Admitting: Nephrology

## 2014-05-17 DIAGNOSIS — D638 Anemia in other chronic diseases classified elsewhere: Secondary | ICD-10-CM | POA: Diagnosis not present

## 2014-05-17 LAB — IRON AND TIBC
Iron: 104 ug/dL (ref 42–135)
Saturation Ratios: 35 % (ref 20–55)
TIBC: 296 ug/dL (ref 215–435)
UIBC: 192 ug/dL (ref 125–400)

## 2014-05-17 LAB — HEMOGLOBIN AND HEMATOCRIT, BLOOD
HCT: 37.3 % — ABNORMAL LOW (ref 39.0–52.0)
Hemoglobin: 12.6 g/dL — ABNORMAL LOW (ref 13.0–17.0)

## 2014-05-17 LAB — FERRITIN: FERRITIN: 421 ng/mL — AB (ref 22–322)

## 2014-05-17 NOTE — Progress Notes (Signed)
Results for THEORY, IMIG (MRN [id number]) as of 05/17/2014 15:48  Ref. Range 05/17/2014 07:07  Iron Latest Range: 42-135 ug/dL 104  UIBC Latest Range: 125-400 ug/dL 192  TIBC Latest Range: 215-435 ug/dL 296  Saturation Ratios Latest Range: 20-55 % 35  Ferritin Latest Range: 22-322 ng/mL 421 (H)  Hemoglobin Latest Range: 13.0-17.0 g/dL 12.6 (L)  HCT Latest Range: 39.0-52.0 % 37.3 (L)

## 2014-05-17 NOTE — Progress Notes (Signed)
Here for labs and procrit if indicated. Dx anemia, CKD stage 3.

## 2014-05-28 ENCOUNTER — Ambulatory Visit (HOSPITAL_COMMUNITY)
Admission: RE | Admit: 2014-05-28 | Discharge: 2014-05-28 | Disposition: A | Discharge status: Home / Self Care | Payer: 59 | Source: Ambulatory Visit | Attending: Interventional Radiology | Admitting: Interventional Radiology

## 2014-05-28 DIAGNOSIS — N2889 Other specified disorders of kidney and ureter: Secondary | ICD-10-CM | POA: Diagnosis present

## 2014-06-04 ENCOUNTER — Ambulatory Visit
Admission: RE | Admit: 2014-06-04 | Discharge: 2014-06-04 | Disposition: A | Discharge status: Home / Self Care | Payer: 59 | Source: Ambulatory Visit | Attending: Interventional Radiology | Admitting: Interventional Radiology

## 2014-06-04 DIAGNOSIS — N2889 Other specified disorders of kidney and ureter: Secondary | ICD-10-CM

## 2014-06-04 HISTORY — PX: IR GENERIC HISTORICAL: IMG1180011

## 2014-06-04 NOTE — Progress Notes (Signed)
Chief Complaint: Chief Complaint  Patient presents with  . Follow-up    Surveillance of Left renal lesion   History of Present Illness: Steven Perez is a 64 y.o. male last seen in November after referral from Dr. Diona Fanti for evaluation of a complex cystic lesion of the left kidney. At that time, it was decided to follow the lesion with interval MRI given its predominately cystic nature as well as underlying renal insufficiency from acute kidney injury. The patient has been followed by Dr. Posey Pronto who has referred him to [facility] for additional nephrology evaluation due to persistent renal insufficiency. The latest creatinine I have is 4.95 on 01/29/2014. Patient is scheduled to be seen at [facility] tomorrow. He remains asymptomatic and has not had any recurrent hematuria. He denies any pain.  Past Medical History  Diagnosis Date  . Diabetes mellitus   . Hypertension   . HLD (hyperlipidemia)   . Left renal mass   . Erectile dysfunction due to arterial insufficiency   . Chronic kidney disease     Stage 4  . Shortness of breath dyspnea   . Anemia     assoc w/ chronic reanl failure  . Secondary hyperparathyroidism of renal origin   . Acute on chronic renal insufficiency   . Obesity   . Arthralgia   . Insomnia   . Dyslipidemia     Past Surgical History  Procedure Laterality Date  . Tonsillectomy    . Renal biopsy, percutaneous    . Renal biopsy      Allergies: Tetanus toxoids and Contrast media  Medications: Prior to Admission medications   Medication Sig Start Date End Date Taking? Authorizing Provider  acetaminophen (TYLENOL) 500 MG tablet Take 500 mg by mouth at bedtime as needed. Pain    Historical Provider, MD  carvedilol (COREG) 6.25 MG tablet Take 6.25 mg by mouth 2 (two) times daily with a meal.    Historical Provider, MD  furosemide (LASIX) 40 MG tablet Take 40 mg by mouth.    Historical Provider, MD  insulin glargine (LANTUS) 100 UNIT/ML injection Inject 20 Units  into the skin at bedtime.    Historical Provider, MD  simvastatin (ZOCOR) 80 MG tablet Take 40 mg by mouth at bedtime.    Historical Provider, MD  tadalafil (CIALIS) 10 MG tablet Take 10 mg by mouth daily as needed. Sexual Arousal    Historical Provider, MD  traZODone (DESYREL) 50 MG tablet Take 100 mg by mouth at bedtime.    Historical Provider, MD  verapamil (CALAN-SR) 180 MG CR tablet Take 2 tablets (360 mg total) by mouth at bedtime. 12/04/13   Janece Canterbury, MD    Family History  Problem Relation Age of Onset  . Diabetes Mellitus II Mother   . CAD Neg Hx   . Stroke Neg Hx     History   Social History  . Marital Status: Married    Spouse Name: N/A    Number of Children: N/A  . Years of Education: N/A   Social History Main Topics  . Smoking status: Former Research scientist (life sciences)  . Smokeless tobacco: Not on file  . Alcohol Use: Yes     Comment: occasionally  . Drug Use: No  . Sexual Activity: Not on file   Other Topics Concern  . Not on file   Social History Narrative     Review of Systems: A 12 point ROS discussed and pertinent positives are indicated in the HPI above.  All other  systems are negative.  Review of Systems  Constitutional: Negative.   Respiratory: Negative.   Cardiovascular: Negative.   Gastrointestinal: Positive for constipation.  Genitourinary: Negative.   Neurological: Negative.     Vital Signs: BP 164/87 mmHg  Pulse 56  Temp(Src) 97.6 F (36.4 C) (Oral)  Resp 14  SpO2 99%  Physical Exam  Constitutional: No distress.  Abdominal: Soft. He exhibits no distension. There is no tenderness.  Skin: He is not diaphoretic.    Imaging: Mr Abdomen Wo Contrast  05/28/2014   CLINICAL DATA:  Follow up complex left renal lesion. History of left renal random biopsy 12/03/2013 revealing acute and subacute tubular interstitial nephritis.  EXAM: MRI ABDOMEN WITHOUT CONTRAST  TECHNIQUE: Multiplanar multisequence MR imaging was performed without the administration of  intravenous contrast.  COMPARISON:  Abdominal CT 10/30/2013.  Abdominal MRI 11/22/2013.  FINDINGS: Lower chest:  Unremarkable.  Hepatobiliary: The liver demonstrates loss of signal on the in phase gradient echo images, most consistent with iron overload. No focal lesions are demonstrated. No evidence of gallstones, gallbladder wall thickening or biliary dilatation.  Pancreas: Unremarkable. No pancreatic ductal dilatation or surrounding inflammatory changes.  Spleen: Normal in size without focal abnormality. The spleen also demonstrates loss of signal on the in phase gradient echo images, consistent with iron deposition.  Adrenals/Urinary Tract: Both adrenal glands appear normal.The complex cystic lesion of concern posteriorly in the interpolar region of the left kidney has mildly enlarged, measuring 1.7 x 2.2 x 2.0 cm. This lesion appears septated, but is not further characterized without contrast. Simple renal cysts are present bilaterally. There is no hydronephrosis.  Stomach/Bowel: No evidence of bowel wall thickening, distention or surrounding inflammatory change.  Vascular/Lymphatic: Mildly prominent lymph nodes within the porta hepatis are stable. There is no retroperitoneal lymphadenopathy. No significant vascular findings are present.  Other: None.  Musculoskeletal: No acute or significant osseous findings. There is a stable hemangioma within the T11 vertebral body.  IMPRESSION: 1. The complex cystic lesion posteriorly in the mid left kidney has mildly enlarged compared with the MRI performed 6 months ago. Based on the enhancement demonstrated on the prior CT, this could reflect a cystic neoplasm, not further characterized by the present noncontrast MRI. Continued follow up recommended. If the patient is able to receive iodinated contrast, CT follow-up without and with contrast in approximately 6 months may be helpful. 2. Additional simple cysts are present within both kidneys. 3. Changes of iron deposition  within the liver and spleen, likely related to transfusions.   Electronically Signed   By: Camie Patience M.D.   On: 05/28/2014 11:22    Labs:  CBC:  Recent Labs  11/26/13 1840 11/27/13 0603 12/03/13 0405 12/04/13 1045  03/29/14 0704 04/12/14 0730 05/03/14 0900 05/17/14 0707  WBC 5.4 4.8 6.6 5.8  --   --   --   --   --   HGB 10.8* 11.0* 10.0* 10.2*  < > 11.6* 11.8* 12.7* 12.6*  HCT 30.8* 31.6* 31.1* 31.1*  < > 34.0* 34.4* 38.0* 37.3*  PLT 253 252 226 235  --   --   --   --   --   < > = values in this interval not displayed.  COAGS:  Recent Labs  11/29/13 1220 12/03/13 0405  INR 1.06 1.06  APTT 30  --     BMP:  Recent Labs  12/01/13 0440 12/02/13 0421 12/03/13 0405 12/04/13 1045  NA 145 143 143 143  K 3.7 4.0 3.8 4.6  CL 92* 93* 94* 95*  CO2 35* 32 30 29  GLUCOSE 86 75 87 118*  BUN 110* 104* 103* 98*  CALCIUM 8.2* 8.1* 8.1* 8.6  CREATININE 10.95* 10.86* 10.95* 10.25*  GFRNONAA 4* 4* 4* 5*  GFRAA 5* 5* 5* 5*    LIVER FUNCTION TESTS:  Recent Labs  11/27/13 0603  12/01/13 0440 12/02/13 0421 12/03/13 0405 12/04/13 1045  BILITOT 0.7  --   --   --   --   --   AST 12  --   --   --   --   --   ALT 11  --   --   --   --   --   ALKPHOS 32*  --   --   --   --   --   PROT 8.1  --   --   --   --   --   ALBUMIN 4.5  < > 4.2 3.8 4.1 4.2  < > = values in this interval not displayed.  TUMOR MARKERS: No results for input(s): AFPTM, CEA, CA199, CHROMGRNA in the last 8760 hours.  Assessment and Plan:  Follow-up MRI was performed without contrast on 05/28/2014 and compared to the prior MRI study on 11/22/2013. This demonstrates mild enlargement of the septated cystic lesion of the left kidney which now measures approximately 2.2 cm in greatest diameter. When re-measuring on the prior scan, I obtained a maximal diameter of approximately 1.9 cm. The lesion continues to show one mildly irregular central internal septation. No obvious mural nodularity is identified.  Enhancement pattern cannot be determined as gadolinium cannot be administered safely in the setting of current renal insufficiency.  Given persistent significant renal insufficiency and pending evaluation at [facility], I did not recommend pursuing biopsy or percutaneous ablation at this time. This may still represent a benign complex cyst that has shown interval enlargement. Ablation could have some detriment on overall renal function and would not be currently indicated.  Currently, I have recommended a follow-up MRI without contrast in 6 months. The patient is agreeable with this plan.  I spent a total of 20 minutes face to face in clinical consultation, greater than 50% of which was counseling/coordinating care for a left renal mass.  SignedAletta Edouard T 06/04/2014, 10:17 AM

## 2014-06-07 ENCOUNTER — Encounter (HOSPITAL_COMMUNITY)
Admission: RE | Admit: 2014-06-07 | Discharge: 2014-06-07 | Disposition: A | Discharge status: Home / Self Care | Payer: BC Managed Care – PPO | Source: Ambulatory Visit | Attending: Nephrology | Admitting: Nephrology

## 2014-06-07 DIAGNOSIS — D638 Anemia in other chronic diseases classified elsewhere: Secondary | ICD-10-CM | POA: Insufficient documentation

## 2014-06-07 DIAGNOSIS — N184 Chronic kidney disease, stage 4 (severe): Secondary | ICD-10-CM | POA: Insufficient documentation

## 2014-06-07 LAB — HEMOGLOBIN AND HEMATOCRIT, BLOOD
HCT: 35.5 % — ABNORMAL LOW (ref 39.0–52.0)
HEMOGLOBIN: 12.1 g/dL — AB (ref 13.0–17.0)

## 2014-06-07 NOTE — Progress Notes (Signed)
Results for BOWAN, FOWLE (MRN [id number]) as of 06/07/2014 07:27  Ref. Range 06/07/2014 07:10  Hemoglobin Latest Range: 13.0-17.0 g/dL 12.1 (L)  HCT Latest Range: 39.0-52.0 % 35.5 (L)

## 2014-06-07 NOTE — Progress Notes (Signed)
Here for labs and procrit if indicated. Dx CKD, anemia.

## 2014-06-19 DIAGNOSIS — E785 Hyperlipidemia, unspecified: Secondary | ICD-10-CM | POA: Insufficient documentation

## 2014-06-19 DIAGNOSIS — N281 Cyst of kidney, acquired: Secondary | ICD-10-CM | POA: Insufficient documentation

## 2014-06-19 DIAGNOSIS — N529 Male erectile dysfunction, unspecified: Secondary | ICD-10-CM | POA: Insufficient documentation

## 2014-06-19 DIAGNOSIS — Z01818 Encounter for other preprocedural examination: Secondary | ICD-10-CM | POA: Insufficient documentation

## 2014-06-19 DIAGNOSIS — N185 Chronic kidney disease, stage 5: Secondary | ICD-10-CM | POA: Insufficient documentation

## 2014-06-19 DIAGNOSIS — E669 Obesity, unspecified: Secondary | ICD-10-CM | POA: Insufficient documentation

## 2014-06-20 ENCOUNTER — Encounter (HOSPITAL_COMMUNITY)
Admission: RE | Admit: 2014-06-20 | Discharge: 2014-06-20 | Disposition: A | Discharge status: Home / Self Care | Payer: BC Managed Care – PPO | Source: Ambulatory Visit

## 2014-06-20 ENCOUNTER — Encounter (HOSPITAL_COMMUNITY)
Admission: RE | Admit: 2014-06-20 | Discharge: 2014-06-20 | Disposition: A | Discharge status: Home / Self Care | Payer: BC Managed Care – PPO | Source: Ambulatory Visit | Attending: Nephrology | Admitting: Nephrology

## 2014-06-20 DIAGNOSIS — D638 Anemia in other chronic diseases classified elsewhere: Secondary | ICD-10-CM | POA: Diagnosis not present

## 2014-06-20 LAB — FERRITIN: Ferritin: 528 ng/mL — ABNORMAL HIGH (ref 22–322)

## 2014-06-20 LAB — HEMOGLOBIN AND HEMATOCRIT, BLOOD
HCT: 33.1 % — ABNORMAL LOW (ref 39.0–52.0)
Hemoglobin: 11.4 g/dL — ABNORMAL LOW (ref 13.0–17.0)

## 2014-06-20 LAB — IRON AND TIBC
Iron: 96 ug/dL (ref 42–165)
SATURATION RATIOS: 35 % (ref 20–55)
TIBC: 271 ug/dL (ref 215–435)
UIBC: 175 ug/dL (ref 125–400)

## 2014-06-20 MED ORDER — EPOETIN ALFA 10000 UNIT/ML IJ SOLN
INTRAMUSCULAR | Status: AC
  Filled 2014-06-20: qty 1

## 2014-06-20 MED ORDER — EPOETIN ALFA 10000 UNIT/ML IJ SOLN
10000.0000 [IU] | INTRAMUSCULAR | Status: DC
  Administered 2014-06-20: 10000 [IU] via SUBCUTANEOUS

## 2014-06-21 ENCOUNTER — Other Ambulatory Visit (HOSPITAL_COMMUNITY): Payer: BC Managed Care – PPO

## 2014-06-21 ENCOUNTER — Inpatient Hospital Stay (HOSPITAL_COMMUNITY): Admission: RE | Admit: 2014-06-21 | Payer: BC Managed Care – PPO | Source: Ambulatory Visit

## 2014-06-28 ENCOUNTER — Encounter (HOSPITAL_COMMUNITY)
Admission: RE | Admit: 2014-06-28 | Discharge: 2014-06-28 | Disposition: A | Discharge status: Home / Self Care | Payer: BC Managed Care – PPO | Source: Ambulatory Visit | Attending: Nephrology | Admitting: Nephrology

## 2014-06-28 DIAGNOSIS — D638 Anemia in other chronic diseases classified elsewhere: Secondary | ICD-10-CM | POA: Diagnosis not present

## 2014-06-28 LAB — HEMOGLOBIN AND HEMATOCRIT, BLOOD
HCT: 34.2 % — ABNORMAL LOW (ref 39.0–52.0)
HEMOGLOBIN: 11.4 g/dL — AB (ref 13.0–17.0)

## 2014-06-28 MED ORDER — EPOETIN ALFA 10000 UNIT/ML IJ SOLN
INTRAMUSCULAR | Status: AC
  Filled 2014-06-28: qty 1

## 2014-06-28 MED ORDER — EPOETIN ALFA 10000 UNIT/ML IJ SOLN
10000.0000 [IU] | INTRAMUSCULAR | Status: DC
  Administered 2014-06-28: 10000 [IU] via SUBCUTANEOUS

## 2014-06-28 NOTE — Progress Notes (Signed)
Results for Steven Perez, Steven Perez (MRN [id number]) as of 06/28/2014 08:03  Ref. Range 06/28/2014 07:00  Hemoglobin Latest Range: 13.0-17.0 g/dL 11.4 (L)  HCT Latest Range: 39.0-52.0 % 34.2 (L)

## 2014-07-04 ENCOUNTER — Encounter (HOSPITAL_COMMUNITY)
Admission: RE | Admit: 2014-07-04 | Discharge: 2014-07-04 | Disposition: A | Discharge status: Home / Self Care | Payer: BC Managed Care – PPO | Source: Ambulatory Visit | Attending: Nephrology | Admitting: Nephrology

## 2014-07-04 ENCOUNTER — Encounter (HOSPITAL_COMMUNITY): Payer: Self-pay

## 2014-07-04 DIAGNOSIS — N183 Chronic kidney disease, stage 3 (moderate): Secondary | ICD-10-CM | POA: Diagnosis not present

## 2014-07-04 DIAGNOSIS — D631 Anemia in chronic kidney disease: Secondary | ICD-10-CM | POA: Insufficient documentation

## 2014-07-04 LAB — HEMOGLOBIN AND HEMATOCRIT, BLOOD
HCT: 35.2 % — ABNORMAL LOW (ref 39.0–52.0)
Hemoglobin: 12.1 g/dL — ABNORMAL LOW (ref 13.0–17.0)

## 2014-07-04 NOTE — Progress Notes (Signed)
Results for Steven Perez, Steven Perez (MRN [id number]) as of 07/04/2014 07:14  Labs prior to Procrit injection.  No injection today due to lab parameters. Will recheck/appointment 07/19/2014   Ref. Range 07/04/2014 06:50  Hemoglobin Latest Range: 13.0-17.0 g/dL 12.1 (L)  HCT Latest Range: 39.0-52.0 % 35.2 (L)

## 2014-07-12 ENCOUNTER — Ambulatory Visit (HOSPITAL_COMMUNITY): Payer: BC Managed Care – PPO

## 2014-07-12 ENCOUNTER — Other Ambulatory Visit (HOSPITAL_COMMUNITY): Payer: BC Managed Care – PPO

## 2014-07-19 ENCOUNTER — Encounter (HOSPITAL_COMMUNITY)
Admission: RE | Admit: 2014-07-19 | Discharge: 2014-07-19 | Disposition: A | Discharge status: Home / Self Care | Payer: BC Managed Care – PPO | Source: Ambulatory Visit | Attending: Nephrology | Admitting: Nephrology

## 2014-07-19 DIAGNOSIS — D631 Anemia in chronic kidney disease: Secondary | ICD-10-CM | POA: Diagnosis not present

## 2014-07-19 LAB — HEMOGLOBIN AND HEMATOCRIT, BLOOD
HCT: 33.9 % — ABNORMAL LOW (ref 39.0–52.0)
Hemoglobin: 11.4 g/dL — ABNORMAL LOW (ref 13.0–17.0)

## 2014-07-19 MED ORDER — EPOETIN ALFA 10000 UNIT/ML IJ SOLN
10000.0000 [IU] | Freq: Once | INTRAMUSCULAR | Status: AC
  Administered 2014-07-19: 10000 [IU] via SUBCUTANEOUS

## 2014-07-19 MED ORDER — EPOETIN ALFA 10000 UNIT/ML IJ SOLN
INTRAMUSCULAR | Status: AC
Start: 2014-07-19 — End: 2014-07-19
  Filled 2014-07-19: qty 1

## 2014-07-19 NOTE — Progress Notes (Signed)
Results for GOD, PEACHES (MRN [id number]) as of 07/19/2014 07:42  Ref. Range 07/19/2014 07:11  Hemoglobin Latest Range: 13.0-17.0 g/dL 11.4 (L)  HCT Latest Range: 39.0-52.0 % 33.9 (L)

## 2014-07-26 ENCOUNTER — Encounter (HOSPITAL_COMMUNITY)
Admission: RE | Admit: 2014-07-26 | Discharge: 2014-07-26 | Disposition: A | Discharge status: Home / Self Care | Payer: BC Managed Care – PPO | Source: Ambulatory Visit | Attending: Nephrology | Admitting: Nephrology

## 2014-07-26 DIAGNOSIS — D631 Anemia in chronic kidney disease: Secondary | ICD-10-CM | POA: Diagnosis not present

## 2014-07-26 LAB — HEMOGLOBIN AND HEMATOCRIT, BLOOD
HCT: 34.9 % — ABNORMAL LOW (ref 39.0–52.0)
Hemoglobin: 11.8 g/dL — ABNORMAL LOW (ref 13.0–17.0)

## 2014-07-26 LAB — IRON AND TIBC
Iron: 66 ug/dL (ref 42–165)
SATURATION RATIOS: 24 % (ref 20–55)
TIBC: 275 ug/dL (ref 215–435)
UIBC: 209 ug/dL (ref 125–400)

## 2014-07-26 LAB — FERRITIN: FERRITIN: 457 ng/mL — AB (ref 22–322)

## 2014-08-09 ENCOUNTER — Encounter (HOSPITAL_COMMUNITY)
Admission: RE | Admit: 2014-08-09 | Discharge: 2014-08-09 | Disposition: A | Discharge status: Home / Self Care | Payer: BC Managed Care – PPO | Source: Ambulatory Visit | Attending: Nephrology | Admitting: Nephrology

## 2014-08-09 DIAGNOSIS — N183 Chronic kidney disease, stage 3 (moderate): Secondary | ICD-10-CM | POA: Insufficient documentation

## 2014-08-09 DIAGNOSIS — D638 Anemia in other chronic diseases classified elsewhere: Secondary | ICD-10-CM | POA: Insufficient documentation

## 2014-08-09 LAB — HEMOGLOBIN AND HEMATOCRIT, BLOOD
HEMATOCRIT: 33.8 % — AB (ref 39.0–52.0)
HEMOGLOBIN: 11.5 g/dL — AB (ref 13.0–17.0)

## 2014-08-09 NOTE — Progress Notes (Signed)
Here for labs and procrit if indicated. Dx CKD. Anemia.

## 2014-08-09 NOTE — Progress Notes (Signed)
Results for PATIENCE, BENION (MRN [id number]) as of 08/09/2014 07:31  Ref. Range 08/09/2014 07:05  Hemoglobin Latest Range: 13.0-17.0 g/dL 11.5 (L)  HCT Latest Range: 39.0-52.0 % 33.8 (L)

## 2014-08-22 ENCOUNTER — Encounter (HOSPITAL_COMMUNITY): Payer: Self-pay

## 2014-08-22 ENCOUNTER — Encounter (HOSPITAL_COMMUNITY)
Admission: RE | Admit: 2014-08-22 | Discharge: 2014-08-22 | Disposition: A | Discharge status: Home / Self Care | Payer: BC Managed Care – PPO | Source: Ambulatory Visit | Attending: Nephrology | Admitting: Nephrology

## 2014-08-22 DIAGNOSIS — D638 Anemia in other chronic diseases classified elsewhere: Secondary | ICD-10-CM | POA: Diagnosis not present

## 2014-08-22 LAB — IRON AND TIBC
Iron: 102 ug/dL (ref 42–165)
Saturation Ratios: 37 % (ref 20–55)
TIBC: 278 ug/dL (ref 215–435)
UIBC: 176 ug/dL (ref 125–400)

## 2014-08-22 LAB — HEMOGLOBIN AND HEMATOCRIT, BLOOD
HCT: 35.2 % — ABNORMAL LOW (ref 39.0–52.0)
HEMOGLOBIN: 12 g/dL — AB (ref 13.0–17.0)

## 2014-08-22 LAB — FERRITIN: FERRITIN: 483 ng/mL — AB (ref 22–322)

## 2014-08-26 NOTE — Progress Notes (Signed)
Results for TAMAJ, DAMBROSI (MRN [id number]) as of 08/26/2014 11:00  Ref. Range 08/22/2014 06:55  Iron Latest Range: 42-165 ug/dL 102  UIBC Latest Range: 125-400 ug/dL 176  TIBC Latest Range: 215-435 ug/dL 278  Saturation Ratios Latest Range: 20-55 % 37  Ferritin Latest Range: 22-322 ng/mL 483 (H)  Hemoglobin Latest Range: 13.0-17.0 g/dL 12.0 (L)  HCT Latest Range: 39.0-52.0 % 35.2 (L)

## 2014-09-06 ENCOUNTER — Encounter (HOSPITAL_COMMUNITY)
Admission: RE | Admit: 2014-09-06 | Discharge: 2014-09-06 | Disposition: A | Discharge status: Home / Self Care | Payer: BC Managed Care – PPO | Source: Ambulatory Visit | Attending: Nephrology | Admitting: Nephrology

## 2014-09-06 ENCOUNTER — Encounter (HOSPITAL_COMMUNITY): Payer: Self-pay

## 2014-09-06 DIAGNOSIS — Z01818 Encounter for other preprocedural examination: Secondary | ICD-10-CM | POA: Insufficient documentation

## 2014-09-06 DIAGNOSIS — N183 Chronic kidney disease, stage 3 (moderate): Secondary | ICD-10-CM | POA: Diagnosis not present

## 2014-09-06 DIAGNOSIS — D638 Anemia in other chronic diseases classified elsewhere: Secondary | ICD-10-CM | POA: Diagnosis not present

## 2014-09-06 LAB — HEMOGLOBIN AND HEMATOCRIT, BLOOD
HCT: 35.2 % — ABNORMAL LOW (ref 39.0–52.0)
Hemoglobin: 12 g/dL — ABNORMAL LOW (ref 13.0–17.0)

## 2014-09-06 MED ORDER — EPOETIN ALFA 10000 UNIT/ML IJ SOLN: 10000.0000 [IU] | INTRAMUSCULAR | Status: DC

## 2014-09-06 NOTE — Progress Notes (Signed)
Results for EESA, TYSKA (MRN [id number]) as of 09/06/2014 07:37  Ref. Range 09/06/2014 07:00  Hemoglobin Latest Range: 13.0-17.0 g/dL 12.0 (L)  HCT Latest Range: 39.0-52.0 % 35.2 (L)   No Procrit was given hemoglobin above range.

## 2014-09-17 ENCOUNTER — Ambulatory Visit (INDEPENDENT_AMBULATORY_CARE_PROVIDER_SITE_OTHER): Payer: 59 | Admitting: Urology

## 2014-09-17 DIAGNOSIS — N2889 Other specified disorders of kidney and ureter: Secondary | ICD-10-CM

## 2014-09-20 ENCOUNTER — Encounter (HOSPITAL_COMMUNITY)
Admission: RE | Admit: 2014-09-20 | Discharge: 2014-09-20 | Disposition: A | Discharge status: Home / Self Care | Payer: BC Managed Care – PPO | Source: Ambulatory Visit | Attending: Nephrology | Admitting: Nephrology

## 2014-09-20 DIAGNOSIS — Z01818 Encounter for other preprocedural examination: Secondary | ICD-10-CM | POA: Diagnosis not present

## 2014-09-20 LAB — HEMOGLOBIN AND HEMATOCRIT, BLOOD
HEMATOCRIT: 35.1 % — AB (ref 39.0–52.0)
HEMOGLOBIN: 12 g/dL — AB (ref 13.0–17.0)

## 2014-09-20 LAB — IRON AND TIBC
Iron: 83 ug/dL (ref 42–165)
SATURATION RATIOS: 29 % (ref 20–55)
TIBC: 290 ug/dL (ref 215–435)
UIBC: 207 ug/dL (ref 125–400)

## 2014-09-20 LAB — FERRITIN: FERRITIN: 349 ng/mL — AB (ref 22–322)

## 2014-09-20 MED ORDER — EPOETIN ALFA 10000 UNIT/ML IJ SOLN: 10000.0000 [IU] | INTRAMUSCULAR | Status: DC

## 2014-09-20 NOTE — Progress Notes (Addendum)
Results for Steven Perez, Steven Perez (MRN [id number]) as of 09/20/2014 13:22  Ref. Range 09/20/2014 06:55  Hemoglobin Latest Ref Range: 13.0-17.0 g/dL 12.0 (L)  HCT Latest Ref Range: 39.0-52.0 % 35.1 (L)  Results for Steven Perez, Steven Perez (MRN [id number]) as of 09/23/2014 07:31 Completed labs from visit 09/20/2014  Ref. Range 09/20/2014 06:55  Iron Latest Ref Range: 42-165 ug/dL 83  UIBC Latest Ref Range: 125-400 ug/dL 207  TIBC Latest Ref Range: 215-435 ug/dL 290  Saturation Ratios Latest Ref Range: 20-55 % 29  Ferritin Latest Ref Range: 22-322 ng/mL 349 (H)  Hemoglobin Latest Ref Range: 13.0-17.0 g/dL 12.0 (L)  HCT Latest Ref Range: 39.0-52.0 % 35.1 (L)

## 2014-09-20 NOTE — Progress Notes (Signed)
Patient did not receive injection due to Hemoglobin 12.0

## 2014-10-04 ENCOUNTER — Encounter (HOSPITAL_COMMUNITY)
Admission: RE | Admit: 2014-10-04 | Discharge: 2014-10-04 | Disposition: A | Discharge status: Home / Self Care | Payer: 59 | Source: Ambulatory Visit | Attending: Nephrology | Admitting: Nephrology

## 2014-10-04 DIAGNOSIS — N183 Chronic kidney disease, stage 3 (moderate): Secondary | ICD-10-CM | POA: Insufficient documentation

## 2014-10-04 DIAGNOSIS — D631 Anemia in chronic kidney disease: Secondary | ICD-10-CM | POA: Insufficient documentation

## 2014-10-04 LAB — HEMOGLOBIN AND HEMATOCRIT, BLOOD
HEMATOCRIT: 34 % — AB (ref 39.0–52.0)
HEMOGLOBIN: 11.4 g/dL — AB (ref 13.0–17.0)

## 2014-10-04 MED ORDER — EPOETIN ALFA 10000 UNIT/ML IJ SOLN
INTRAMUSCULAR | Status: AC
  Filled 2014-10-04: qty 1

## 2014-10-04 MED ORDER — EPOETIN ALFA 10000 UNIT/ML IJ SOLN
10000.0000 [IU] | Freq: Once | INTRAMUSCULAR | Status: AC
  Administered 2014-10-04: 10000 [IU] via SUBCUTANEOUS

## 2014-10-04 NOTE — Progress Notes (Signed)
Results for Steven Perez, Steven Perez (MRN [id number]) as of 10/04/2014 07:29  Ref. Range 10/04/2014 07:00  Hemoglobin Latest Ref Range: 13.0-17.0 g/dL 11.4 (L)  HCT Latest Ref Range: 39.0-52.0 % 34.0 (L)

## 2014-10-04 NOTE — Progress Notes (Signed)
Here for labs and procrit if indicated. Dx CKD and anemia.

## 2014-10-18 ENCOUNTER — Encounter (HOSPITAL_COMMUNITY)
Admission: RE | Admit: 2014-10-18 | Discharge: 2014-10-18 | Disposition: A | Discharge status: Home / Self Care | Payer: 59 | Source: Ambulatory Visit | Attending: Nephrology | Admitting: Nephrology

## 2014-10-18 DIAGNOSIS — N183 Chronic kidney disease, stage 3 (moderate): Secondary | ICD-10-CM | POA: Diagnosis not present

## 2014-10-18 LAB — IRON AND TIBC
Iron: 62 ug/dL (ref 45–182)
SATURATION RATIOS: 21 % (ref 17.9–39.5)
TIBC: 293 ug/dL (ref 250–450)
UIBC: 231 ug/dL

## 2014-10-18 LAB — FERRITIN: Ferritin: 240 ng/mL (ref 24–336)

## 2014-10-18 LAB — HEMOGLOBIN AND HEMATOCRIT, BLOOD
HEMATOCRIT: 33.6 % — AB (ref 39.0–52.0)
Hemoglobin: 11.1 g/dL — ABNORMAL LOW (ref 13.0–17.0)

## 2014-10-18 MED ORDER — EPOETIN ALFA 10000 UNIT/ML IJ SOLN
INTRAMUSCULAR | Status: AC
  Filled 2014-10-18: qty 1

## 2014-10-18 MED ORDER — EPOETIN ALFA 20000 UNIT/ML IJ SOLN: 10000.0000 [IU] | INTRAMUSCULAR | Status: DC

## 2014-10-18 MED ORDER — EPOETIN ALFA 10000 UNIT/ML IJ SOLN
10000.0000 [IU] | INTRAMUSCULAR | Status: DC
  Administered 2014-10-18: 10000 [IU] via SUBCUTANEOUS

## 2014-10-21 NOTE — Progress Notes (Signed)
Results for Steven Perez, Steven Perez (MRN [id number]) as of 10/21/2014 07:03  Ref. Range 10/18/2014 07:30  Iron Latest Ref Range: 45-182 ug/dL 62  UIBC Latest Units: ug/dL 231  TIBC Latest Ref Range: 250-450 ug/dL 293  Saturation Ratios Latest Ref Range: 17.9-39.5 % 21  Ferritin Latest Ref Range: 24-336 ng/mL 240  Hemoglobin Latest Ref Range: 13.0-17.0 g/dL 11.1 (L)  HCT Latest Ref Range: 39.0-52.0 % 33.6 (L)

## 2014-11-01 ENCOUNTER — Encounter (HOSPITAL_COMMUNITY)
Admission: RE | Admit: 2014-11-01 | Discharge: 2014-11-01 | Disposition: A | Discharge status: Home / Self Care | Payer: 59 | Source: Ambulatory Visit | Attending: Nephrology | Admitting: Nephrology

## 2014-11-01 ENCOUNTER — Encounter (HOSPITAL_COMMUNITY): Payer: Self-pay

## 2014-11-01 DIAGNOSIS — D638 Anemia in other chronic diseases classified elsewhere: Secondary | ICD-10-CM | POA: Insufficient documentation

## 2014-11-01 DIAGNOSIS — N183 Chronic kidney disease, stage 3 (moderate): Secondary | ICD-10-CM | POA: Diagnosis not present

## 2014-11-01 LAB — HEMOGLOBIN AND HEMATOCRIT, BLOOD
HEMATOCRIT: 36.5 % — AB (ref 39.0–52.0)
Hemoglobin: 12.3 g/dL — ABNORMAL LOW (ref 13.0–17.0)

## 2014-11-01 NOTE — Progress Notes (Signed)
Results for Steven Perez, Steven Perez (MRN [id number]) as of 11/01/2014 11:28 No Procrit today, appointment for recheck 11/15/2014.   Ref. Range 11/01/2014 07:10  Hemoglobin Latest Ref Range: 13.0-17.0 g/dL 12.3 (L)  HCT Latest Ref Range: 39.0-52.0 % 36.5 (L)

## 2014-11-07 ENCOUNTER — Other Ambulatory Visit (HOSPITAL_COMMUNITY): Payer: Self-pay | Admitting: Interventional Radiology

## 2014-11-07 DIAGNOSIS — N2889 Other specified disorders of kidney and ureter: Secondary | ICD-10-CM

## 2014-11-14 ENCOUNTER — Other Ambulatory Visit (HOSPITAL_COMMUNITY): Payer: Self-pay | Admitting: Interventional Radiology

## 2014-11-15 ENCOUNTER — Encounter (HOSPITAL_COMMUNITY)
Admission: RE | Admit: 2014-11-15 | Discharge: 2014-11-15 | Disposition: A | Discharge status: Home / Self Care | Payer: 59 | Source: Ambulatory Visit | Attending: Nephrology | Admitting: Nephrology

## 2014-11-15 DIAGNOSIS — D638 Anemia in other chronic diseases classified elsewhere: Secondary | ICD-10-CM | POA: Diagnosis not present

## 2014-11-15 LAB — HEMOGLOBIN AND HEMATOCRIT, BLOOD
HEMATOCRIT: 35.8 % — AB (ref 39.0–52.0)
Hemoglobin: 12.1 g/dL — ABNORMAL LOW (ref 13.0–17.0)

## 2014-11-15 LAB — FERRITIN: Ferritin: 390 ng/mL — ABNORMAL HIGH (ref 24–336)

## 2014-11-15 LAB — IRON AND TIBC
Iron: 93 ug/dL (ref 45–182)
SATURATION RATIOS: 32 % (ref 17.9–39.5)
TIBC: 294 ug/dL (ref 250–450)
UIBC: 201 ug/dL

## 2014-11-15 MED ORDER — EPOETIN ALFA 10000 UNIT/ML IJ SOLN: 10000.0000 [IU] | INTRAMUSCULAR | Status: DC

## 2014-11-15 NOTE — Progress Notes (Signed)
Patient did not receive shot due to hemaglobin greaterr than 11.5

## 2014-11-18 NOTE — Progress Notes (Signed)
Results for ELON, SWETLAND (MRN [id number]) as of 11/18/2014 08:44  Ref. Range 11/15/2014 07:10  Iron Latest Ref Range: 45-182 ug/dL 93  UIBC Latest Units: ug/dL 201  TIBC Latest Ref Range: 250-450 ug/dL 294  Saturation Ratios Latest Ref Range: 17.9-39.5 % 32  Ferritin Latest Ref Range: 24-336 ng/mL 390 (H)  Hemoglobin Latest Ref Range: 13.0-17.0 g/dL 12.1 (L)  HCT Latest Ref Range: 39.0-52.0 % 35.8 (L)

## 2014-11-27 ENCOUNTER — Ambulatory Visit
Admission: RE | Admit: 2014-11-27 | Discharge: 2014-11-27 | Disposition: A | Discharge status: Home / Self Care | Payer: 59 | Source: Ambulatory Visit | Attending: Interventional Radiology | Admitting: Interventional Radiology

## 2014-11-27 ENCOUNTER — Ambulatory Visit (HOSPITAL_COMMUNITY)
Admission: RE | Admit: 2014-11-27 | Discharge: 2014-11-27 | Disposition: A | Discharge status: Home / Self Care | Payer: 59 | Source: Ambulatory Visit | Attending: Interventional Radiology | Admitting: Interventional Radiology

## 2014-11-27 DIAGNOSIS — C649 Malignant neoplasm of unspecified kidney, except renal pelvis: Secondary | ICD-10-CM | POA: Diagnosis not present

## 2014-11-27 DIAGNOSIS — N2889 Other specified disorders of kidney and ureter: Secondary | ICD-10-CM

## 2014-11-29 ENCOUNTER — Encounter (HOSPITAL_COMMUNITY)
Admission: RE | Admit: 2014-11-29 | Discharge: 2014-11-29 | Disposition: A | Discharge status: Home / Self Care | Payer: Commercial Managed Care - HMO | Source: Ambulatory Visit | Attending: Nephrology | Admitting: Nephrology

## 2014-11-29 ENCOUNTER — Encounter (HOSPITAL_COMMUNITY): Payer: Self-pay

## 2014-11-29 DIAGNOSIS — Z5181 Encounter for therapeutic drug level monitoring: Secondary | ICD-10-CM | POA: Insufficient documentation

## 2014-11-29 DIAGNOSIS — Z79899 Other long term (current) drug therapy: Secondary | ICD-10-CM | POA: Diagnosis not present

## 2014-11-29 DIAGNOSIS — D631 Anemia in chronic kidney disease: Secondary | ICD-10-CM | POA: Insufficient documentation

## 2014-11-29 DIAGNOSIS — N183 Chronic kidney disease, stage 3 (moderate): Secondary | ICD-10-CM | POA: Diagnosis present

## 2014-11-29 LAB — HEMOGLOBIN AND HEMATOCRIT, BLOOD
HEMATOCRIT: 35.7 % — AB (ref 39.0–52.0)
HEMOGLOBIN: 12.1 g/dL — AB (ref 13.0–17.0)

## 2014-11-29 NOTE — Progress Notes (Signed)
Results for Steven Perez, Steven Perez (MRN [id number]) as of 11/29/2014 07:29 Labs drawn today, no procrit required, appointment made for 12/13/2014 for recheck.   Ref. Range 11/29/2014 07:00  Hemoglobin Latest Ref Range: 13.0-17.0 g/dL 12.1 (L)  HCT Latest Ref Range: 39.0-52.0 % 35.7 (L)

## 2014-12-05 ENCOUNTER — Other Ambulatory Visit: Payer: Self-pay | Admitting: Interventional Radiology

## 2014-12-05 DIAGNOSIS — N2889 Other specified disorders of kidney and ureter: Secondary | ICD-10-CM

## 2014-12-12 DIAGNOSIS — N2889 Other specified disorders of kidney and ureter: Secondary | ICD-10-CM | POA: Insufficient documentation

## 2014-12-12 NOTE — Progress Notes (Signed)
Chief Complaint: Chief Complaint  Patient presents with  . Follow-up    Left renal mass    History of Present Illness: Steven Perez is a 64 y.o. male with a history of a complex cystic mass of the left kidney and chronic kidney disease, stage IV. This septated cystic lesion showed slight enlargement by MRI between June, 2015 and December, 2015. Given its predominately cystic characteristics, inability to administer gadolinium and significant chronic kidney disease, it was elected to follow the lesion.  Chronic kidney disease is followed by Dr. Posey Pronto. Recently, his renal function has shown some improvement with latest creatinine of 2.93 and estimated GFR of 25 mL per minute on 10/15/2014. He has not needed dialysis.  Past Medical History  Diagnosis Date  . Diabetes mellitus   . Hypertension   . HLD (hyperlipidemia)   . Left renal mass   . Erectile dysfunction due to arterial insufficiency   . Chronic kidney disease     Stage 4  . Shortness of breath dyspnea   . Anemia     assoc w/ chronic reanl failure  . Secondary hyperparathyroidism of renal origin   . Acute on chronic renal insufficiency   . Obesity   . Arthralgia   . Insomnia   . Dyslipidemia     Past Surgical History  Procedure Laterality Date  . Tonsillectomy    . Renal biopsy, percutaneous    . Renal biopsy      Allergies: Tetanus toxoids and Contrast media  Medications: Prior to Admission medications   Medication Sig Start Date End Date Taking? Authorizing Provider  acetaminophen (TYLENOL) 500 MG tablet Take 500 mg by mouth at bedtime as needed. Pain   Yes Historical Provider, MD  carvedilol (COREG) 6.25 MG tablet Take 12.5 mg by mouth 2 (two) times daily with a meal.    Yes Historical Provider, MD  furosemide (LASIX) 40 MG tablet Take 40 mg by mouth.   Yes Historical Provider, MD  insulin glargine (LANTUS) 100 UNIT/ML injection Inject 20 Units into the skin at bedtime.   Yes Historical Provider, MD    simvastatin (ZOCOR) 80 MG tablet Take 40 mg by mouth at bedtime.   Yes Historical Provider, MD  tadalafil (CIALIS) 10 MG tablet Take 10 mg by mouth daily as needed. Sexual Arousal   Yes Historical Provider, MD  traZODone (DESYREL) 50 MG tablet Take 100 mg by mouth at bedtime.   Yes Historical Provider, MD  verapamil (CALAN-SR) 180 MG CR tablet Take 2 tablets (360 mg total) by mouth at bedtime. 12/04/13  Yes Janece Canterbury, MD  calcitRIOL (ROCALTROL) 0.25 MCG capsule Take 0.25 mcg by mouth daily.    Historical Provider, MD     Family History  Problem Relation Age of Onset  . Diabetes Mellitus II Mother   . CAD Neg Hx   . Stroke Neg Hx     History   Social History  . Marital Status: Married    Spouse Name: N/A  . Number of Children: N/A  . Years of Education: N/A   Social History Main Topics  . Smoking status: Former Research scientist (life sciences)  . Smokeless tobacco: Not on file  . Alcohol Use: Yes     Comment: occasionally  . Drug Use: No  . Sexual Activity: Not on file   Other Topics Concern  . Not on file   Social History Narrative    Review of Systems: A 12 point ROS discussed and pertinent positives are indicated  in the HPI above.  All other systems are negative.  Review of Systems  Constitutional: Negative.   Respiratory: Negative.   Cardiovascular: Negative.   Gastrointestinal: Negative.   Genitourinary: Negative.   Musculoskeletal: Negative.      Vital Signs: BP 165/100 mmHg  Pulse 67  Temp(Src) 98.5 F (36.9 C) (Oral)  Resp 13  Ht 5\' 8"  (1.727 m)  Wt 234 lb (106.142 kg)  BMI 35.59 kg/m2  SpO2 100%  Physical Exam  Constitutional: He is oriented to person, place, and time. He appears well-developed and well-nourished. No distress.  Abdominal: Soft. He exhibits no distension. There is no tenderness.  Neurological: He is alert and oriented to person, place, and time.  Skin: He is not diaphoretic.  Nursing note and vitals reviewed.   Mallampati Score:      Imaging: Mr Abdomen Wo Contrast  11/27/2014   CLINICAL DATA:  Renal cancer follow up.  EXAM: MRI ABDOMEN WITHOUT CONTRAST  TECHNIQUE: Multiplanar multisequence MR imaging was performed without the administration of intravenous contrast.  COMPARISON:  Multiple exams, including 05/28/2014  FINDINGS: Lower chest:  Unremarkable  Hepatobiliary: Low T2 signal in the liver may be due to iron deposition.  Pancreas: Unremarkable  Spleen: Unremarkable  Adrenals/Urinary Tract: Left mid upper kidney multi septated cystic lesion, 2.4 by 2.1 by 2.3 cm, primarily thin internal septa observed by my measurements, on 05/28/2014 this measured 2.0 by 2.2 by 1.9 cm.  Stomach/Bowel: Unremarkable  Vascular/Lymphatic: Unremarkable  Other: No supplemental non-categorized findings.  Musculoskeletal: T11 left eccentric hemangioma.  More  IMPRESSION: 1. Mild increase in size in the septated left kidney upper pole cystic lesion. Bosniak classification cannot be assigned due to the lack of IV contrast. However, there was some suspicion for enhancement on 10/31/2013. Morphologically this appears multi-septated but not obviously nodular. No adenopathy. 2. Low T2 signal in the liver, possibly from hemochromatosis.   Electronically Signed   By: Van Clines M.D.   On: 11/27/2014 13:33    Labs:  CBC:  Recent Labs  10/18/14 0730 11/01/14 0710 11/15/14 0710 11/29/14 0700  HGB 11.1* 12.3* 12.1* 12.1*  HCT 33.6* 36.5* 35.8* 35.7*    COAGS: No results for input(s): INR, APTT in the last 8760 hours.  BMP: No results for input(s): NA, K, CL, CO2, GLUCOSE, BUN, CALCIUM, CREATININE, GFRNONAA, GFRAA in the last 8760 hours.  Invalid input(s): CMP  LIVER FUNCTION TESTS: No results for input(s): BILITOT, AST, ALT, ALKPHOS, PROT, ALBUMIN in the last 8760 hours.  TUMOR MARKERS: No results for input(s): AFPTM, CEA, CA199, CHROMGRNA in the last 8760 hours.  Assessment and Plan:  MRI today shows slight enlargement of the  septated cystic lesion of the upper pole of the left kidney since prior MRI 6 months ago. This lesion now measures roughly 2.4 x 2.1 x 2.3 cm, representing roughly 3-4 mm growth over the last 6 months. Although the lesion could still be benign, given enlargement, I discussed the possibility of biopsy with the patient. A predominantly cystic lesion would likely be an aspiration of fluid for cytologic analysis. The patient would like to proceed with biopsy. The procedure will be performed under ultrasound guidance. The procedure will be scheduled as an outpatient.   SignedAletta Edouard T 12/12/2014, 8:30 AM     I spent a total of 15 Minutes in face to face in clinical consultation, greater than 50% of which was counseling/coordinating care for follow-up of a left renal mass.

## 2014-12-13 ENCOUNTER — Encounter (HOSPITAL_COMMUNITY)
Admission: RE | Admit: 2014-12-13 | Discharge: 2014-12-13 | Disposition: A | Discharge status: Home / Self Care | Payer: Commercial Managed Care - HMO | Source: Ambulatory Visit | Attending: Nephrology | Admitting: Nephrology

## 2014-12-13 ENCOUNTER — Encounter (HOSPITAL_COMMUNITY): Payer: Self-pay

## 2014-12-13 DIAGNOSIS — N183 Chronic kidney disease, stage 3 (moderate): Secondary | ICD-10-CM | POA: Diagnosis not present

## 2014-12-13 LAB — HEMOGLOBIN AND HEMATOCRIT, BLOOD
HCT: 33.3 % — ABNORMAL LOW (ref 39.0–52.0)
HEMOGLOBIN: 11.5 g/dL — AB (ref 13.0–17.0)

## 2014-12-13 NOTE — Progress Notes (Signed)
Results for CONSTANTINE, AULET (MRN [id number]) as of 12/13/2014 08:41  Labs of 12/13/2014, no indication for Procrit injection today as per order indicates. Next visit 12/27/2014  Ref. Range 12/13/2014 07:10  Hemoglobin Latest Ref Range: 13.0-17.0 g/dL 11.5 (L)  HCT Latest Ref Range: 39.0-52.0 % 33.3 (L)

## 2014-12-18 ENCOUNTER — Other Ambulatory Visit: Payer: Self-pay | Admitting: Radiology

## 2014-12-19 ENCOUNTER — Ambulatory Visit (HOSPITAL_COMMUNITY)
Admission: RE | Admit: 2014-12-19 | Discharge: 2014-12-19 | Disposition: A | Discharge status: Home / Self Care | Payer: Commercial Managed Care - HMO | Source: Ambulatory Visit | Attending: Interventional Radiology | Admitting: Interventional Radiology

## 2014-12-19 ENCOUNTER — Encounter (HOSPITAL_COMMUNITY): Payer: Self-pay

## 2014-12-19 DIAGNOSIS — N2889 Other specified disorders of kidney and ureter: Secondary | ICD-10-CM | POA: Diagnosis present

## 2014-12-19 LAB — CBC
HEMATOCRIT: 31.9 % — AB (ref 39.0–52.0)
Hemoglobin: 11.1 g/dL — ABNORMAL LOW (ref 13.0–17.0)
MCH: 28.4 pg (ref 26.0–34.0)
MCHC: 34.8 g/dL (ref 30.0–36.0)
MCV: 81.6 fL (ref 78.0–100.0)
PLATELETS: 199 10*3/uL (ref 150–400)
RBC: 3.91 MIL/uL — ABNORMAL LOW (ref 4.22–5.81)
RDW: 13.2 % (ref 11.5–15.5)
WBC: 8.8 10*3/uL (ref 4.0–10.5)

## 2014-12-19 LAB — PROTIME-INR
INR: 1.05 (ref 0.00–1.49)
Prothrombin Time: 13.9 seconds (ref 11.6–15.2)

## 2014-12-19 LAB — GLUCOSE, CAPILLARY: GLUCOSE-CAPILLARY: 93 mg/dL (ref 65–99)

## 2014-12-19 LAB — APTT: aPTT: 32 seconds (ref 24–37)

## 2014-12-19 MED ORDER — FENTANYL CITRATE (PF) 100 MCG/2ML IJ SOLN
INTRAMUSCULAR | Status: AC | PRN
  Administered 2014-12-19: 25 ug via INTRAVENOUS
  Administered 2014-12-19: 50 ug via INTRAVENOUS

## 2014-12-19 MED ORDER — HYDROCODONE-ACETAMINOPHEN 5-325 MG PO TABS: 1.0000 | ORAL_TABLET | ORAL | Status: DC | PRN

## 2014-12-19 MED ORDER — SODIUM CHLORIDE 0.9 % IV SOLN
INTRAVENOUS | Status: DC
  Administered 2014-12-19: 12:00:00 via INTRAVENOUS

## 2014-12-19 MED ORDER — MIDAZOLAM HCL 2 MG/2ML IJ SOLN
INTRAMUSCULAR | Status: DC
Start: 2014-12-19 — End: 2014-12-20
  Filled 2014-12-19: qty 4

## 2014-12-19 MED ORDER — MIDAZOLAM HCL 2 MG/2ML IJ SOLN
INTRAMUSCULAR | Status: AC | PRN
  Administered 2014-12-19: 0.5 mg via INTRAVENOUS
  Administered 2014-12-19: 1 mg via INTRAVENOUS

## 2014-12-19 MED ORDER — FLUMAZENIL 0.5 MG/5ML IV SOLN
INTRAVENOUS | Status: DC
Start: 2014-12-19 — End: 2014-12-19
  Filled 2014-12-19: qty 5

## 2014-12-19 MED ORDER — NALOXONE HCL 0.4 MG/ML IJ SOLN
INTRAMUSCULAR | Status: AC
  Filled 2014-12-19: qty 1

## 2014-12-19 MED ORDER — FENTANYL CITRATE (PF) 100 MCG/2ML IJ SOLN
INTRAMUSCULAR | Status: AC
  Filled 2014-12-19: qty 2

## 2014-12-19 NOTE — H&P (Signed)
HPI: Patient with enlarging Left renal complex cystic mass, CKD stage 4 who has been seen in full consult on 12/12/14 with Dr. Kathlene Cote and scheduled today for image guided left renal mass aspiration/biopsy.  The patient has had a H&P performed within the last 30 days, all history, medications, and exam have been reviewed. The patient denies any interval changes since the H&P.  Medications: Prior to Admission medications   Medication Sig Start Date End Date Taking? Authorizing Provider  calcitRIOL (ROCALTROL) 0.25 MCG capsule Take 0.25 mcg by mouth daily.   Yes Historical Provider, MD  carvedilol (COREG) 6.25 MG tablet Take 12.5 mg by mouth 2 (two) times daily with a meal.    Yes Historical Provider, MD  furosemide (LASIX) 40 MG tablet Take 40 mg by mouth 2 (two) times daily.    Yes Historical Provider, MD  insulin glargine (LANTUS) 100 UNIT/ML injection Inject 20 Units into the skin at bedtime.   Yes Historical Provider, MD  simvastatin (ZOCOR) 40 MG tablet Take 40 mg by mouth daily.   Yes Historical Provider, MD  traZODone (DESYREL) 100 MG tablet Take 100 mg by mouth at bedtime.   Yes Historical Provider, MD  verapamil (CALAN-SR) 180 MG CR tablet Take 2 tablets (360 mg total) by mouth at bedtime. 12/04/13  Yes Janece Canterbury, MD  acetaminophen (TYLENOL) 500 MG tablet Take 500 mg by mouth at bedtime as needed for mild pain. Pain    Historical Provider, MD  tadalafil (CIALIS) 10 MG tablet Take 10 mg by mouth daily as needed. Sexual Arousal    Historical Provider, MD    Vital Signs: BP 163/86 mmHg  Pulse 66  Temp(Src) 98.6 F (37 C) (Oral)  Resp 16  Ht 5\' 8"  (1.727 m)  Wt 234 lb (106.142 kg)  BMI 35.59 kg/m2  SpO2 99%  Physical Exam  Constitutional: He is oriented to person, place, and time. No distress.  HENT:  Head: Normocephalic and atraumatic.  Neck: No tracheal deviation present.  Cardiovascular: Normal rate and regular rhythm.  Exam reveals no gallop and no friction rub.   No  murmur heard. Pulmonary/Chest: Effort normal and breath sounds normal. No respiratory distress. He has no wheezes. He has no rales.  Abdominal: Soft. Bowel sounds are normal. He exhibits no distension. There is no tenderness.  No CVA tenderness  Neurological: He is alert and oriented to person, place, and time.  Skin: Skin is warm and dry. He is not diaphoretic.    Mallampati Score:  MD Evaluation Airway: WNL Heart: WNL Abdomen: WNL Chest/ Lungs: WNL ASA  Classification: 3 Mallampati/Airway Score: Two  Labs:  CBC:  Recent Labs  11/15/14 0710 11/29/14 0700 12/13/14 0710 12/19/14 1130  WBC  --   --   --  8.8  HGB 12.1* 12.1* 11.5* 11.1*  HCT 35.8* 35.7* 33.3* 31.9*  PLT  --   --   --  199    COAGS:  Recent Labs  12/19/14 1130  INR 1.05  APTT 32    Assessment/Plan:  Enlarging Left renal complex cystic mass CKD stage 4 Seen in full consult on 12/12/14 with Dr. Kathlene Cote Scheduled today for image guided left renal mass aspiration/biopsy with sedation The patient has been NPO, no blood thinners taken, labs and vitals have been reviewed. Risks and Benefits discussed with the patient including, but not limited to bleeding, infection, damage to adjacent structures or low yield requiring additional tests. All of the patient's questions were answered, patient is agreeable  to proceed. Consent signed and in chart.   SignedHedy Jacob 12/19/2014, 1:11 PM

## 2014-12-19 NOTE — Procedures (Signed)
Interventional Radiology Procedure Note  Procedure:  Ultrasound guided aspiration of cystic left renal mass  Complications:  None  Estimated Blood Loss: <10 mL  Findings:  4 mL of blood-tinged fluid aspirated from left posterior upper pole septated cystic lesion.  Sent for cytologic analysis.  Venetia Night. Kathlene Cote, M.D Pager:  [phone]

## 2014-12-19 NOTE — Discharge Instructions (Signed)
Kidney Biopsy, Care After Refer to this sheet in the next few weeks. These instructions provide you with information on caring for yourself after your procedure. Your health care provider may also give you more specific instructions. Your treatment has been planned according to current medical practices, but problems sometimes occur. Call your health care provider if you have any problems or questions after your procedure.  WHAT TO EXPECT AFTER THE PROCEDURE   You may notice blood in the urine for the first 24 hours after the biopsy.  You may feel some pain at the biopsy site for 1-2 weeks after the biopsy. HOME CARE INSTRUCTIONS  Do not lift anything heavier than 10 lb (4.5 kg) for 2 weeks.  Do not take any non-steroidal anti-inflammatory drugs (NSAIDs) or any blood thinners for a week after the biopsy unless instructed to do so by your health care provider.  Only take medicines for pain, fever, or discomfort as directed by your health care provider. SEEK MEDICAL CARE IF:  You have bloody urine more than 24 hours after the biopsy.   You develop a fever.   You cannot urinate.   You have increasing pain at the biopsy site.  SEEK IMMEDIATE MEDICAL CARE IF: You feel faint or dizzy.  Document Released: 01/17/2013 Document Reviewed: 01/17/2013 [facility] Patient Information 2015 [place]. This information is not intended to replace advice given to you by your health care provider. Make sure you discuss any questions you have with your health care provider. Kidney Biopsy A biopsy is a test that involves collecting small pieces of tissue, usually with a needle. The tissue is then examined under a microscope. A kidney biopsy can help a health care provider make a diagnosis and determine the best course of treatment. Your health care provider may recommend a kidney biopsy if you have any of the following conditions:  Blood in your urine (hematuria).  Excessive protein in your urine  (proteinuria).  Impaired kidney function that causes excessive waste products in your blood. A specialist will look at the kidney tissue samples to check for unusual deposits, scarring, or infecting organisms that would explain your condition. If you have a kidney transplant, a biopsy can also help explain why a transplanted kidney is not working properly. Talk with your health care provider about what information might be learned from the biopsy and the risks involved. This can help you make a decision about whether a biopsy is worthwhile in your case. LET [facility] CARE PROVIDER KNOW ABOUT:  Any allergies you have.  All medicines you are taking, including vitamins, herbs, eye drops, creams, and over-the-counter medicines.  Previous problems you or members of your family have had with the use of anesthetics.  Any blood disorders you have.  Previous surgeries you have had.  Medical conditions you have. RISKS AND COMPLICATIONS Generally, a kidney biopsy is a safe procedure. However, as with any procedure, complications can occur. Possible complications include:  Infection.  Bleeding. BEFORE THE PROCEDURE  Make sure you understand the need for a biopsy.  Do not eat or drink for 8 hours before the test or as directed by your health care provider.  You will need to give blood and urine samples before the biopsy. This is to make sure you do not have a condition where you should not have a biopsy. PROCEDURE Kidney biopsies are usually done in a hospital. During the procedure, you may be fully awake with light sedation, or you may be asleep under  general anesthesia. The entire procedure usually takes an hour.  You will lie on your stomach to position the kidneys near the surface of your back. If you have a transplanted kidney, you will lie on your back.  The health care provider will inject a local painkiller. For a through-the-skin (percutaneous) biopsy, the health care provider will  use a locating needle and X-ray or ultrasound equipment to find the right spot.  A collecting needle will be used to gather the tissue. If you are awake, you will be asked to hold your breath as the needle is inserted and collects the tissue. Each insertion and collection lasts about 30 seconds or a little longer. You will be told when to exhale. AFTER THE PROCEDURE  You will lie on your back for 12 to 24 hours. If you have a transplanted kidney, you may not have to lie on your back. During this time, your back will probably feel sore. You may stay in the hospital overnight after the procedure so that staff can check your condition.  You may notice some blood in your urine for 24 hours after the test. To detect any problems, your health care providers will:  Monitor your blood pressure and pulse.  Take blood samples to measure the amount of red blood cells.  Examine the urine that you pass.  On rare occasions when bleeding is excessive, it may be necessary to replace lost blood with a transfusion.  It is your responsibility to obtain your test results. Ask the lab or department performing the test when and how you will get your results. FOR MORE INFORMATION  [employer]: https://mathis.com/  National Kidney Foundation: www.kidney.org  National Kidney and Urologic Diseases Information Clearinghouse: http://kidney.AmenCredit.is Document Released: 03/27/2004 Document Revised: 03/07/2013 Document Reviewed: 11/20/2012 [facility] Patient Information 2015 [place], [place]. This information is not intended to replace advice given to you by your health care provider. Make sure you discuss any questions you have with your health care provider. Conscious Sedation Sedation is the use of medicines to promote relaxation and relieve discomfort and anxiety. Conscious sedation is a type of sedation. Under conscious sedation you are less alert than normal but are still able to respond to instructions or  stimulation. Conscious sedation is used during short medical and dental procedures. It is milder than deep sedation or general anesthesia and allows you to return to your regular activities sooner.  LET [facility] CARE PROVIDER KNOW ABOUT:   Any allergies you have.  All medicines you are taking, including vitamins, herbs, eye drops, creams, and over-the-counter medicines.  Use of steroids (by mouth or creams).  Previous problems you or members of your family have had with the use of anesthetics.  Any blood disorders you have.  Previous surgeries you have had.  Medical conditions you have.  Possibility of pregnancy, if this applies.  Use of cigarettes, alcohol, or illegal drugs. RISKS AND COMPLICATIONS Generally, this is a safe procedure. However, as with any procedure, problems can occur. Possible problems include:  Oversedation.  Trouble breathing on your own. You may need to have a breathing tube until you are awake and breathing on your own.  Allergic reaction to any of the medicines used for the procedure. BEFORE THE PROCEDURE  You may have blood tests done. These tests can help show how well your kidneys and liver are working. They can also show how well your blood clots.  A physical exam will be done.  Only take medicines as directed  by your health care provider. You may need to stop taking medicines (such as blood thinners, aspirin, or nonsteroidal anti-inflammatory drugs) before the procedure.   Do not eat or drink at least 6 hours before the procedure or as directed by your health care provider.  Arrange for a responsible adult, family member, or friend to take you home after the procedure. He or she should stay with you for at least 24 hours after the procedure, until the medicine has worn off. PROCEDURE   An intravenous (IV) catheter will be inserted into one of your veins. Medicine will be able to flow directly into your body through this catheter. You may be  given medicine through this tube to help prevent pain and help you relax.  The medical or dental procedure will be done. AFTER THE PROCEDURE  You will stay in a recovery area until the medicine has worn off. Your blood pressure and pulse will be checked.   Depending on the procedure you had, you may be allowed to go home when you can tolerate liquids and your pain is under control. Document Released: 02/09/2001 Document Revised: 05/22/2013 Document Reviewed: 01/22/2013 [facility] Patient Information 2015 [place], [place]. This information is not intended to replace advice given to you by your health care provider. Make sure you discuss any questions you have with your health care provider. Conscious Sedation, Adult, Care After Refer to this sheet in the next few weeks. These instructions provide you with information on caring for yourself after your procedure. Your health care provider may also give you more specific instructions. Your treatment has been planned according to current medical practices, but problems sometimes occur. Call your health care provider if you have any problems or questions after your procedure. WHAT TO EXPECT AFTER THE PROCEDURE  After your procedure:  You may feel sleepy, clumsy, and have poor balance for several hours.  Vomiting may occur if you eat too soon after the procedure. HOME CARE INSTRUCTIONS  Do not participate in any activities where you could become injured for at least 24 hours. Do not:  Drive.  Swim.  Ride a bicycle.  Operate heavy machinery.  Cook.  Use power tools.  Climb ladders.  Work from a high place.  Do not make important decisions or sign legal documents until you are improved.  If you vomit, drink water, juice, or soup when you can drink without vomiting. Make sure you have little or no nausea before eating solid foods.  Only take over-the-counter or prescription medicines for pain, discomfort, or fever as directed by your  health care provider.  Make sure you and your family fully understand everything about the medicines given to you, including what side effects may occur.  You should not drink alcohol, take sleeping pills, or take medicines that cause drowsiness for at least 24 hours.  If you smoke, do not smoke without supervision.  If you are feeling better, you may resume normal activities 24 hours after you were sedated.  Keep all appointments with your health care provider. SEEK MEDICAL CARE IF:  Your skin is pale or bluish in color.  You continue to feel nauseous or vomit.  Your pain is getting worse and is not helped by medicine.  You have bleeding or swelling.  You are still sleepy or feeling clumsy after 24 hours. SEEK IMMEDIATE MEDICAL CARE IF:  You develop a rash.  You have difficulty breathing.  You develop any type of allergic problem.  You have a fever.  MAKE SURE YOU:  Understand these instructions.  Will watch your condition.  Will get help right away if you are not doing well or get worse. Document Released: 03/07/2013 Document Reviewed: 03/07/2013 [facility] Patient Information 2015 [place], [place]. This information is not intended to replace advice given to you by your health care provider. Make sure you discuss any questions you have with your health care provider.

## 2014-12-24 ENCOUNTER — Telehealth: Payer: Self-pay | Admitting: Interventional Radiology

## 2014-12-24 NOTE — Telephone Encounter (Signed)
Gave Steven Perez cytology results from cystic lesion aspiration of left kidney.  Benign fluid without evidence of malignant cells. Recommended follow up MRI of abdomen w/o contrast in 12 months.

## 2014-12-27 ENCOUNTER — Encounter (HOSPITAL_COMMUNITY)
Admission: RE | Admit: 2014-12-27 | Discharge: 2014-12-27 | Disposition: A | Discharge status: Home / Self Care | Payer: Commercial Managed Care - HMO | Source: Ambulatory Visit | Attending: Nephrology | Admitting: Nephrology

## 2014-12-27 DIAGNOSIS — N183 Chronic kidney disease, stage 3 (moderate): Secondary | ICD-10-CM | POA: Diagnosis not present

## 2014-12-27 LAB — IRON AND TIBC
Iron: 85 ug/dL (ref 45–182)
SATURATION RATIOS: 27 % (ref 17.9–39.5)
TIBC: 321 ug/dL (ref 250–450)
UIBC: 236 ug/dL

## 2014-12-27 LAB — HEMOGLOBIN AND HEMATOCRIT, BLOOD
HCT: 33 % — ABNORMAL LOW (ref 39.0–52.0)
HEMOGLOBIN: 11.4 g/dL — AB (ref 13.0–17.0)

## 2014-12-27 LAB — FERRITIN: Ferritin: 405 ng/mL — ABNORMAL HIGH (ref 24–336)

## 2014-12-27 MED ORDER — EPOETIN ALFA 10000 UNIT/ML IJ SOLN
INTRAMUSCULAR | Status: AC
  Filled 2014-12-27: qty 1

## 2014-12-27 MED ORDER — EPOETIN ALFA 10000 UNIT/ML IJ SOLN
10000.0000 [IU] | INTRAMUSCULAR | Status: DC
  Administered 2014-12-27: 10000 [IU] via SUBCUTANEOUS

## 2014-12-27 NOTE — Progress Notes (Signed)
Results for CAIDE, FRIEDMAN (MRN [id number]) as of 12/27/2014 11:41  Ref. Range 12/27/2014 06:30  Hemoglobin Latest Ref Range: 13.0-17.0 g/dL 11.4 (L)  HCT Latest Ref Range: 39.0-52.0 % 33.0 (L)

## 2015-01-10 ENCOUNTER — Encounter (HOSPITAL_COMMUNITY)
Admission: RE | Admit: 2015-01-10 | Discharge: 2015-01-10 | Disposition: A | Discharge status: Home / Self Care | Payer: Commercial Managed Care - HMO | Source: Ambulatory Visit | Attending: Nephrology | Admitting: Nephrology

## 2015-01-10 DIAGNOSIS — N183 Chronic kidney disease, stage 3 (moderate): Secondary | ICD-10-CM | POA: Diagnosis present

## 2015-01-10 DIAGNOSIS — D638 Anemia in other chronic diseases classified elsewhere: Secondary | ICD-10-CM | POA: Diagnosis present

## 2015-01-10 LAB — HEMOGLOBIN AND HEMATOCRIT, BLOOD
HCT: 33.7 % — ABNORMAL LOW (ref 39.0–52.0)
HEMOGLOBIN: 11.7 g/dL — AB (ref 13.0–17.0)

## 2015-01-10 MED ORDER — EPOETIN ALFA 10000 UNIT/ML IJ SOLN: 10000.0000 [IU] | Freq: Once | INTRAMUSCULAR | Status: DC

## 2015-01-10 NOTE — Progress Notes (Signed)
Patient Hgb 11.7 did not require injection. Patient to return in 2 weeks

## 2015-01-10 NOTE — Progress Notes (Signed)
Results for Steven Perez, Steven Perez (MRN [id number]) as of 01/10/2015 07:31  Ref. Range 01/10/2015 07:05  Hemoglobin Latest Ref Range: 13.0-17.0 g/dL 11.7 (L)  HCT Latest Ref Range: 39.0-52.0 % 33.7 (L)    injection not given

## 2015-01-24 ENCOUNTER — Encounter (HOSPITAL_COMMUNITY)
Admission: RE | Admit: 2015-01-24 | Discharge: 2015-01-24 | Disposition: A | Discharge status: Home / Self Care | Payer: Commercial Managed Care - HMO | Source: Ambulatory Visit | Attending: Nephrology | Admitting: Nephrology

## 2015-01-24 DIAGNOSIS — N183 Chronic kidney disease, stage 3 (moderate): Secondary | ICD-10-CM | POA: Diagnosis not present

## 2015-01-24 LAB — HEMOGLOBIN AND HEMATOCRIT, BLOOD
HCT: 34.7 % — ABNORMAL LOW (ref 39.0–52.0)
HEMOGLOBIN: 12.1 g/dL — AB (ref 13.0–17.0)

## 2015-01-24 NOTE — Progress Notes (Signed)
Results for GIRARD, DOBIS (MRN [id number]) as of 01/24/2015 07:22  Ref. Range 01/24/2015 07:05  Hemoglobin Latest Ref Range: 13.0-17.0 g/dL 12.1 (L)  HCT Latest Ref Range: 39.0-52.0 % 34.7 (L)

## 2015-02-14 ENCOUNTER — Encounter (HOSPITAL_COMMUNITY)
Admission: RE | Admit: 2015-02-14 | Discharge: 2015-02-14 | Disposition: A | Discharge status: Home / Self Care | Payer: Commercial Managed Care - HMO | Source: Ambulatory Visit | Attending: Nephrology | Admitting: Nephrology

## 2015-02-14 DIAGNOSIS — Z5181 Encounter for therapeutic drug level monitoring: Secondary | ICD-10-CM | POA: Diagnosis not present

## 2015-02-14 DIAGNOSIS — D631 Anemia in chronic kidney disease: Secondary | ICD-10-CM | POA: Diagnosis not present

## 2015-02-14 DIAGNOSIS — N183 Chronic kidney disease, stage 3 (moderate): Secondary | ICD-10-CM | POA: Insufficient documentation

## 2015-02-14 DIAGNOSIS — Z79899 Other long term (current) drug therapy: Secondary | ICD-10-CM | POA: Insufficient documentation

## 2015-02-14 LAB — FERRITIN: FERRITIN: 329 ng/mL (ref 24–336)

## 2015-02-14 LAB — HEMOGLOBIN AND HEMATOCRIT, BLOOD
HEMATOCRIT: 32.9 % — AB (ref 39.0–52.0)
Hemoglobin: 11.4 g/dL — ABNORMAL LOW (ref 13.0–17.0)

## 2015-02-14 LAB — IRON AND TIBC
IRON: 74 ug/dL (ref 45–182)
Saturation Ratios: 25 % (ref 17.9–39.5)
TIBC: 291 ug/dL (ref 250–450)
UIBC: 217 ug/dL

## 2015-02-14 MED ORDER — EPOETIN ALFA 20000 UNIT/ML IJ SOLN: 10000.0000 [IU] | Freq: Once | INTRAMUSCULAR | Status: DC

## 2015-02-14 MED ORDER — EPOETIN ALFA 10000 UNIT/ML IJ SOLN
10000.0000 [IU] | Freq: Once | INTRAMUSCULAR | Status: AC
  Administered 2015-02-14: 10000 [IU] via SUBCUTANEOUS

## 2015-02-14 MED ORDER — EPOETIN ALFA 10000 UNIT/ML IJ SOLN
INTRAMUSCULAR | Status: AC
  Filled 2015-02-14: qty 1

## 2015-02-14 NOTE — Progress Notes (Signed)
Results for Steven Perez, Steven Perez (MRN [id number]) as of 02/14/2015 15:08  Ref. Range 02/14/2015 06:50 02/14/2015 07:08  Iron Latest Ref Range: 45-182 ug/dL  74  UIBC Latest Units: ug/dL  217  TIBC Latest Ref Range: 250-450 ug/dL  291  Saturation Ratios Latest Ref Range: 17.9-39.5 %  25  Ferritin Latest Ref Range: 24-336 ng/mL  329  Hemoglobin Latest Ref Range: 13.0-17.0 g/dL 11.4 (L)   HCT Latest Ref Range: 39.0-52.0 % 32.9 (L)

## 2015-03-07 ENCOUNTER — Encounter (HOSPITAL_COMMUNITY)
Admission: RE | Admit: 2015-03-07 | Discharge: 2015-03-07 | Disposition: A | Discharge status: Home / Self Care | Payer: Commercial Managed Care - HMO | Source: Ambulatory Visit | Attending: Nephrology | Admitting: Nephrology

## 2015-03-07 ENCOUNTER — Encounter (HOSPITAL_COMMUNITY): Payer: Self-pay

## 2015-03-07 DIAGNOSIS — N183 Chronic kidney disease, stage 3 (moderate): Secondary | ICD-10-CM | POA: Diagnosis not present

## 2015-03-07 DIAGNOSIS — D638 Anemia in other chronic diseases classified elsewhere: Secondary | ICD-10-CM | POA: Diagnosis present

## 2015-03-07 LAB — HEMOGLOBIN AND HEMATOCRIT, BLOOD
HEMATOCRIT: 33.1 % — AB (ref 39.0–52.0)
HEMOGLOBIN: 11.4 g/dL — AB (ref 13.0–17.0)

## 2015-03-07 MED ORDER — EPOETIN ALFA 20000 UNIT/ML IJ SOLN: 10000.0000 [IU] | Freq: Once | INTRAMUSCULAR | Status: DC

## 2015-03-07 MED ORDER — EPOETIN ALFA 10000 UNIT/ML IJ SOLN
INTRAMUSCULAR | Status: AC
  Filled 2015-03-07: qty 1

## 2015-03-07 MED ORDER — EPOETIN ALFA 20000 UNIT/ML IJ SOLN
10000.0000 [IU] | Freq: Once | INTRAMUSCULAR | Status: DC
Start: 2015-03-07 — End: 2015-03-07

## 2015-03-07 MED ORDER — EPOETIN ALFA 10000 UNIT/ML IJ SOLN
10000.0000 [IU] | Freq: Once | INTRAMUSCULAR | Status: AC
  Administered 2015-03-07: 10000 [IU] via SUBCUTANEOUS

## 2015-03-07 NOTE — Progress Notes (Signed)
Results for RONALDINHO, KINSTLE (MRN [id number]) as of 03/07/2015 07:48  Patient required dosage of 10,000 units of Procrit today per order, next visit 03/28/2015.   Ref. Range 03/07/2015 07:00  Hemoglobin Latest Ref Range: 13.0-17.0 g/dL 11.4 (L)  HCT Latest Ref Range: 39.0-52.0 % 33.1 (L)

## 2015-03-18 ENCOUNTER — Ambulatory Visit (INDEPENDENT_AMBULATORY_CARE_PROVIDER_SITE_OTHER): Payer: Commercial Managed Care - HMO | Admitting: Urology

## 2015-03-18 DIAGNOSIS — R3129 Other microscopic hematuria: Secondary | ICD-10-CM | POA: Diagnosis not present

## 2015-03-18 DIAGNOSIS — N5201 Erectile dysfunction due to arterial insufficiency: Secondary | ICD-10-CM | POA: Diagnosis not present

## 2015-03-18 DIAGNOSIS — N2889 Other specified disorders of kidney and ureter: Secondary | ICD-10-CM | POA: Diagnosis not present

## 2015-03-28 ENCOUNTER — Encounter (HOSPITAL_COMMUNITY)
Admission: RE | Admit: 2015-03-28 | Discharge: 2015-03-28 | Disposition: A | Discharge status: Home / Self Care | Payer: Commercial Managed Care - HMO | Source: Ambulatory Visit | Attending: Nephrology | Admitting: Nephrology

## 2015-03-28 ENCOUNTER — Encounter (HOSPITAL_COMMUNITY): Payer: Self-pay

## 2015-03-28 DIAGNOSIS — N183 Chronic kidney disease, stage 3 (moderate): Secondary | ICD-10-CM | POA: Diagnosis not present

## 2015-03-28 LAB — HEMOGLOBIN AND HEMATOCRIT, BLOOD
HEMATOCRIT: 33.8 % — AB (ref 39.0–52.0)
HEMOGLOBIN: 11.5 g/dL — AB (ref 13.0–17.0)

## 2015-03-28 LAB — IRON AND TIBC
Iron: 68 ug/dL (ref 45–182)
Saturation Ratios: 23 % (ref 17.9–39.5)
TIBC: 294 ug/dL (ref 250–450)
UIBC: 226 ug/dL

## 2015-03-28 LAB — FERRITIN: Ferritin: 279 ng/mL (ref 24–336)

## 2015-03-28 MED ORDER — EPOETIN ALFA 10000 UNIT/ML IJ SOLN
10000.0000 [IU] | INTRAMUSCULAR | Status: DC
Start: 2015-03-28 — End: 2015-03-29

## 2015-03-28 NOTE — Progress Notes (Signed)
Results for Steven Perez, Steven Perez (MRN [id number]) as of 03/28/2015 13:27  Ref. Range 03/28/2015 07:10  Iron Latest Ref Range: 45-182 ug/dL 68  UIBC Latest Units: ug/dL 226  TIBC Latest Ref Range: 250-450 ug/dL 294  Saturation Ratios Latest Ref Range: 17.9-39.5 % 23  Ferritin Latest Ref Range: 24-336 ng/mL 279  Hemoglobin Latest Ref Range: 13.0-17.0 g/dL 11.5 (L)  HCT Latest Ref Range: 39.0-52.0 % 33.8 (L)

## 2015-04-11 ENCOUNTER — Encounter (HOSPITAL_COMMUNITY)
Admission: RE | Admit: 2015-04-11 | Discharge: 2015-04-11 | Disposition: A | Discharge status: Home / Self Care | Payer: Commercial Managed Care - HMO | Source: Ambulatory Visit | Attending: Nephrology | Admitting: Nephrology

## 2015-04-11 DIAGNOSIS — N183 Chronic kidney disease, stage 3 (moderate): Secondary | ICD-10-CM | POA: Insufficient documentation

## 2015-04-11 DIAGNOSIS — Z5181 Encounter for therapeutic drug level monitoring: Secondary | ICD-10-CM | POA: Diagnosis not present

## 2015-04-11 DIAGNOSIS — D631 Anemia in chronic kidney disease: Secondary | ICD-10-CM | POA: Insufficient documentation

## 2015-04-11 DIAGNOSIS — Z79899 Other long term (current) drug therapy: Secondary | ICD-10-CM | POA: Diagnosis not present

## 2015-04-11 LAB — HEMOGLOBIN AND HEMATOCRIT, BLOOD
HEMATOCRIT: 34.2 % — AB (ref 39.0–52.0)
HEMOGLOBIN: 11.8 g/dL — AB (ref 13.0–17.0)

## 2015-05-01 MED ORDER — EPOETIN ALFA 10000 UNIT/ML IJ SOLN
10000.0000 [IU] | INTRAMUSCULAR | Status: DC
Start: 2015-05-01 — End: 2015-05-01

## 2015-05-02 ENCOUNTER — Encounter (HOSPITAL_COMMUNITY)
Admission: RE | Admit: 2015-05-02 | Discharge: 2015-05-02 | Disposition: A | Discharge status: Home / Self Care | Payer: Commercial Managed Care - HMO | Source: Ambulatory Visit | Attending: Nephrology | Admitting: Nephrology

## 2015-05-02 ENCOUNTER — Encounter (HOSPITAL_COMMUNITY): Payer: Commercial Managed Care - HMO

## 2015-05-02 DIAGNOSIS — D638 Anemia in other chronic diseases classified elsewhere: Secondary | ICD-10-CM | POA: Insufficient documentation

## 2015-05-02 DIAGNOSIS — N183 Chronic kidney disease, stage 3 (moderate): Secondary | ICD-10-CM | POA: Diagnosis present

## 2015-05-02 LAB — IRON AND TIBC
Iron: 65 ug/dL (ref 45–182)
Saturation Ratios: 22 % (ref 17.9–39.5)
TIBC: 298 ug/dL (ref 250–450)
UIBC: 233 ug/dL

## 2015-05-02 LAB — HEMOGLOBIN AND HEMATOCRIT, BLOOD
HCT: 34.8 % — ABNORMAL LOW (ref 39.0–52.0)
Hemoglobin: 12 g/dL — ABNORMAL LOW (ref 13.0–17.0)

## 2015-05-02 LAB — FERRITIN: Ferritin: 365 ng/mL — ABNORMAL HIGH (ref 24–336)

## 2015-05-02 NOTE — Progress Notes (Signed)
Results for OLUWAPELUMI, REINECK (MRN [id number]) as of 05/02/2015 07:18  Ref. Range 05/02/2015 07:10  Hemoglobin Latest Ref Range: 13.0-17.0 g/dL 12.0 (L)  HCT Latest Ref Range: 39.0-52.0 % 34.8 (L)

## 2015-05-16 ENCOUNTER — Encounter (HOSPITAL_COMMUNITY)
Admission: RE | Admit: 2015-05-16 | Discharge: 2015-05-16 | Disposition: A | Discharge status: Home / Self Care | Payer: Commercial Managed Care - HMO | Source: Ambulatory Visit | Attending: Nephrology | Admitting: Nephrology

## 2015-05-16 DIAGNOSIS — N183 Chronic kidney disease, stage 3 (moderate): Secondary | ICD-10-CM | POA: Diagnosis not present

## 2015-05-16 LAB — HEMOGLOBIN AND HEMATOCRIT, BLOOD
HEMATOCRIT: 34.3 % — AB (ref 39.0–52.0)
Hemoglobin: 11.5 g/dL — ABNORMAL LOW (ref 13.0–17.0)

## 2015-05-16 NOTE — Progress Notes (Signed)
Results for Steven Perez, Steven Perez (MRN [id number]) as of 05/16/2015 08:02  Ref. Range 05/16/2015 07:15  Hemoglobin Latest Ref Range: 13.0-17.0 g/dL 11.5 (L)  HCT Latest Ref Range: 39.0-52.0 % 34.3 (L)

## 2015-06-06 ENCOUNTER — Encounter (HOSPITAL_COMMUNITY)
Admission: RE | Admit: 2015-06-06 | Discharge: 2015-06-06 | Disposition: A | Discharge status: Home / Self Care | Payer: Commercial Managed Care - HMO | Source: Ambulatory Visit | Attending: Nephrology | Admitting: Nephrology

## 2015-06-06 ENCOUNTER — Encounter (HOSPITAL_COMMUNITY): Payer: Self-pay

## 2015-06-06 DIAGNOSIS — N184 Chronic kidney disease, stage 4 (severe): Secondary | ICD-10-CM | POA: Diagnosis not present

## 2015-06-06 DIAGNOSIS — D638 Anemia in other chronic diseases classified elsewhere: Secondary | ICD-10-CM | POA: Diagnosis not present

## 2015-06-06 LAB — FERRITIN: FERRITIN: 450 ng/mL — AB (ref 24–336)

## 2015-06-06 LAB — IRON AND TIBC
IRON: 65 ug/dL (ref 45–182)
SATURATION RATIOS: 22 % (ref 17.9–39.5)
TIBC: 291 ug/dL (ref 250–450)
UIBC: 226 ug/dL

## 2015-06-06 LAB — HEMOGLOBIN AND HEMATOCRIT, BLOOD
HEMATOCRIT: 34.2 % — AB (ref 39.0–52.0)
HEMOGLOBIN: 11.6 g/dL — AB (ref 13.0–17.0)

## 2015-06-06 NOTE — Progress Notes (Signed)
Results for OSWALD, JUSINO (MRN [id number]) as of 06/06/2015 14:07  Labs today, no procrit required next appointment for repeat blood work 06/20/2015    Ref. Range 06/06/2015 07:00  Iron Latest Ref Range: 45-182 ug/dL 65  UIBC Latest Units: ug/dL 226  TIBC Latest Ref Range: 250-450 ug/dL 291  Saturation Ratios Latest Ref Range: 17.9-39.5 % 22  Ferritin Latest Ref Range: 24-336 ng/mL 450 (H)  Hemoglobin Latest Ref Range: 13.0-17.0 g/dL 11.6 (L)  HCT Latest Ref Range: 39.0-52.0 % 34.2 (L)

## 2015-06-20 ENCOUNTER — Encounter (HOSPITAL_COMMUNITY)
Admission: RE | Admit: 2015-06-20 | Discharge: 2015-06-20 | Disposition: A | Discharge status: Home / Self Care | Payer: Commercial Managed Care - HMO | Source: Ambulatory Visit | Attending: Nephrology | Admitting: Nephrology

## 2015-06-20 ENCOUNTER — Encounter (HOSPITAL_COMMUNITY): Payer: Self-pay

## 2015-06-20 DIAGNOSIS — N184 Chronic kidney disease, stage 4 (severe): Secondary | ICD-10-CM | POA: Diagnosis not present

## 2015-06-20 LAB — HEMOGLOBIN AND HEMATOCRIT, BLOOD
HCT: 33.9 % — ABNORMAL LOW (ref 39.0–52.0)
Hemoglobin: 11.5 g/dL — ABNORMAL LOW (ref 13.0–17.0)

## 2015-06-20 NOTE — Progress Notes (Signed)
Results for KONGPHENG, ESPERANZA (MRN [id number]) as of 06/20/2015 07:47  Ref. Range 06/20/2015 07:09  Hemoglobin Latest Ref Range: 13.0-17.0 g/dL 11.5 (L)  HCT Latest Ref Range: 39.0-52.0 % 33.9 (L)

## 2015-06-27 ENCOUNTER — Ambulatory Visit (HOSPITAL_COMMUNITY): Payer: Commercial Managed Care - HMO

## 2015-06-27 ENCOUNTER — Other Ambulatory Visit (HOSPITAL_COMMUNITY): Payer: Commercial Managed Care - HMO

## 2015-07-04 ENCOUNTER — Encounter (HOSPITAL_COMMUNITY)
Admission: RE | Admit: 2015-07-04 | Discharge: 2015-07-04 | Disposition: A | Discharge status: Home / Self Care | Payer: Commercial Managed Care - HMO | Source: Ambulatory Visit | Attending: Nephrology | Admitting: Nephrology

## 2015-07-04 DIAGNOSIS — D638 Anemia in other chronic diseases classified elsewhere: Secondary | ICD-10-CM | POA: Diagnosis present

## 2015-07-04 DIAGNOSIS — N183 Chronic kidney disease, stage 3 (moderate): Secondary | ICD-10-CM | POA: Insufficient documentation

## 2015-07-04 LAB — HEMOGLOBIN AND HEMATOCRIT, BLOOD
HCT: 34.2 % — ABNORMAL LOW (ref 39.0–52.0)
Hemoglobin: 11.4 g/dL — ABNORMAL LOW (ref 13.0–17.0)

## 2015-07-04 MED ORDER — EPOETIN ALFA 10000 UNIT/ML IJ SOLN
10000.0000 [IU] | Freq: Once | INTRAMUSCULAR | Status: AC
  Administered 2015-07-04: 10000 [IU] via SUBCUTANEOUS

## 2015-07-04 MED ORDER — EPOETIN ALFA 10000 UNIT/ML IJ SOLN
INTRAMUSCULAR | Status: AC
  Filled 2015-07-04: qty 1

## 2015-07-04 NOTE — Progress Notes (Signed)
Results for SHORTY, SCHOENE (MRN [id number]) as of 07/04/2015 07:15  Ref. Range 07/04/2015 06:59  Hemoglobin Latest Ref Range: 13.0-17.0 g/dL 11.4 (L)  HCT Latest Ref Range: 39.0-52.0 % 34.2 (L)

## 2015-07-25 ENCOUNTER — Encounter (HOSPITAL_COMMUNITY)
Admission: RE | Admit: 2015-07-25 | Discharge: 2015-07-25 | Disposition: A | Discharge status: Home / Self Care | Payer: Commercial Managed Care - HMO | Source: Ambulatory Visit | Attending: Nephrology | Admitting: Nephrology

## 2015-07-25 DIAGNOSIS — N183 Chronic kidney disease, stage 3 (moderate): Secondary | ICD-10-CM | POA: Diagnosis not present

## 2015-07-25 LAB — HEMOGLOBIN AND HEMATOCRIT, BLOOD
HCT: 33.6 % — ABNORMAL LOW (ref 39.0–52.0)
Hemoglobin: 11.5 g/dL — ABNORMAL LOW (ref 13.0–17.0)

## 2015-07-25 LAB — IRON AND TIBC
IRON: 75 ug/dL (ref 45–182)
SATURATION RATIOS: 26 % (ref 17.9–39.5)
TIBC: 293 ug/dL (ref 250–450)
UIBC: 218 ug/dL

## 2015-07-25 LAB — FERRITIN: FERRITIN: 345 ng/mL — AB (ref 24–336)

## 2015-07-25 NOTE — Progress Notes (Signed)
Results for Steven Perez, Steven Perez (MRN [id number]) as of 07/25/2015 16:17  Ref. Range 07/25/2015 07:13  Iron Latest Ref Range: 45-182 ug/dL 75  UIBC Latest Units: ug/dL 218  TIBC Latest Ref Range: 250-450 ug/dL 293  Saturation Ratios Latest Ref Range: 17.9-39.5 % 26  Ferritin Latest Ref Range: 24-336 ng/mL 345 (H)  Hemoglobin Latest Ref Range: 13.0-17.0 g/dL 11.5 (L)  HCT Latest Ref Range: 39.0-52.0 % 33.6 (L)   No injection indicated per MD order.

## 2015-08-08 ENCOUNTER — Encounter (HOSPITAL_COMMUNITY)
Admission: RE | Admit: 2015-08-08 | Discharge: 2015-08-08 | Disposition: A | Discharge status: Home / Self Care | Payer: Commercial Managed Care - HMO | Source: Ambulatory Visit | Attending: Nephrology | Admitting: Nephrology

## 2015-08-08 DIAGNOSIS — N184 Chronic kidney disease, stage 4 (severe): Secondary | ICD-10-CM | POA: Insufficient documentation

## 2015-08-08 DIAGNOSIS — D638 Anemia in other chronic diseases classified elsewhere: Secondary | ICD-10-CM | POA: Diagnosis not present

## 2015-08-08 LAB — HEMOGLOBIN AND HEMATOCRIT, BLOOD
HCT: 34.7 % — ABNORMAL LOW (ref 39.0–52.0)
HEMOGLOBIN: 11.9 g/dL — AB (ref 13.0–17.0)

## 2015-08-08 NOTE — Progress Notes (Signed)
Results for ASPEN, JHA (MRN [id number]) as of 08/08/2015 07:36  Ref. Range 08/08/2015 06:58  Hemoglobin Latest Ref Range: 13.0-17.0 g/dL 11.9 (L)  HCT Latest Ref Range: 39.0-52.0 % 34.7 (L)

## 2015-08-22 ENCOUNTER — Encounter (HOSPITAL_COMMUNITY)
Admission: RE | Admit: 2015-08-22 | Discharge: 2015-08-22 | Disposition: A | Discharge status: Home / Self Care | Payer: Commercial Managed Care - HMO | Source: Ambulatory Visit | Attending: Nephrology | Admitting: Nephrology

## 2015-08-22 DIAGNOSIS — D638 Anemia in other chronic diseases classified elsewhere: Secondary | ICD-10-CM | POA: Diagnosis not present

## 2015-08-22 LAB — HEMOGLOBIN AND HEMATOCRIT, BLOOD
HEMATOCRIT: 33.7 % — AB (ref 39.0–52.0)
HEMOGLOBIN: 11.5 g/dL — AB (ref 13.0–17.0)

## 2015-08-22 NOTE — Progress Notes (Signed)
Results for Steven Perez, Steven Perez (MRN [id number]) as of 08/22/2015 07:17  Ref. Range 08/22/2015 06:55  Hemoglobin Latest Ref Range: 13.0-17.0 g/dL 11.5 (L)  HCT Latest Ref Range: 39.0-52.0 % 33.7 (L)   Procrit not administered

## 2015-09-05 ENCOUNTER — Encounter (HOSPITAL_COMMUNITY)
Admission: RE | Admit: 2015-09-05 | Discharge: 2015-09-05 | Disposition: A | Discharge status: Home / Self Care | Payer: Commercial Managed Care - HMO | Source: Ambulatory Visit | Attending: Nephrology | Admitting: Nephrology

## 2015-09-05 DIAGNOSIS — N183 Chronic kidney disease, stage 3 (moderate): Secondary | ICD-10-CM | POA: Insufficient documentation

## 2015-09-05 DIAGNOSIS — D638 Anemia in other chronic diseases classified elsewhere: Secondary | ICD-10-CM | POA: Insufficient documentation

## 2015-09-05 LAB — IRON AND TIBC
Iron: 67 ug/dL (ref 45–182)
SATURATION RATIOS: 22 % (ref 17.9–39.5)
TIBC: 309 ug/dL (ref 250–450)
UIBC: 242 ug/dL

## 2015-09-05 LAB — HEMOGLOBIN AND HEMATOCRIT, BLOOD
HEMATOCRIT: 31.8 % — AB (ref 39.0–52.0)
HEMOGLOBIN: 10.8 g/dL — AB (ref 13.0–17.0)

## 2015-09-05 LAB — FERRITIN: FERRITIN: 402 ng/mL — AB (ref 24–336)

## 2015-09-05 MED ORDER — EPOETIN ALFA 10000 UNIT/ML IJ SOLN
10000.0000 [IU] | INTRAMUSCULAR | Status: DC
  Administered 2015-09-05: 10000 [IU] via SUBCUTANEOUS

## 2015-09-05 MED ORDER — EPOETIN ALFA 10000 UNIT/ML IJ SOLN
INTRAMUSCULAR | Status: AC
  Filled 2015-09-05: qty 1

## 2015-09-05 NOTE — Progress Notes (Signed)
Results for AHMON, GAIR (MRN [id number]) as of 09/05/2015 07:40  Ref. Range 09/05/2015 07:20  Hemoglobin Latest Ref Range: 13.0-17.0 g/dL 10.8 (L)  HCT Latest Ref Range: 39.0-52.0 % 31.8 (L)   Procrit 10000 units administered

## 2015-09-26 ENCOUNTER — Encounter (HOSPITAL_COMMUNITY)
Admission: RE | Admit: 2015-09-26 | Discharge: 2015-09-26 | Disposition: A | Discharge status: Home / Self Care | Payer: Commercial Managed Care - HMO | Source: Ambulatory Visit | Attending: Nephrology | Admitting: Nephrology

## 2015-09-26 DIAGNOSIS — N183 Chronic kidney disease, stage 3 (moderate): Secondary | ICD-10-CM | POA: Diagnosis not present

## 2015-09-26 LAB — HEMOGLOBIN AND HEMATOCRIT, BLOOD
HCT: 32.3 % — ABNORMAL LOW (ref 39.0–52.0)
HEMOGLOBIN: 11.2 g/dL — AB (ref 13.0–17.0)

## 2015-09-26 MED ORDER — EPOETIN ALFA 10000 UNIT/ML IJ SOLN
INTRAMUSCULAR | Status: AC
  Filled 2015-09-26: qty 1

## 2015-09-26 MED ORDER — EPOETIN ALFA 10000 UNIT/ML IJ SOLN
10000.0000 [IU] | Freq: Once | INTRAMUSCULAR | Status: AC
  Administered 2015-09-26: 10000 [IU] via SUBCUTANEOUS

## 2015-09-26 NOTE — Progress Notes (Signed)
Results for Steven Perez, Steven Perez (MRN [id number]) as of 09/26/2015 07:32  Ref. Range 09/26/2015 07:05  Hemoglobin Latest Ref Range: 13.0-17.0 g/dL 11.2 (L)  HCT Latest Ref Range: 39.0-52.0 % 32.3 (L)   Procrit 10,000 units given per order.

## 2015-10-17 ENCOUNTER — Encounter (HOSPITAL_COMMUNITY)
Admission: RE | Admit: 2015-10-17 | Discharge: 2015-10-17 | Disposition: A | Discharge status: Home / Self Care | Payer: Commercial Managed Care - HMO | Source: Ambulatory Visit | Attending: Nephrology | Admitting: Nephrology

## 2015-10-17 DIAGNOSIS — N183 Chronic kidney disease, stage 3 (moderate): Secondary | ICD-10-CM | POA: Diagnosis present

## 2015-10-17 DIAGNOSIS — D638 Anemia in other chronic diseases classified elsewhere: Secondary | ICD-10-CM | POA: Diagnosis present

## 2015-10-17 LAB — IRON AND TIBC
IRON: 72 ug/dL (ref 45–182)
Saturation Ratios: 25 % (ref 17.9–39.5)
TIBC: 284 ug/dL (ref 250–450)
UIBC: 212 ug/dL

## 2015-10-17 LAB — HEMOGLOBIN AND HEMATOCRIT, BLOOD
HCT: 34.1 % — ABNORMAL LOW (ref 39.0–52.0)
Hemoglobin: 11.6 g/dL — ABNORMAL LOW (ref 13.0–17.0)

## 2015-10-17 LAB — FERRITIN: FERRITIN: 358 ng/mL — AB (ref 24–336)

## 2015-10-20 NOTE — Progress Notes (Signed)
Results for AKIEL, MARES (MRN [id number]) as of 10/20/2015 11:28  Ref. Range 10/17/2015 07:00  Iron Latest Ref Range: 45-182 ug/dL 72  UIBC Latest Units: ug/dL 212  TIBC Latest Ref Range: 250-450 ug/dL 284  Saturation Ratios Latest Ref Range: 17.9-39.5 % 25  Ferritin Latest Ref Range: 24-336 ng/mL 358 (H)  Hemoglobin Latest Ref Range: 13.0-17.0 g/dL 11.6 (L)  HCT Latest Ref Range: 39.0-52.0 % 34.1 (L)

## 2015-10-30 ENCOUNTER — Other Ambulatory Visit: Payer: Self-pay | Admitting: Urology

## 2015-10-30 DIAGNOSIS — N2889 Other specified disorders of kidney and ureter: Secondary | ICD-10-CM

## 2015-10-31 ENCOUNTER — Encounter (HOSPITAL_COMMUNITY)
Admission: RE | Admit: 2015-10-31 | Discharge: 2015-10-31 | Disposition: A | Discharge status: Home / Self Care | Payer: Medicare Other | Source: Ambulatory Visit | Attending: Nephrology | Admitting: Nephrology

## 2015-10-31 DIAGNOSIS — D638 Anemia in other chronic diseases classified elsewhere: Secondary | ICD-10-CM | POA: Diagnosis present

## 2015-10-31 DIAGNOSIS — N183 Chronic kidney disease, stage 3 (moderate): Secondary | ICD-10-CM | POA: Diagnosis present

## 2015-10-31 LAB — HEMOGLOBIN AND HEMATOCRIT, BLOOD
HEMATOCRIT: 33 % — AB (ref 39.0–52.0)
HEMOGLOBIN: 11.1 g/dL — AB (ref 13.0–17.0)

## 2015-10-31 MED ORDER — EPOETIN ALFA 10000 UNIT/ML IJ SOLN
INTRAMUSCULAR | Status: AC
  Filled 2015-10-31: qty 1

## 2015-10-31 MED ORDER — EPOETIN ALFA 10000 UNIT/ML IJ SOLN
10000.0000 [IU] | INTRAMUSCULAR | Status: DC
  Administered 2015-10-31: 10000 [IU] via SUBCUTANEOUS

## 2015-10-31 NOTE — Progress Notes (Signed)
Results for MEHDI, MEYERHOFER (MRN [id number]) as of 10/31/2015 07:30  Ref. Range 10/31/2015 07:05  Hemoglobin Latest Ref Range: 13.0-17.0 g/dL 11.1 (L)  HCT Latest Ref Range: 39.0-52.0 % 33.0 (L)

## 2015-11-11 ENCOUNTER — Ambulatory Visit (HOSPITAL_COMMUNITY)
Admission: RE | Admit: 2015-11-11 | Discharge: 2015-11-11 | Disposition: A | Discharge status: Home / Self Care | Payer: Medicare Other | Source: Ambulatory Visit | Attending: Urology | Admitting: Urology

## 2015-11-11 DIAGNOSIS — N2889 Other specified disorders of kidney and ureter: Secondary | ICD-10-CM

## 2015-11-21 ENCOUNTER — Encounter (HOSPITAL_COMMUNITY)
Admission: RE | Admit: 2015-11-21 | Discharge: 2015-11-21 | Disposition: A | Discharge status: Home / Self Care | Payer: Medicare Other | Source: Ambulatory Visit | Attending: Nephrology | Admitting: Nephrology

## 2015-11-21 DIAGNOSIS — N183 Chronic kidney disease, stage 3 (moderate): Secondary | ICD-10-CM | POA: Diagnosis not present

## 2015-11-21 LAB — HEMOGLOBIN AND HEMATOCRIT, BLOOD
HCT: 34.7 % — ABNORMAL LOW (ref 39.0–52.0)
HEMOGLOBIN: 12 g/dL — AB (ref 13.0–17.0)

## 2015-11-21 NOTE — Progress Notes (Signed)
Results for Steven Perez, Steven Perez (MRN [id number]) as of 11/21/2015 08:14  Ref. Range 11/21/2015 07:15  Hemoglobin Latest Ref Range: 13.0-17.0 g/dL 12.0 (L)  HCT Latest Ref Range: 39.0-52.0 % 34.7 (L)   Hgb > 11.5 no procrit administered. Will recheck in 2 weeks.

## 2015-11-25 ENCOUNTER — Ambulatory Visit: Payer: Medicare Other | Admitting: Urology

## 2015-12-05 ENCOUNTER — Encounter (HOSPITAL_COMMUNITY): Payer: Self-pay

## 2015-12-05 ENCOUNTER — Encounter (HOSPITAL_COMMUNITY)
Admission: RE | Admit: 2015-12-05 | Discharge: 2015-12-05 | Disposition: A | Discharge status: Home / Self Care | Payer: Medicare Other | Source: Ambulatory Visit | Attending: Nephrology | Admitting: Nephrology

## 2015-12-05 DIAGNOSIS — N183 Chronic kidney disease, stage 3 (moderate): Secondary | ICD-10-CM | POA: Insufficient documentation

## 2015-12-05 DIAGNOSIS — D638 Anemia in other chronic diseases classified elsewhere: Secondary | ICD-10-CM | POA: Insufficient documentation

## 2015-12-05 LAB — HEMOGLOBIN AND HEMATOCRIT, BLOOD
HCT: 33.2 % — ABNORMAL LOW (ref 39.0–52.0)
HEMOGLOBIN: 11.4 g/dL — AB (ref 13.0–17.0)

## 2015-12-05 LAB — IRON AND TIBC
Iron: 69 ug/dL (ref 45–182)
Saturation Ratios: 25 % (ref 17.9–39.5)
TIBC: 280 ug/dL (ref 250–450)
UIBC: 211 ug/dL

## 2015-12-05 LAB — FERRITIN: FERRITIN: 413 ng/mL — AB (ref 24–336)

## 2015-12-05 MED ORDER — EPOETIN ALFA 10000 UNIT/ML IJ SOLN
10000.0000 [IU] | INTRAMUSCULAR | Status: DC
  Administered 2015-12-05: 10000 [IU] via SUBCUTANEOUS

## 2015-12-05 MED ORDER — EPOETIN ALFA 10000 UNIT/ML IJ SOLN
INTRAMUSCULAR | Status: AC
  Filled 2015-12-05: qty 1

## 2015-12-05 NOTE — Progress Notes (Signed)
Results for [name], [name] (MRN [id number]) as of 12/05/2015 09:08 Procrit 10000 units given as indicated. Next appointment December 26, 2015    Ref. Range 12/05/2015 07:05  Hemoglobin Latest Ref Range: 13.0-17.0 g/dL 11.4 (L)  HCT Latest Ref Range: 39.0-52.0 % 33.2 (L)

## 2015-12-18 ENCOUNTER — Other Ambulatory Visit (HOSPITAL_COMMUNITY): Payer: Self-pay | Admitting: Interventional Radiology

## 2015-12-18 DIAGNOSIS — N2889 Other specified disorders of kidney and ureter: Secondary | ICD-10-CM

## 2015-12-26 ENCOUNTER — Encounter (HOSPITAL_COMMUNITY)
Admission: RE | Admit: 2015-12-26 | Discharge: 2015-12-26 | Disposition: A | Discharge status: Home / Self Care | Payer: Medicare Other | Source: Ambulatory Visit | Attending: Nephrology | Admitting: Nephrology

## 2015-12-26 DIAGNOSIS — N183 Chronic kidney disease, stage 3 (moderate): Secondary | ICD-10-CM | POA: Diagnosis not present

## 2015-12-26 LAB — HEMOGLOBIN AND HEMATOCRIT, BLOOD
HCT: 34.1 % — ABNORMAL LOW (ref 39.0–52.0)
Hemoglobin: 11.6 g/dL — ABNORMAL LOW (ref 13.0–17.0)

## 2016-01-09 ENCOUNTER — Encounter (HOSPITAL_COMMUNITY)
Admission: RE | Admit: 2016-01-09 | Discharge: 2016-01-09 | Disposition: A | Discharge status: Home / Self Care | Payer: Medicare Other | Source: Ambulatory Visit | Attending: Nephrology | Admitting: Nephrology

## 2016-01-09 ENCOUNTER — Encounter (HOSPITAL_COMMUNITY): Payer: Self-pay

## 2016-01-09 DIAGNOSIS — D638 Anemia in other chronic diseases classified elsewhere: Secondary | ICD-10-CM | POA: Diagnosis present

## 2016-01-09 DIAGNOSIS — N183 Chronic kidney disease, stage 3 (moderate): Secondary | ICD-10-CM | POA: Diagnosis not present

## 2016-01-09 LAB — RENAL FUNCTION PANEL
ALBUMIN: 4 g/dL (ref 3.5–5.0)
ANION GAP: 10 (ref 5–15)
BUN: 46 mg/dL — AB (ref 6–20)
CALCIUM: 8.3 mg/dL — AB (ref 8.9–10.3)
CO2: 26 mmol/L (ref 22–32)
CREATININE: 3.73 mg/dL — AB (ref 0.61–1.24)
Chloride: 97 mmol/L — ABNORMAL LOW (ref 101–111)
GFR calc Af Amer: 18 mL/min — ABNORMAL LOW (ref >60)
GFR calc non Af Amer: 16 mL/min — ABNORMAL LOW (ref >60)
GLUCOSE: 152 mg/dL — AB (ref 65–99)
PHOSPHORUS: 4.2 mg/dL (ref 2.5–4.6)
Potassium: 3.4 mmol/L — ABNORMAL LOW (ref 3.5–5.1)
SODIUM: 133 mmol/L — AB (ref 135–145)

## 2016-01-09 LAB — MAGNESIUM: MAGNESIUM: 1.8 mg/dL (ref 1.7–2.4)

## 2016-01-09 LAB — HEMOGLOBIN AND HEMATOCRIT, BLOOD
HEMATOCRIT: 35.6 % — AB (ref 39.0–52.0)
HEMOGLOBIN: 12.5 g/dL — AB (ref 13.0–17.0)

## 2016-01-09 LAB — IRON AND TIBC
Iron: 63 ug/dL (ref 45–182)
Saturation Ratios: 21 % (ref 17.9–39.5)
TIBC: 301 ug/dL (ref 250–450)
UIBC: 238 ug/dL

## 2016-01-09 LAB — FERRITIN: Ferritin: 437 ng/mL — ABNORMAL HIGH (ref 24–336)

## 2016-01-09 MED ORDER — EPOETIN ALFA 20000 UNIT/ML IJ SOLN: 10000.0000 [IU] | Freq: Once | INTRAMUSCULAR | Status: DC

## 2016-01-09 NOTE — Progress Notes (Signed)
Results for CAISON, HEARN (MRN [id number]) as of 01/09/2016 16:19  Ref. Range 01/09/2016 06:56  Sodium Latest Ref Range: 135 - 145 mmol/L 133 (L)  Potassium Latest Ref Range: 3.5 - 5.1 mmol/L 3.4 (L)  Chloride Latest Ref Range: 101 - 111 mmol/L 97 (L)  CO2 Latest Ref Range: 22 - 32 mmol/L 26  BUN Latest Ref Range: 6 - 20 mg/dL 46 (H)  Creatinine Latest Ref Range: 0.61 - 1.24 mg/dL 3.73 (H)  Calcium Latest Ref Range: 8.9 - 10.3 mg/dL 8.3 (L)  EGFR (Non-African Amer.) Latest Ref Range: >60 mL/min 16 (L)  EGFR (African American) Latest Ref Range: >60 mL/min 18 (L)  Glucose Latest Ref Range: 65 - 99 mg/dL 152 (H)  Anion gap Latest Ref Range: 5 - 15  10  Phosphorus Latest Ref Range: 2.5 - 4.6 mg/dL 4.2  Magnesium Latest Ref Range: 1.7 - 2.4 mg/dL 1.8  Albumin Latest Ref Range: 3.5 - 5.0 g/dL 4.0  Hemoglobin Latest Ref Range: 13.0 - 17.0 g/dL 12.5 (L)  HCT Latest Ref Range: 39.0 - 52.0 % 35.6 (L)

## 2016-01-15 ENCOUNTER — Ambulatory Visit
Admission: RE | Admit: 2016-01-15 | Discharge: 2016-01-15 | Disposition: A | Discharge status: Home / Self Care | Payer: Medicare Other | Source: Ambulatory Visit | Attending: Interventional Radiology | Admitting: Interventional Radiology

## 2016-01-15 ENCOUNTER — Ambulatory Visit (HOSPITAL_COMMUNITY)
Admission: RE | Admit: 2016-01-15 | Discharge: 2016-01-15 | Disposition: A | Discharge status: Home / Self Care | Payer: Medicare Other | Source: Ambulatory Visit | Attending: Interventional Radiology | Admitting: Interventional Radiology

## 2016-01-15 DIAGNOSIS — N281 Cyst of kidney, acquired: Secondary | ICD-10-CM | POA: Diagnosis not present

## 2016-01-15 DIAGNOSIS — N2889 Other specified disorders of kidney and ureter: Secondary | ICD-10-CM

## 2016-01-15 HISTORY — PX: IR GENERIC HISTORICAL: IMG1180011

## 2016-01-15 NOTE — Progress Notes (Signed)
Patient ID: Steven Perez, male   DOB: 1950-09-17, 65 y.o.   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  This lesion has been followed for the last 2 years.  He has had this aspirated with benign results.  He denies any change in his medical history in the last year.  He denies back/flank pain or hematuria.  He presents today after his MRI scan for a follow up visit.  Past Medical History:  Diagnosis Date  . Acute on chronic renal insufficiency (HCC)   . Anemia    assoc w/ chronic reanl failure  . Arthralgia   . Chronic kidney disease    Stage 4  . Diabetes mellitus   . Dyslipidemia   . Erectile dysfunction due to arterial insufficiency   . HLD (hyperlipidemia)   . Hypertension   . Insomnia   . Left renal mass   . Obesity   . Secondary hyperparathyroidism of renal origin [facility])     Past Surgical History:  Procedure Laterality Date  . RENAL BIOPSY    . RENAL BIOPSY, PERCUTANEOUS    . TONSILLECTOMY      Allergies: Contrast media [iodinated diagnostic agents] and Tetanus toxoids  Medications: Prior to Admission medications   Medication Sig Start Date End Date Taking? Authorizing Provider  acetaminophen (TYLENOL) 500 MG tablet Take 500 mg by mouth at bedtime as needed for mild pain. Pain   Yes Historical Provider, MD  calcitRIOL (ROCALTROL) 0.25 MCG capsule Take 0.25 mcg by mouth daily.   Yes Historical Provider, MD  carvedilol (COREG) 6.25 MG tablet Take 18.75 mg by mouth 2 (two) times daily with a meal. Tablet dose 12.5 mg. Take 1 and 1/2 tablets twice a day. For total dose of 18.75 mg po twice a day.   Yes Historical Provider, MD  doxazosin (CARDURA) 2 MG tablet Take 4 mg by mouth daily.    Yes Historical Provider, MD  furosemide  (LASIX) 40 MG tablet Take 40 mg by mouth 2 (two) times daily.    Yes Historical Provider, MD  Insulin Degludec (TRESIBA FLEXTOUCH) 200 UNIT/ML SOPN Inject 20 Units into the skin at bedtime.   Yes Historical Provider, MD  simvastatin (ZOCOR) 40 MG tablet Take 40 mg by mouth daily.   Yes Historical Provider, MD  traZODone (DESYREL) 100 MG tablet Take 100 mg by mouth at bedtime.   Yes Historical Provider, MD  verapamil (CALAN-SR) 180 MG CR tablet Take 2 tablets (360 mg total) by mouth at bedtime. 12/04/13  Yes Janece Canterbury, MD  Vitamin D, Ergocalciferol, (DRISDOL) 50000 UNITS CAPS capsule Take 50,000 Units by mouth every 7 (seven) days.   Yes Historical Provider, MD  tadalafil (CIALIS) 10 MG tablet Take 10 mg by mouth daily as needed. Sexual Arousal    Historical Provider, MD     Family History  Problem Relation Age of Onset  . Diabetes Mellitus II Mother   . CAD Neg Hx   . Stroke Neg Hx     Social History   Social History  . Marital status: Married    Spouse name: N/A  . Number of children: N/A  . Years of education: N/A   Social History Main Topics  . Smoking status:  Former Smoker  . Smokeless tobacco: Never Used  . Alcohol use No     Comment: occasionally  . Drug use: No  . Sexual activity: Yes   Other Topics Concern  . None   Social History Narrative  . None     Vital Signs: BP (!) 169/84 (BP Location: Right Arm, Patient Position: Sitting, Cuff Size: Normal)   Pulse 60   Temp 97.7 F (36.5 C)   Resp 20   Ht 5\' 8"  (1.727 m)   Wt 240 lb (108.9 kg)   SpO2 98%   BMI 36.49 kg/m   Physical Exam Gen: pleasant, obese black male in NAD Heart: regular Lungs: CTAB Abd: obese, soft, NT, +BS Imaging: No results found.  Labs:  CBC:  Recent Labs  11/21/15 0715 12/05/15 0705 12/26/15 0708 01/09/16 0656  HGB 12.0* 11.4* 11.6* 12.5*  HCT 34.7* 33.2* 34.1* 35.6*    COAGS: No results for input(s): INR, APTT in the last 8760 hours.  BMP:  Recent Labs   01/09/16 0656  NA 133*  K 3.4*  CL 97*  CO2 26  GLUCOSE 152*  BUN 46*  CALCIUM 8.3*  CREATININE 3.73*  GFRNONAA 16*  GFRAA 18*    LIVER FUNCTION TESTS:  Recent Labs  01/09/16 0656  ALBUMIN 4.0    Assessment:  Left renal cystic mass  The patient is doing well with no changes in his medical condition since we last saw him.  He had his MRI scan done today.  The official read is still pending, but preliminary view appears to show an essentially stable lesion.  No further work up will likely need to be done at this.  He will likely still need one more year of surveillance with repeat MRI of the Abd without contrast in one year with a follow up visit.  Signed: Henreitta Cea 01/15/2016, 2:34 PM   Please refer to Dr. Margaretmary Dys attestation of this note for management and plan.

## 2016-01-23 ENCOUNTER — Encounter (HOSPITAL_COMMUNITY)
Admission: RE | Admit: 2016-01-23 | Discharge: 2016-01-23 | Disposition: A | Discharge status: Home / Self Care | Payer: Medicare Other | Source: Ambulatory Visit | Attending: Nephrology | Admitting: Nephrology

## 2016-01-23 DIAGNOSIS — N183 Chronic kidney disease, stage 3 (moderate): Secondary | ICD-10-CM | POA: Diagnosis not present

## 2016-01-23 LAB — HEMOGLOBIN AND HEMATOCRIT, BLOOD
HEMATOCRIT: 34.7 % — AB (ref 39.0–52.0)
HEMOGLOBIN: 11.7 g/dL — AB (ref 13.0–17.0)

## 2016-01-23 NOTE — Progress Notes (Signed)
Results for SACHA, BESERRA (MRN [id number]) as of 01/23/2016 07:35  Ref. Range 01/23/2016 07:08  Hemoglobin Latest Ref Range: 13.0 - 17.0 g/dL 11.7 (L)  HCT Latest Ref Range: 39.0 - 52.0 % 34.7 (L)  Procrit not indicated per MD order.

## 2016-02-20 ENCOUNTER — Encounter (HOSPITAL_COMMUNITY)
Admission: RE | Admit: 2016-02-20 | Discharge: 2016-02-20 | Disposition: A | Discharge status: Home / Self Care | Payer: Medicare Other | Source: Ambulatory Visit | Attending: Nephrology | Admitting: Nephrology

## 2016-02-20 DIAGNOSIS — N183 Chronic kidney disease, stage 3 (moderate): Secondary | ICD-10-CM | POA: Insufficient documentation

## 2016-02-20 DIAGNOSIS — D638 Anemia in other chronic diseases classified elsewhere: Secondary | ICD-10-CM | POA: Insufficient documentation

## 2016-02-20 LAB — RENAL FUNCTION PANEL
ALBUMIN: 4 g/dL (ref 3.5–5.0)
Anion gap: 9 (ref 5–15)
BUN: 50 mg/dL — AB (ref 6–20)
CALCIUM: 8.6 mg/dL — AB (ref 8.9–10.3)
CO2: 26 mmol/L (ref 22–32)
Chloride: 101 mmol/L (ref 101–111)
Creatinine, Ser: 3.81 mg/dL — ABNORMAL HIGH (ref 0.61–1.24)
GFR calc Af Amer: 18 mL/min — ABNORMAL LOW (ref >60)
GFR calc non Af Amer: 15 mL/min — ABNORMAL LOW (ref >60)
GLUCOSE: 198 mg/dL — AB (ref 65–99)
PHOSPHORUS: 3.8 mg/dL (ref 2.5–4.6)
Potassium: 3.5 mmol/L (ref 3.5–5.1)
SODIUM: 136 mmol/L (ref 135–145)

## 2016-02-20 LAB — HEMOGLOBIN AND HEMATOCRIT, BLOOD
HEMATOCRIT: 33 % — AB (ref 39.0–52.0)
HEMOGLOBIN: 11.2 g/dL — AB (ref 13.0–17.0)

## 2016-02-20 LAB — IRON AND TIBC
Iron: 73 ug/dL (ref 45–182)
Saturation Ratios: 26 % (ref 17.9–39.5)
TIBC: 284 ug/dL (ref 250–450)
UIBC: 211 ug/dL

## 2016-02-20 LAB — FERRITIN: Ferritin: 372 ng/mL — ABNORMAL HIGH (ref 24–336)

## 2016-02-20 MED ORDER — EPOETIN ALFA 10000 UNIT/ML IJ SOLN
INTRAMUSCULAR | Status: AC
  Filled 2016-02-20: qty 1

## 2016-02-20 MED ORDER — EPOETIN ALFA 10000 UNIT/ML IJ SOLN
10000.0000 [IU] | Freq: Once | INTRAMUSCULAR | Status: AC
  Administered 2016-02-20: 10000 [IU] via SUBCUTANEOUS

## 2016-02-20 NOTE — Progress Notes (Signed)
Results for WYETH, HOFFER (MRN [id number]) as of 02/20/2016 15:20  Ref. Range 02/20/2016 06:59  Sodium Latest Ref Range: 135 - 145 mmol/L 136  Potassium Latest Ref Range: 3.5 - 5.1 mmol/L 3.5  Chloride Latest Ref Range: 101 - 111 mmol/L 101  CO2 Latest Ref Range: 22 - 32 mmol/L 26  BUN Latest Ref Range: 6 - 20 mg/dL 50 (H)  Creatinine Latest Ref Range: 0.61 - 1.24 mg/dL 3.81 (H)  Calcium Latest Ref Range: 8.9 - 10.3 mg/dL 8.6 (L)  EGFR (Non-African Amer.) Latest Ref Range: >60 mL/min 15 (L)  EGFR (African American) Latest Ref Range: >60 mL/min 18 (L)  Glucose Latest Ref Range: 65 - 99 mg/dL 198 (H)  Anion gap Latest Ref Range: 5 - 15  9  Phosphorus Latest Ref Range: 2.5 - 4.6 mg/dL 3.8  Albumin Latest Ref Range: 3.5 - 5.0 g/dL 4.0  Iron Latest Ref Range: 45 - 182 ug/dL 73  UIBC Latest Units: ug/dL 211  TIBC Latest Ref Range: 250 - 450 ug/dL 284  Saturation Ratios Latest Ref Range: 17.9 - 39.5 % 26  Ferritin Latest Ref Range: 24 - 336 ng/mL 372 (H)  Hemoglobin Latest Ref Range: 13.0 - 17.0 g/dL 11.2 (L)  HCT Latest Ref Range: 39.0 - 52.0 % 33.0 (L)

## 2016-02-21 LAB — PTH, INTACT AND CALCIUM
CALCIUM TOTAL (PTH): 8.6 mg/dL (ref 8.6–10.2)
PTH: 125 pg/mL — AB (ref 15–65)

## 2016-03-19 ENCOUNTER — Encounter (HOSPITAL_COMMUNITY): Payer: Self-pay

## 2016-03-19 ENCOUNTER — Encounter (HOSPITAL_COMMUNITY)
Admission: RE | Admit: 2016-03-19 | Discharge: 2016-03-19 | Disposition: A | Discharge status: Home / Self Care | Payer: Medicare Other | Source: Ambulatory Visit | Attending: Nephrology | Admitting: Nephrology

## 2016-03-19 DIAGNOSIS — D538 Other specified nutritional anemias: Secondary | ICD-10-CM | POA: Diagnosis not present

## 2016-03-19 DIAGNOSIS — N183 Chronic kidney disease, stage 3 (moderate): Secondary | ICD-10-CM | POA: Insufficient documentation

## 2016-03-19 LAB — RENAL FUNCTION PANEL
ALBUMIN: 3.9 g/dL (ref 3.5–5.0)
Anion gap: 10 (ref 5–15)
BUN: 56 mg/dL — AB (ref 6–20)
CALCIUM: 8.6 mg/dL — AB (ref 8.9–10.3)
CO2: 25 mmol/L (ref 22–32)
Chloride: 100 mmol/L — ABNORMAL LOW (ref 101–111)
Creatinine, Ser: 4.09 mg/dL — ABNORMAL HIGH (ref 0.61–1.24)
GFR calc Af Amer: 16 mL/min — ABNORMAL LOW (ref >60)
GFR calc non Af Amer: 14 mL/min — ABNORMAL LOW (ref >60)
GLUCOSE: 155 mg/dL — AB (ref 65–99)
PHOSPHORUS: 4.5 mg/dL (ref 2.5–4.6)
Potassium: 3.5 mmol/L (ref 3.5–5.1)
SODIUM: 135 mmol/L (ref 135–145)

## 2016-03-19 LAB — IRON AND TIBC
Iron: 69 ug/dL (ref 45–182)
SATURATION RATIOS: 24 % (ref 17.9–39.5)
TIBC: 286 ug/dL (ref 250–450)
UIBC: 217 ug/dL

## 2016-03-19 LAB — FERRITIN: Ferritin: 366 ng/mL — ABNORMAL HIGH (ref 24–336)

## 2016-03-19 LAB — POCT HEMOGLOBIN-HEMACUE: HEMOGLOBIN: 10.7 g/dL — AB (ref 13.0–17.0)

## 2016-03-19 MED ORDER — EPOETIN ALFA 10000 UNIT/ML IJ SOLN
INTRAMUSCULAR | Status: AC
  Filled 2016-03-19: qty 1

## 2016-03-19 MED ORDER — EPOETIN ALFA 10000 UNIT/ML IJ SOLN
10000.0000 [IU] | Freq: Once | INTRAMUSCULAR | Status: AC
  Administered 2016-03-19: 10000 [IU] via SUBCUTANEOUS

## 2016-03-20 LAB — PTH, INTACT AND CALCIUM
Calcium, Total (PTH): 8.7 mg/dL (ref 8.6–10.2)
PTH: 85 pg/mL — ABNORMAL HIGH (ref 15–65)

## 2016-03-22 NOTE — Progress Notes (Signed)
Results for CORDELLE, DAHMEN (MRN [id number]) as of 03/22/2016 10:21  Ref. Range 03/19/2016 07:07 03/19/2016 07:07 03/19/2016 07:22  Sodium Latest Ref Range: 135 - 145 mmol/L 135    Potassium Latest Ref Range: 3.5 - 5.1 mmol/L 3.5    Chloride Latest Ref Range: 101 - 111 mmol/L 100 (L)    CO2 Latest Ref Range: 22 - 32 mmol/L 25    BUN Latest Ref Range: 6 - 20 mg/dL 56 (H)    Creatinine Latest Ref Range: 0.61 - 1.24 mg/dL 4.09 (H)    Calcium Latest Ref Range: 8.9 - 10.3 mg/dL 8.6 (L)    EGFR (Non-African Amer.) Latest Ref Range: >60 mL/min 14 (L)    EGFR (African American) Latest Ref Range: >60 mL/min 16 (L)    Glucose Latest Ref Range: 65 - 99 mg/dL 155 (H)    Anion gap Latest Ref Range: 5 - 15  10    Calcium, Total (PTH) Latest Ref Range: 8.6 - 10.2 mg/dL 8.7    Phosphorus Latest Ref Range: 2.5 - 4.6 mg/dL 4.5    Albumin Latest Ref Range: 3.5 - 5.0 g/dL 3.9    Iron Latest Ref Range: 45 - 182 ug/dL 69    UIBC Latest Units: ug/dL 217    TIBC Latest Ref Range: 250 - 450 ug/dL 286    Saturation Ratios Latest Ref Range: 17.9 - 39.5 % 24    Ferritin Latest Ref Range: 24 - 336 ng/mL 366 (H)    Hemoglobin Latest Ref Range: 13.0 - 17.0 g/dL   10.7 (L)  PTH Unknown 85 (H) Comment

## 2016-03-24 ENCOUNTER — Encounter (HOSPITAL_COMMUNITY)
Admission: RE | Admit: 2016-03-24 | Discharge: 2016-03-24 | Disposition: A | Discharge status: Home / Self Care | Payer: Medicare Other | Source: Ambulatory Visit | Attending: Nephrology | Admitting: Nephrology

## 2016-03-24 DIAGNOSIS — N183 Chronic kidney disease, stage 3 (moderate): Secondary | ICD-10-CM | POA: Diagnosis not present

## 2016-03-24 MED ORDER — SODIUM CHLORIDE 0.9 % IV SOLN
510.0000 mg | Freq: Once | INTRAVENOUS | Status: AC
  Administered 2016-03-24: 510 mg via INTRAVENOUS
  Filled 2016-03-24: qty 17

## 2016-03-24 MED ORDER — SODIUM CHLORIDE 0.9 % IV SOLN
INTRAVENOUS | Status: DC
  Administered 2016-03-24: 250 mL via INTRAVENOUS

## 2016-03-31 ENCOUNTER — Encounter (HOSPITAL_COMMUNITY)
Admission: RE | Admit: 2016-03-31 | Discharge: 2016-03-31 | Disposition: A | Discharge status: Home / Self Care | Payer: Medicare Other | Source: Ambulatory Visit | Attending: Nephrology | Admitting: Nephrology

## 2016-03-31 DIAGNOSIS — D509 Iron deficiency anemia, unspecified: Secondary | ICD-10-CM | POA: Diagnosis present

## 2016-03-31 MED ORDER — SODIUM CHLORIDE 0.9 % IV SOLN
INTRAVENOUS | Status: DC
  Administered 2016-03-31: 250 mL via INTRAVENOUS

## 2016-03-31 MED ORDER — SODIUM CHLORIDE 0.9 % IV SOLN
510.0000 mg | Freq: Once | INTRAVENOUS | Status: AC
  Administered 2016-03-31: 510 mg via INTRAVENOUS
  Filled 2016-03-31: qty 17

## 2016-04-16 ENCOUNTER — Encounter (HOSPITAL_COMMUNITY)
Admission: RE | Admit: 2016-04-16 | Discharge: 2016-04-16 | Disposition: A | Discharge status: Home / Self Care | Payer: Medicare Other | Source: Ambulatory Visit | Attending: Nephrology | Admitting: Nephrology

## 2016-04-16 ENCOUNTER — Encounter (HOSPITAL_COMMUNITY): Payer: Self-pay

## 2016-04-16 DIAGNOSIS — D509 Iron deficiency anemia, unspecified: Secondary | ICD-10-CM | POA: Diagnosis not present

## 2016-04-16 LAB — RENAL FUNCTION PANEL
ALBUMIN: 4 g/dL (ref 3.5–5.0)
ANION GAP: 10 (ref 5–15)
BUN: 44 mg/dL — ABNORMAL HIGH (ref 6–20)
CO2: 25 mmol/L (ref 22–32)
Calcium: 8.7 mg/dL — ABNORMAL LOW (ref 8.9–10.3)
Chloride: 101 mmol/L (ref 101–111)
Creatinine, Ser: 3.97 mg/dL — ABNORMAL HIGH (ref 0.61–1.24)
GFR calc Af Amer: 17 mL/min — ABNORMAL LOW (ref >60)
GFR calc non Af Amer: 15 mL/min — ABNORMAL LOW (ref >60)
GLUCOSE: 123 mg/dL — AB (ref 65–99)
PHOSPHORUS: 4 mg/dL (ref 2.5–4.6)
POTASSIUM: 3.1 mmol/L — AB (ref 3.5–5.1)
Sodium: 136 mmol/L (ref 135–145)

## 2016-04-16 LAB — HEMOGLOBIN AND HEMATOCRIT, BLOOD
HCT: 34.7 % — ABNORMAL LOW (ref 39.0–52.0)
Hemoglobin: 11.9 g/dL — ABNORMAL LOW (ref 13.0–17.0)

## 2016-04-16 LAB — POCT HEMOGLOBIN-HEMACUE: HEMOGLOBIN: 11.6 g/dL — AB (ref 13.0–17.0)

## 2016-04-16 MED ORDER — EPOETIN ALFA 10000 UNIT/ML IJ SOLN: 10000.0000 [IU] | INTRAMUSCULAR | Status: DC

## 2016-04-16 NOTE — Progress Notes (Signed)
Results for KHALEB, BROZ (MRN [id number]) as of 04/16/2016 07:26  Ref. Range 04/16/2016 07:18  Hemoglobin Latest Ref Range: 13.0 - 17.0 g/dL 11.6 (L)

## 2016-04-17 LAB — PTH, INTACT AND CALCIUM
CALCIUM TOTAL (PTH): 8.7 mg/dL (ref 8.6–10.2)
PTH: 55 pg/mL (ref 15–65)

## 2016-04-30 ENCOUNTER — Encounter (HOSPITAL_COMMUNITY)
Admission: RE | Admit: 2016-04-30 | Discharge: 2016-04-30 | Disposition: A | Discharge status: Home / Self Care | Payer: Medicare Other | Source: Ambulatory Visit | Attending: Nephrology | Admitting: Nephrology

## 2016-04-30 DIAGNOSIS — N183 Chronic kidney disease, stage 3 (moderate): Secondary | ICD-10-CM | POA: Diagnosis not present

## 2016-04-30 DIAGNOSIS — D638 Anemia in other chronic diseases classified elsewhere: Secondary | ICD-10-CM | POA: Diagnosis present

## 2016-04-30 LAB — POCT HEMOGLOBIN-HEMACUE: Hemoglobin: 11.8 g/dL — ABNORMAL LOW (ref 13.0–17.0)

## 2016-04-30 NOTE — Progress Notes (Signed)
Results for AHAMED, HOFLAND (MRN [id number]) as of 04/30/2016 07:37  Ref. Range 04/30/2016 07:22  Hemoglobin Latest Ref Range: 13.0 - 17.0 g/dL 11.8 (L)

## 2016-06-02 ENCOUNTER — Encounter: Payer: Self-pay | Admitting: Interventional Radiology

## 2016-06-03 ENCOUNTER — Encounter: Payer: Self-pay | Admitting: Interventional Radiology

## 2016-06-04 ENCOUNTER — Encounter (HOSPITAL_COMMUNITY)
Admission: RE | Admit: 2016-06-04 | Discharge: 2016-06-04 | Disposition: A | Discharge status: Home / Self Care | Payer: Medicare Other | Source: Ambulatory Visit | Attending: Nephrology | Admitting: Nephrology

## 2016-06-04 ENCOUNTER — Encounter (HOSPITAL_COMMUNITY): Payer: Self-pay

## 2016-06-04 DIAGNOSIS — D638 Anemia in other chronic diseases classified elsewhere: Secondary | ICD-10-CM | POA: Insufficient documentation

## 2016-06-04 DIAGNOSIS — N183 Chronic kidney disease, stage 3 (moderate): Secondary | ICD-10-CM | POA: Insufficient documentation

## 2016-06-04 LAB — RENAL FUNCTION PANEL
ALBUMIN: 4.1 g/dL (ref 3.5–5.0)
ANION GAP: 9 (ref 5–15)
BUN: 45 mg/dL — ABNORMAL HIGH (ref 6–20)
CALCIUM: 8.7 mg/dL — AB (ref 8.9–10.3)
CO2: 29 mmol/L (ref 22–32)
Chloride: 100 mmol/L — ABNORMAL LOW (ref 101–111)
Creatinine, Ser: 3.94 mg/dL — ABNORMAL HIGH (ref 0.61–1.24)
GFR, EST AFRICAN AMERICAN: 17 mL/min — AB (ref >60)
GFR, EST NON AFRICAN AMERICAN: 15 mL/min — AB (ref >60)
Glucose, Bld: 140 mg/dL — ABNORMAL HIGH (ref 65–99)
PHOSPHORUS: 4.5 mg/dL (ref 2.5–4.6)
Potassium: 3.6 mmol/L (ref 3.5–5.1)
SODIUM: 138 mmol/L (ref 135–145)

## 2016-06-04 LAB — POCT HEMOGLOBIN-HEMACUE: Hemoglobin: 11.8 g/dL — ABNORMAL LOW (ref 13.0–17.0)

## 2016-06-04 LAB — IRON AND TIBC
Iron: 65 ug/dL (ref 45–182)
Saturation Ratios: 22 % (ref 17.9–39.5)
TIBC: 293 ug/dL (ref 250–450)
UIBC: 228 ug/dL

## 2016-06-04 LAB — FERRITIN: FERRITIN: 710 ng/mL — AB (ref 24–336)

## 2016-06-04 MED ORDER — EPOETIN ALFA 10000 UNIT/ML IJ SOLN
INTRAMUSCULAR | Status: AC
  Filled 2016-06-04: qty 1

## 2016-06-04 MED ORDER — EPOETIN ALFA 10000 UNIT/ML IJ SOLN
10000.0000 [IU] | INTRAMUSCULAR | Status: DC
  Administered 2016-06-04: 10000 [IU] via SUBCUTANEOUS

## 2016-06-05 LAB — PTH, INTACT AND CALCIUM
CALCIUM TOTAL (PTH): 9 mg/dL (ref 8.6–10.2)
PTH: 85 pg/mL — AB (ref 15–65)

## 2016-06-07 NOTE — Progress Notes (Signed)
Talked with Dr Ena Dawley nurse. Explained the order in question about the Hgb level. She stated that it was an oversight and that it should have been exactly as before. Informed her that procrit 10,000 units given. Stated okay. Stated that she would send a new order. New order received and Hgb is 11.5 for the procrit to be given. I called Steven Perez and explained everything and he said thank you for checking and he appreciated me for calling him.

## 2016-06-07 NOTE — Progress Notes (Signed)
New orders from 06/03/16 have Hbg > or equal to 12, we are to give the procrit. Pt questioned this value because it has always been 11.5. Informed pt he could wait for the office to open and I could verify the order or he could take the shot. He elected to take the shot. Procrit 10,000 units given right arm.

## 2016-06-07 NOTE — Progress Notes (Signed)
Results for Steven Perez, Steven Perez (MRN [id number]) as of 06/07/2016 15:25  Ref. Range 06/04/2016 07:33 06/04/2016 07:33 06/04/2016 07:42  Sodium Latest Ref Range: 135 - 145 mmol/L 138    Potassium Latest Ref Range: 3.5 - 5.1 mmol/L 3.6    Chloride Latest Ref Range: 101 - 111 mmol/L 100 (L)    CO2 Latest Ref Range: 22 - 32 mmol/L 29    Glucose Latest Ref Range: 65 - 99 mg/dL 140 (H)    BUN Latest Ref Range: 6 - 20 mg/dL 45 (H)    Creatinine Latest Ref Range: 0.61 - 1.24 mg/dL 3.94 (H)    Calcium Latest Ref Range: 8.9 - 10.3 mg/dL 8.7 (L)    Anion gap Latest Ref Range: 5 - 15  9    Phosphorus Latest Ref Range: 2.5 - 4.6 mg/dL 4.5    Albumin Latest Ref Range: 3.5 - 5.0 g/dL 4.1    EGFR (African American) Latest Ref Range: >60 mL/min 17 (L)    EGFR (Non-African Amer.) Latest Ref Range: >60 mL/min 15 (L)    Iron Latest Ref Range: 45 - 182 ug/dL 65    UIBC Latest Units: ug/dL 228    TIBC Latest Ref Range: 250 - 450 ug/dL 293    Saturation Ratios Latest Ref Range: 17.9 - 39.5 % 22    Ferritin Latest Ref Range: 24 - 336 ng/mL 710 (H)    Hemoglobin Latest Ref Range: 13.0 - 17.0 g/dL   11.8 (L)  PTH Unknown 85 (H) Comment   Calcium, Total (PTH) Latest Ref Range: 8.6 - 10.2 mg/dL 9.0

## 2016-07-02 ENCOUNTER — Encounter (HOSPITAL_COMMUNITY)
Admission: RE | Admit: 2016-07-02 | Discharge: 2016-07-02 | Disposition: A | Discharge status: Home / Self Care | Payer: Medicare Other | Source: Ambulatory Visit | Attending: Nephrology | Admitting: Nephrology

## 2016-07-02 DIAGNOSIS — N183 Chronic kidney disease, stage 3 (moderate): Secondary | ICD-10-CM | POA: Diagnosis present

## 2016-07-02 DIAGNOSIS — D638 Anemia in other chronic diseases classified elsewhere: Secondary | ICD-10-CM | POA: Insufficient documentation

## 2016-07-02 LAB — RENAL FUNCTION PANEL
ALBUMIN: 3.9 g/dL (ref 3.5–5.0)
Anion gap: 10 (ref 5–15)
BUN: 45 mg/dL — AB (ref 6–20)
CALCIUM: 8.8 mg/dL — AB (ref 8.9–10.3)
CO2: 26 mmol/L (ref 22–32)
CREATININE: 4.02 mg/dL — AB (ref 0.61–1.24)
Chloride: 99 mmol/L — ABNORMAL LOW (ref 101–111)
GFR calc Af Amer: 17 mL/min — ABNORMAL LOW (ref >60)
GFR, EST NON AFRICAN AMERICAN: 14 mL/min — AB (ref >60)
GLUCOSE: 108 mg/dL — AB (ref 65–99)
PHOSPHORUS: 4.6 mg/dL (ref 2.5–4.6)
Potassium: 3.6 mmol/L (ref 3.5–5.1)
SODIUM: 135 mmol/L (ref 135–145)

## 2016-07-02 LAB — IRON AND TIBC
Iron: 85 ug/dL (ref 45–182)
SATURATION RATIOS: 31 % (ref 17.9–39.5)
TIBC: 272 ug/dL (ref 250–450)
UIBC: 187 ug/dL

## 2016-07-02 LAB — FERRITIN: Ferritin: 575 ng/mL — ABNORMAL HIGH (ref 24–336)

## 2016-07-02 LAB — POCT HEMOGLOBIN-HEMACUE: Hemoglobin: 12.3 g/dL — ABNORMAL LOW (ref 13.0–17.0)

## 2016-07-03 LAB — PTH, INTACT AND CALCIUM
CALCIUM TOTAL (PTH): 9 mg/dL (ref 8.6–10.2)
PTH: 121 pg/mL — ABNORMAL HIGH (ref 15–65)

## 2016-07-05 NOTE — Progress Notes (Signed)
Results for [name], [name] (MRN [id number]) as of 07/05/2016 09:05  Ref. Range 07/02/2016 07:05 07/02/2016 07:06  Sodium Latest Ref Range: 135 - 145 mmol/L  135  Potassium Latest Ref Range: 3.5 - 5.1 mmol/L  3.6  Chloride Latest Ref Range: 101 - 111 mmol/L  99 (L)  CO2 Latest Ref Range: 22 - 32 mmol/L  26  Glucose Latest Ref Range: 65 - 99 mg/dL  108 (H)  BUN Latest Ref Range: 6 - 20 mg/dL  45 (H)  Creatinine Latest Ref Range: 0.61 - 1.24 mg/dL  4.02 (H)  Calcium Latest Ref Range: 8.9 - 10.3 mg/dL  8.8 (L)  Anion gap Latest Ref Range: 5 - 15   10  Phosphorus Latest Ref Range: 2.5 - 4.6 mg/dL  4.6  Albumin Latest Ref Range: 3.5 - 5.0 g/dL  3.9  EGFR (African American) Latest Ref Range: >60 mL/min  17 (L)  EGFR (Non-African Amer.) Latest Ref Range: >60 mL/min  14 (L)  Iron Latest Ref Range: 45 - 182 ug/dL  85  UIBC Latest Units: ug/dL  187  TIBC Latest Ref Range: 250 - 450 ug/dL  272  Saturation Ratios Latest Ref Range: 17.9 - 39.5 %  31  Ferritin Latest Ref Range: 24 - 336 ng/mL  575 (H)  Hemoglobin Latest Ref Range: 13.0 - 17.0 g/dL 12.3 (L)   PTH Latest Ref Range: 15 - 65 pg/mL  121 (H)  Calcium, Total (PTH) Latest Ref Range: 8.6 - 10.2 mg/dL  9.0

## 2016-07-30 ENCOUNTER — Encounter (HOSPITAL_COMMUNITY)
Admission: RE | Admit: 2016-07-30 | Discharge: 2016-07-30 | Disposition: A | Discharge status: Home / Self Care | Payer: Medicare Other | Source: Ambulatory Visit | Attending: Nephrology | Admitting: Nephrology

## 2016-07-30 ENCOUNTER — Encounter (HOSPITAL_COMMUNITY): Payer: Self-pay

## 2016-07-30 DIAGNOSIS — N183 Chronic kidney disease, stage 3 (moderate): Secondary | ICD-10-CM | POA: Insufficient documentation

## 2016-07-30 DIAGNOSIS — D631 Anemia in chronic kidney disease: Secondary | ICD-10-CM | POA: Insufficient documentation

## 2016-07-30 LAB — RENAL FUNCTION PANEL
Albumin: 4.1 g/dL (ref 3.5–5.0)
Anion gap: 10 (ref 5–15)
BUN: 48 mg/dL — AB (ref 6–20)
CALCIUM: 8.9 mg/dL (ref 8.9–10.3)
CO2: 27 mmol/L (ref 22–32)
CREATININE: 3.93 mg/dL — AB (ref 0.61–1.24)
Chloride: 99 mmol/L — ABNORMAL LOW (ref 101–111)
GFR calc Af Amer: 17 mL/min — ABNORMAL LOW (ref >60)
GFR calc non Af Amer: 15 mL/min — ABNORMAL LOW (ref >60)
GLUCOSE: 129 mg/dL — AB (ref 65–99)
Phosphorus: 4.1 mg/dL (ref 2.5–4.6)
Potassium: 3.8 mmol/L (ref 3.5–5.1)
SODIUM: 136 mmol/L (ref 135–145)

## 2016-07-30 LAB — POCT HEMOGLOBIN-HEMACUE: Hemoglobin: 11.4 g/dL — ABNORMAL LOW (ref 13.0–17.0)

## 2016-07-30 LAB — IRON AND TIBC
Iron: 68 ug/dL (ref 45–182)
Saturation Ratios: 23 % (ref 17.9–39.5)
TIBC: 291 ug/dL (ref 250–450)
UIBC: 223 ug/dL

## 2016-07-30 LAB — FERRITIN: Ferritin: 596 ng/mL — ABNORMAL HIGH (ref 24–336)

## 2016-07-30 MED ORDER — EPOETIN ALFA 10000 UNIT/ML IJ SOLN
INTRAMUSCULAR | Status: AC
  Filled 2016-07-30: qty 1

## 2016-07-30 MED ORDER — EPOETIN ALFA 10000 UNIT/ML IJ SOLN
10000.0000 [IU] | INTRAMUSCULAR | Status: DC
  Administered 2016-07-30: 10000 [IU] via SUBCUTANEOUS

## 2016-07-31 LAB — PTH, INTACT AND CALCIUM
Calcium, Total (PTH): 9.2 mg/dL (ref 8.6–10.2)
PTH: 84 pg/mL — ABNORMAL HIGH (ref 15–65)

## 2016-08-02 NOTE — Progress Notes (Signed)
Results for Steven Perez, Steven Perez (MRN [id number]) as of 08/02/2016 07:39  Ref. Range 07/30/2016 07:01 07/30/2016 07:01 07/30/2016 07:14  Sodium Latest Ref Range: 135 - 145 mmol/L 136    Potassium Latest Ref Range: 3.5 - 5.1 mmol/L 3.8    Chloride Latest Ref Range: 101 - 111 mmol/L 99 (L)    CO2 Latest Ref Range: 22 - 32 mmol/L 27    Glucose Latest Ref Range: 65 - 99 mg/dL 129 (H)    BUN Latest Ref Range: 6 - 20 mg/dL 48 (H)    Creatinine Latest Ref Range: 0.61 - 1.24 mg/dL 3.93 (H)    Calcium Latest Ref Range: 8.9 - 10.3 mg/dL 8.9    Anion gap Latest Ref Range: 5 - 15  10    Phosphorus Latest Ref Range: 2.5 - 4.6 mg/dL 4.1    Albumin Latest Ref Range: 3.5 - 5.0 g/dL 4.1    EGFR (African American) Latest Ref Range: >60 mL/min 17 (L)    EGFR (Non-African Amer.) Latest Ref Range: >60 mL/min 15 (L)    Iron Latest Ref Range: 45 - 182 ug/dL 68    UIBC Latest Units: ug/dL 223    TIBC Latest Ref Range: 250 - 450 ug/dL 291    Saturation Ratios Latest Ref Range: 17.9 - 39.5 % 23    Ferritin Latest Ref Range: 24 - 336 ng/mL 596 (H)    Hemoglobin Latest Ref Range: 13.0 - 17.0 g/dL   11.4 (L)  PTH Unknown 84 (H) Comment   Calcium, Total (PTH) Latest Ref Range: 8.6 - 10.2 mg/dL 9.2

## 2016-09-02 ENCOUNTER — Other Ambulatory Visit: Payer: Self-pay | Admitting: Vascular Surgery

## 2016-09-02 DIAGNOSIS — N184 Chronic kidney disease, stage 4 (severe): Secondary | ICD-10-CM

## 2016-09-02 DIAGNOSIS — Z01810 Encounter for preprocedural cardiovascular examination: Secondary | ICD-10-CM

## 2016-09-03 ENCOUNTER — Encounter (HOSPITAL_COMMUNITY)
Admission: RE | Admit: 2016-09-03 | Discharge: 2016-09-03 | Disposition: A | Discharge status: Home / Self Care | Payer: Medicare Other | Source: Ambulatory Visit | Attending: Nephrology | Admitting: Nephrology

## 2016-09-03 DIAGNOSIS — N183 Chronic kidney disease, stage 3 (moderate): Secondary | ICD-10-CM | POA: Insufficient documentation

## 2016-09-03 DIAGNOSIS — D631 Anemia in chronic kidney disease: Secondary | ICD-10-CM | POA: Diagnosis present

## 2016-09-03 LAB — RENAL FUNCTION PANEL
ALBUMIN: 4.1 g/dL (ref 3.5–5.0)
Anion gap: 11 (ref 5–15)
BUN: 53 mg/dL — AB (ref 6–20)
CO2: 25 mmol/L (ref 22–32)
CREATININE: 4.08 mg/dL — AB (ref 0.61–1.24)
Calcium: 8.8 mg/dL — ABNORMAL LOW (ref 8.9–10.3)
Chloride: 100 mmol/L — ABNORMAL LOW (ref 101–111)
GFR calc Af Amer: 16 mL/min — ABNORMAL LOW (ref >60)
GFR, EST NON AFRICAN AMERICAN: 14 mL/min — AB (ref >60)
Glucose, Bld: 121 mg/dL — ABNORMAL HIGH (ref 65–99)
PHOSPHORUS: 5 mg/dL — AB (ref 2.5–4.6)
Potassium: 3.4 mmol/L — ABNORMAL LOW (ref 3.5–5.1)
Sodium: 136 mmol/L (ref 135–145)

## 2016-09-03 LAB — IRON AND TIBC
Iron: 63 ug/dL (ref 45–182)
Saturation Ratios: 24 % (ref 17.9–39.5)
TIBC: 265 ug/dL (ref 250–450)
UIBC: 202 ug/dL

## 2016-09-03 LAB — FERRITIN: FERRITIN: 624 ng/mL — AB (ref 24–336)

## 2016-09-03 LAB — POCT HEMOGLOBIN-HEMACUE: Hemoglobin: 11.7 g/dL — ABNORMAL LOW (ref 13.0–17.0)

## 2016-09-06 NOTE — Progress Notes (Signed)
Results for AHMAD, VANWEY (MRN [id number]) as of 09/06/2016 13:35  Ref. Range 09/03/2016 07:03 09/03/2016 07:16  Sodium Latest Ref Range: 135 - 145 mmol/L 136   Potassium Latest Ref Range: 3.5 - 5.1 mmol/L 3.4 (L)   Chloride Latest Ref Range: 101 - 111 mmol/L 100 (L)   CO2 Latest Ref Range: 22 - 32 mmol/L 25   Glucose Latest Ref Range: 65 - 99 mg/dL 121 (H)   BUN Latest Ref Range: 6 - 20 mg/dL 53 (H)   Creatinine Latest Ref Range: 0.61 - 1.24 mg/dL 4.08 (H)   Calcium Latest Ref Range: 8.9 - 10.3 mg/dL 8.8 (L)   Anion gap Latest Ref Range: 5 - 15  11   Phosphorus Latest Ref Range: 2.5 - 4.6 mg/dL 5.0 (H)   Albumin Latest Ref Range: 3.5 - 5.0 g/dL 4.1   EGFR (African American) Latest Ref Range: >60 mL/min 16 (L)   EGFR (Non-African Amer.) Latest Ref Range: >60 mL/min 14 (L)   Iron Latest Ref Range: 45 - 182 ug/dL 63   UIBC Latest Units: ug/dL 202   TIBC Latest Ref Range: 250 - 450 ug/dL 265   Saturation Ratios Latest Ref Range: 17.9 - 39.5 % 24   Ferritin Latest Ref Range: 24 - 336 ng/mL 624 (H)   Hemoglobin Latest Ref Range: 13.0 - 17.0 g/dL  11.7 (L)

## 2016-10-01 ENCOUNTER — Encounter (HOSPITAL_COMMUNITY)
Admission: RE | Admit: 2016-10-01 | Discharge: 2016-10-01 | Disposition: A | Discharge status: Home / Self Care | Payer: Medicare Other | Source: Ambulatory Visit | Attending: Nephrology | Admitting: Nephrology

## 2016-10-01 DIAGNOSIS — N183 Chronic kidney disease, stage 3 (moderate): Secondary | ICD-10-CM | POA: Diagnosis present

## 2016-10-01 DIAGNOSIS — Z79899 Other long term (current) drug therapy: Secondary | ICD-10-CM | POA: Diagnosis not present

## 2016-10-01 DIAGNOSIS — Z5181 Encounter for therapeutic drug level monitoring: Secondary | ICD-10-CM | POA: Insufficient documentation

## 2016-10-01 DIAGNOSIS — D631 Anemia in chronic kidney disease: Secondary | ICD-10-CM | POA: Insufficient documentation

## 2016-10-01 LAB — RENAL FUNCTION PANEL
Albumin: 3.9 g/dL (ref 3.5–5.0)
Anion gap: 9 (ref 5–15)
BUN: 48 mg/dL — AB (ref 6–20)
CHLORIDE: 101 mmol/L (ref 101–111)
CO2: 27 mmol/L (ref 22–32)
Calcium: 8.7 mg/dL — ABNORMAL LOW (ref 8.9–10.3)
Creatinine, Ser: 4.39 mg/dL — ABNORMAL HIGH (ref 0.61–1.24)
GFR calc Af Amer: 15 mL/min — ABNORMAL LOW (ref >60)
GFR, EST NON AFRICAN AMERICAN: 13 mL/min — AB (ref >60)
Glucose, Bld: 99 mg/dL (ref 65–99)
POTASSIUM: 3.5 mmol/L (ref 3.5–5.1)
Phosphorus: 4.7 mg/dL — ABNORMAL HIGH (ref 2.5–4.6)
Sodium: 137 mmol/L (ref 135–145)

## 2016-10-01 LAB — POCT HEMOGLOBIN-HEMACUE: HEMOGLOBIN: 11.4 g/dL — AB (ref 13.0–17.0)

## 2016-10-01 MED ORDER — EPOETIN ALFA 10000 UNIT/ML IJ SOLN
10000.0000 [IU] | Freq: Once | INTRAMUSCULAR | Status: AC
  Administered 2016-10-01: 10000 [IU] via SUBCUTANEOUS

## 2016-10-01 MED ORDER — EPOETIN ALFA 10000 UNIT/ML IJ SOLN
INTRAMUSCULAR | Status: AC
  Filled 2016-10-01: qty 1

## 2016-10-02 LAB — PTH, INTACT AND CALCIUM
CALCIUM TOTAL (PTH): 8.7 mg/dL (ref 8.6–10.2)
PTH: 107 pg/mL — AB (ref 15–65)

## 2016-10-02 LAB — IRON AND TIBC
Iron: 62 ug/dL (ref 45–182)
SATURATION RATIOS: 24 % (ref 17.9–39.5)
TIBC: 260 ug/dL (ref 250–450)
UIBC: 198 ug/dL

## 2016-10-02 LAB — FERRITIN: Ferritin: 495 ng/mL — ABNORMAL HIGH (ref 24–336)

## 2016-10-07 NOTE — Progress Notes (Signed)
Results for Steven Perez, Steven Perez (MRN [id number]) as of 10/07/2016 14:45  Ref. Range 10/01/2016 07:19 10/01/2016 07:24  Sodium Latest Ref Range: 135 - 145 mmol/L  137  Potassium Latest Ref Range: 3.5 - 5.1 mmol/L  3.5  Chloride Latest Ref Range: 101 - 111 mmol/L  101  CO2 Latest Ref Range: 22 - 32 mmol/L  27  Glucose Latest Ref Range: 65 - 99 mg/dL  99  BUN Latest Ref Range: 6 - 20 mg/dL  48 (H)  Creatinine Latest Ref Range: 0.61 - 1.24 mg/dL  4.39 (H)  Calcium Latest Ref Range: 8.9 - 10.3 mg/dL  8.7 (L)  Anion gap Latest Ref Range: 5 - 15   9  Phosphorus Latest Ref Range: 2.5 - 4.6 mg/dL  4.7 (H)  Albumin Latest Ref Range: 3.5 - 5.0 g/dL  3.9  EGFR (African American) Latest Ref Range: >60 mL/min  15 (L)  EGFR (Non-African Amer.) Latest Ref Range: >60 mL/min  13 (L)  Iron Latest Ref Range: 45 - 182 ug/dL  62  UIBC Latest Units: ug/dL  198  TIBC Latest Ref Range: 250 - 450 ug/dL  260  Saturation Ratios Latest Ref Range: 17.9 - 39.5 %  24  Ferritin Latest Ref Range: 24 - 336 ng/mL  495 (H)  Hemoglobin Latest Ref Range: 13.0 - 17.0 g/dL 11.4 (L)   PTH Latest Ref Range: 15 - 65 pg/mL  107 (H)  Calcium, Total (PTH) Latest Ref Range: 8.6 - 10.2 mg/dL  8.7

## 2016-10-15 ENCOUNTER — Encounter: Payer: Self-pay | Admitting: Vascular Surgery

## 2016-10-21 ENCOUNTER — Ambulatory Visit (HOSPITAL_COMMUNITY)
Admission: RE | Admit: 2016-10-21 | Discharge: 2016-10-21 | Disposition: A | Discharge status: Home / Self Care | Payer: Medicare Other | Source: Ambulatory Visit | Attending: Vascular Surgery | Admitting: Vascular Surgery

## 2016-10-21 ENCOUNTER — Encounter: Payer: Self-pay | Admitting: Vascular Surgery

## 2016-10-21 ENCOUNTER — Ambulatory Visit (INDEPENDENT_AMBULATORY_CARE_PROVIDER_SITE_OTHER): Payer: Medicare Other | Admitting: Vascular Surgery

## 2016-10-21 ENCOUNTER — Ambulatory Visit (INDEPENDENT_AMBULATORY_CARE_PROVIDER_SITE_OTHER)
Admission: RE | Admit: 2016-10-21 | Discharge: 2016-10-21 | Disposition: A | Discharge status: Home / Self Care | Payer: Medicare Other | Source: Ambulatory Visit | Attending: Vascular Surgery | Admitting: Vascular Surgery

## 2016-10-21 VITALS — BP 166/83 | HR 54 | Temp 98.4°F | Resp 20 | Ht 68.0 in | Wt 236.0 lb

## 2016-10-21 DIAGNOSIS — N184 Chronic kidney disease, stage 4 (severe): Secondary | ICD-10-CM | POA: Insufficient documentation

## 2016-10-21 DIAGNOSIS — Z01810 Encounter for preprocedural cardiovascular examination: Secondary | ICD-10-CM | POA: Diagnosis not present

## 2016-10-21 NOTE — Progress Notes (Signed)
HISTORY AND PHYSICAL     CC:  Evaluation for dialysis access Requesting Provider: Dr. Posey Pronto  HPI: This is a 66 y.o. male who presents today for evaluation for hemodialysis access.  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  Pt states that he had a CT scan with contrast in the past and he was hospitalized 7 days later and ever since then, his kidneys have been on the decline.  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    He states that he is on the transplant list at [facility].  He is diabetic and is on insulin.  He is on a statin for cholesterol management.  He is on a beta blocker, CCB and HCTZ for blood pressure management.    The pt is right hand dominant.  Past Medical History:  Diagnosis Date  . Acute on chronic renal insufficiency   . Anemia    assoc w/ chronic reanl failure  . Arthralgia   . Chronic kidney disease    Stage 4  . Diabetes mellitus   . Dyslipidemia   . Erectile dysfunction due to arterial insufficiency   . HLD (hyperlipidemia)   . Hypertension   . Insomnia   . Left renal mass   . Obesity   . Secondary hyperparathyroidism of renal origin [facility])     Past Surgical History:  Procedure Laterality Date  . IR GENERIC HISTORICAL  06/04/2014   IR RADIOLOGIST EVAL & MGMT 06/04/2014 Aletta Edouard, MD [facility] INTERV RAD  . IR GENERIC HISTORICAL  01/15/2016   IR RADIOLOGIST EVAL & MGMT 01/15/2016 [facility] INTERV RAD  . RENAL BIOPSY    . RENAL BIOPSY, PERCUTANEOUS    . TONSILLECTOMY      Allergies  Allergen Reactions  . Contrast Media [Iodinated Diagnostic Agents] Other (See Comments)    Renal failure after administration of CT contrast    . Tetanus Toxoids Swelling    "bad reaction;" told not to take this again    Current Outpatient Prescriptions  Medication Sig Dispense Refill  . acetaminophen (TYLENOL) 500 MG tablet Take 500 mg by mouth at bedtime as needed for mild pain. Pain    . calcitRIOL  (ROCALTROL) 0.25 MCG capsule Take 0.25 mcg by mouth daily.    . carvedilol (COREG) 6.25 MG tablet Take 25 mg by mouth 2 (two) times daily with a meal. Tablet dose 12.5 mg. Take 1 and 1/2 tablets twice a day. For total dose of 18.75 mg po twice a day.    . doxazosin (CARDURA) 2 MG tablet Take 4 mg by mouth daily.     . furosemide (LASIX) 40 MG tablet Take 40 mg by mouth 2 (two) times daily.     . hydrALAZINE (APRESOLINE) 25 MG tablet Take 25 mg by mouth 2 (two) times daily.    . Insulin Degludec (TRESIBA FLEXTOUCH) 200 UNIT/ML SOPN Inject 20 Units into the skin at bedtime.    . simvastatin (ZOCOR) 40 MG tablet Take 40 mg by mouth daily.    . tadalafil (CIALIS) 10 MG tablet Take 10 mg by mouth daily as needed. Sexual Arousal    . traZODone (DESYREL) 100 MG tablet Take 100 mg by mouth at bedtime.    . verapamil (CALAN-SR) 180 MG CR tablet Take 2 tablets (360 mg total) by mouth at bedtime. 60 tablet 0  . Vitamin D, Ergocalciferol, (DRISDOL) 50000 UNITS CAPS capsule Take 50,000 Units by mouth every 7 (  seven) days.     No current facility-administered medications for this visit.     Family History  Problem Relation Age of Onset  . Diabetes Mellitus II Mother   . CAD Neg Hx   . Stroke Neg Hx     Social History   Social History  . Marital status: Married    Spouse name: N/A  . Number of children: N/A  . Years of education: N/A   Occupational History  . Not on file.   Social History Main Topics  . Smoking status: Former Research scientist (life sciences)  . Smokeless tobacco: Never Used  . Alcohol use No     Comment: occasionally  . Drug use: No  . Sexual activity: Yes   Other Topics Concern  . Not on file   Social History Narrative  . No narrative on file     ROS: [x]  Positive   [ ]  Negative   [ ]  All sytems reviewed and are negative  Cardiac: []  chest pain/pressure []  palpitations []  SOB lying flat []  DOE  Vascular: []  pain in legs while walking []  pain in feet when lying flat []  hx of  DVT []  hx of phlebitis [x]  swelling in legs-mild []  varicose veins  Pulmonary: []  productive cough []  asthma []  wheezing  Neurologic: []  weakness in []  arms []  legs []  numbness in []  arms []  legs [] difficulty speaking or slurred speech []  temporary loss of vision in one eye []  dizziness  Hematologic: []  bleeding problems []  problems with blood clotting easily  GI []  vomiting blood []  blood in stool  GU: [x]  CKD/renal failure  []  HD---[]  M/W/F []  T/T/F []  burning with urination []  blood in urine  Psychiatric: []  hx of major depression  Integumentary: []  rashes []  ulcers  Constitutional: []  fever []  chills  PHYSICAL EXAMINATION:  Vitals:   10/21/16 1104  BP: (!) 166/83  Pulse: (!) 54  Resp: 20  Temp: 98.4 F (36.9 C)   Body mass index is 35.88 kg/m.   Vitals:   10/21/16 1104  Weight: 236 lb (107 kg)  Height: 5\' 8"  (1.727 m)    General:  WDWN in NAD Gait: Normal HENT: WNL Pulmonary: normal non-labored breathing , without Rales, rhonchi,  wheezing Cardiac: regular, without  Murmurs, rubs or gallops; without carotid bruits Abdomen: obese Skin: without rashes, without ulcers  Vascular Exam/Pulses:   Right Left  Radial 2+ (normal) 2+ (normal)  Ulnar Unable to palpate  Unable to palpate   DP Unable to palpate  Unable to palpate   PT Unable to palpate  Unable to palpate    Extremities:  without ischemic changes, without Gangrene, without cellulitis; without open wounds;  Musculoskeletal: no muscle wasting or atrophy  Neurologic: A&O X 3; Moving all extremities equally;  Speech is fluent/normal   Non-Invasive Vascular Imaging:   Upper Extremity Vein Mapping on 10/19/16 Right Left   Cephalic Basilic  Cephalic Basilic  Diameter cm Diameter cm  Diameter cm Diameter cm  0.34  Shoulder 0.31   0.29 0.29 Upper Arm Proximal 0.29   0.34 0.36 Upper Arm Mid 0.30 0.27  0.28 0.30 Upper Arm Distal 0.36 0.33  0.27 0.25 Antecubital Fossa 0.39 0.22   0.20  Forearm proximal 0.24   0.26 tortuous  Forearm mid 0.24   0.19  Forearm distal 0.22     Visible branches           Upper Extremity Arterial Duplex Evaluation Prior to Dialysis Access 10/19/16: Right: Brachial 0.52 (T)  Radial 0.30 (T) Ulnar 0.26 (T)  Left: 0.50 (T) Radial 0.29 (T) Ulnar 0.20 (T)   ASSESSMENT/PLAN: 66 y.o. male with CKD IV in need of permanent dialysis access   -pt is not yet on dialysis  - pt is right hand dominant and his vein mapping reveals adequate left cephalic vein for [employer] AVF.  We will plan to schedule this on November 02, 2016.   -discussed no further blood draws in the left arm -pt's questions were answered   Leontine Locket, PA-C [facility] [phone]  Clinic MD:   Pt seen and examined in conjunction with Dr. Oneida Alar  History and exam findings as above. Patient has vein and arterial circulation adequate for a left brachiocephalic AV fistula. Risks benefits possible complications and procedure details including but not limited to non-maturation of the fistula tend to 15% bleeding infection 1% ischemic steal 1-2% were explained to the patient today. He understands and agrees to proceed. His is scheduled for 11/02/2016.  Ruta Hinds, MD [facility] [place] Office: [phone] Pager: [phone]

## 2016-10-26 ENCOUNTER — Other Ambulatory Visit: Payer: Self-pay | Admitting: *Deleted

## 2016-10-29 ENCOUNTER — Encounter (HOSPITAL_COMMUNITY)
Admission: RE | Admit: 2016-10-29 | Discharge: 2016-10-29 | Disposition: A | Discharge status: Home / Self Care | Payer: Medicare Other | Source: Ambulatory Visit | Attending: Nephrology | Admitting: Nephrology

## 2016-10-29 ENCOUNTER — Encounter (HOSPITAL_COMMUNITY): Payer: Self-pay

## 2016-10-29 DIAGNOSIS — D631 Anemia in chronic kidney disease: Secondary | ICD-10-CM | POA: Insufficient documentation

## 2016-10-29 DIAGNOSIS — N183 Chronic kidney disease, stage 3 (moderate): Secondary | ICD-10-CM | POA: Diagnosis not present

## 2016-10-29 LAB — IRON AND TIBC
Iron: 55 ug/dL (ref 45–182)
Saturation Ratios: 21 % (ref 17.9–39.5)
TIBC: 256 ug/dL (ref 250–450)
UIBC: 201 ug/dL

## 2016-10-29 LAB — RENAL FUNCTION PANEL
ANION GAP: 11 (ref 5–15)
Albumin: 4 g/dL (ref 3.5–5.0)
BUN: 51 mg/dL — ABNORMAL HIGH (ref 6–20)
CALCIUM: 8.9 mg/dL (ref 8.9–10.3)
CO2: 26 mmol/L (ref 22–32)
CREATININE: 4.34 mg/dL — AB (ref 0.61–1.24)
Chloride: 100 mmol/L — ABNORMAL LOW (ref 101–111)
GFR calc Af Amer: 15 mL/min — ABNORMAL LOW (ref >60)
GFR calc non Af Amer: 13 mL/min — ABNORMAL LOW (ref >60)
GLUCOSE: 115 mg/dL — AB (ref 65–99)
Phosphorus: 4.7 mg/dL — ABNORMAL HIGH (ref 2.5–4.6)
Potassium: 3.6 mmol/L (ref 3.5–5.1)
SODIUM: 137 mmol/L (ref 135–145)

## 2016-10-29 LAB — POCT HEMOGLOBIN-HEMACUE: HEMOGLOBIN: 12.1 g/dL — AB (ref 13.0–17.0)

## 2016-10-29 LAB — FERRITIN: Ferritin: 596 ng/mL — ABNORMAL HIGH (ref 24–336)

## 2016-10-29 MED ORDER — EPOETIN ALFA 10000 UNIT/ML IJ SOLN: 10000.0000 [IU] | INTRAMUSCULAR | Status: DC

## 2016-10-30 ENCOUNTER — Encounter (HOSPITAL_COMMUNITY): Payer: Self-pay | Admitting: *Deleted

## 2016-10-30 LAB — PTH, INTACT AND CALCIUM
CALCIUM TOTAL (PTH): 9.1 mg/dL (ref 8.6–10.2)
PTH: 110 pg/mL — AB (ref 15–65)

## 2016-10-30 NOTE — Progress Notes (Signed)
Denies chest pain, shortness of breath. Patient states he does not check blood sugars or have a machine to check blood sugars. I requested that patient come in early if he feel like his blood sugars are dropping.

## 2016-11-01 ENCOUNTER — Encounter (HOSPITAL_COMMUNITY): Payer: Self-pay | Admitting: Anesthesiology

## 2016-11-01 NOTE — Anesthesia Preprocedure Evaluation (Addendum)
Anesthesia Evaluation  Patient identified by MRN, date of birth, ID band Patient awake    Reviewed: Allergy & Precautions, NPO status , Patient's Chart, lab work & pertinent test results  Airway Mallampati: II  TM Distance: >3 FB Neck ROM: Full    Dental  (+) Teeth Intact, Dental Advisory Given   Pulmonary neg pulmonary ROS, former smoker,    breath sounds clear to auscultation       Cardiovascular hypertension, negative cardio ROS   Rhythm:Regular Rate:Normal     Neuro/Psych negative neurological ROS  negative psych ROS   GI/Hepatic negative GI ROS, Neg liver ROS,   Endo/Other  diabetes, Type 2  Renal/GU CRFRenal disease  negative genitourinary   Musculoskeletal negative musculoskeletal ROS (+)   Abdominal   Peds negative pediatric ROS (+)  Hematology negative hematology ROS (+)   Anesthesia Other Findings Day of surgery medications reviewed with the patient. - HLD  Reproductive/Obstetrics negative OB ROS                            Anesthesia Physical Anesthesia Plan  ASA: III  Anesthesia Plan: General   Post-op Pain Management:    Induction: Intravenous  PONV Risk Score and Plan: 2 and Ondansetron, Treatment may vary due to age and Propofol  Airway Management Planned: LMA  Additional Equipment:   Intra-op Plan:   Post-operative Plan:   Informed Consent: I have reviewed the patients History and Physical, chart, labs and discussed the procedure including the risks, benefits and alternatives for the proposed anesthesia with the patient or authorized representative who has indicated his/her understanding and acceptance.     Plan Discussed with:   Anesthesia Plan Comments:       Anesthesia Quick Evaluation

## 2016-11-02 ENCOUNTER — Ambulatory Visit (HOSPITAL_COMMUNITY): Payer: Medicare Other | Admitting: Anesthesiology

## 2016-11-02 ENCOUNTER — Encounter (HOSPITAL_COMMUNITY): Payer: Self-pay | Admitting: Anesthesiology

## 2016-11-02 ENCOUNTER — Ambulatory Visit (HOSPITAL_COMMUNITY)
Admission: RE | Admit: 2016-11-02 | Discharge: 2016-11-02 | Disposition: A | Discharge status: Home / Self Care | Payer: Medicare Other | Source: Ambulatory Visit | Attending: Vascular Surgery | Admitting: Vascular Surgery

## 2016-11-02 ENCOUNTER — Encounter (HOSPITAL_COMMUNITY)
Admission: RE | Disposition: A | Discharge status: Home / Self Care | Payer: Self-pay | Source: Ambulatory Visit | Attending: Vascular Surgery

## 2016-11-02 DIAGNOSIS — E785 Hyperlipidemia, unspecified: Secondary | ICD-10-CM | POA: Diagnosis not present

## 2016-11-02 DIAGNOSIS — I129 Hypertensive chronic kidney disease with stage 1 through stage 4 chronic kidney disease, or unspecified chronic kidney disease: Secondary | ICD-10-CM | POA: Insufficient documentation

## 2016-11-02 DIAGNOSIS — E1122 Type 2 diabetes mellitus with diabetic chronic kidney disease: Secondary | ICD-10-CM | POA: Diagnosis not present

## 2016-11-02 DIAGNOSIS — Z794 Long term (current) use of insulin: Secondary | ICD-10-CM | POA: Diagnosis not present

## 2016-11-02 DIAGNOSIS — Z79899 Other long term (current) drug therapy: Secondary | ICD-10-CM | POA: Diagnosis not present

## 2016-11-02 DIAGNOSIS — N184 Chronic kidney disease, stage 4 (severe): Secondary | ICD-10-CM | POA: Insufficient documentation

## 2016-11-02 DIAGNOSIS — N185 Chronic kidney disease, stage 5: Secondary | ICD-10-CM | POA: Diagnosis not present

## 2016-11-02 DIAGNOSIS — D631 Anemia in chronic kidney disease: Secondary | ICD-10-CM | POA: Diagnosis not present

## 2016-11-02 DIAGNOSIS — N5201 Erectile dysfunction due to arterial insufficiency: Secondary | ICD-10-CM | POA: Insufficient documentation

## 2016-11-02 DIAGNOSIS — Z87891 Personal history of nicotine dependence: Secondary | ICD-10-CM | POA: Diagnosis not present

## 2016-11-02 HISTORY — DX: Type 2 diabetes mellitus without complications: E11.9

## 2016-11-02 HISTORY — PX: AV FISTULA PLACEMENT: SHX1204

## 2016-11-02 LAB — POCT I-STAT 4, (NA,K, GLUC, HGB,HCT)
GLUCOSE: 115 mg/dL — AB (ref 65–99)
HEMATOCRIT: 35 % — AB (ref 39.0–52.0)
HEMOGLOBIN: 11.9 g/dL — AB (ref 13.0–17.0)
POTASSIUM: 3.7 mmol/L (ref 3.5–5.1)
SODIUM: 140 mmol/L (ref 135–145)

## 2016-11-02 LAB — GLUCOSE, CAPILLARY: Glucose-Capillary: 108 mg/dL — ABNORMAL HIGH (ref 65–99)

## 2016-11-02 SURGERY — ARTERIOVENOUS (AV) FISTULA CREATION
Anesthesia: General | Site: Arm Upper | Laterality: Left

## 2016-11-02 MED ORDER — HEPARIN SODIUM (PORCINE) 1000 UNIT/ML IJ SOLN
INTRAMUSCULAR | Status: DC | PRN
  Administered 2016-11-02: 5000 [IU] via INTRAVENOUS

## 2016-11-02 MED ORDER — LACTATED RINGERS IV SOLN
INTRAVENOUS | Status: DC | PRN
Start: 2016-11-02 — End: 2016-11-02
  Administered 2016-11-02: 07:00:00 via INTRAVENOUS

## 2016-11-02 MED ORDER — FENTANYL CITRATE (PF) 100 MCG/2ML IJ SOLN
INTRAMUSCULAR | Status: DC | PRN
  Administered 2016-11-02 (×2): 50 ug via INTRAVENOUS
  Administered 2016-11-02: 25 ug via INTRAVENOUS

## 2016-11-02 MED ORDER — HEPARIN SODIUM (PORCINE) 5000 UNIT/ML IJ SOLN
INTRAMUSCULAR | Status: DC | PRN
  Administered 2016-11-02: 07:00:00

## 2016-11-02 MED ORDER — FENTANYL CITRATE (PF) 250 MCG/5ML IJ SOLN
INTRAMUSCULAR | Status: AC
  Filled 2016-11-02: qty 5

## 2016-11-02 MED ORDER — PROPOFOL 10 MG/ML IV BOLUS
INTRAVENOUS | Status: DC | PRN
  Administered 2016-11-02: 130 mg via INTRAVENOUS
  Administered 2016-11-02: 20 mg via INTRAVENOUS
  Administered 2016-11-02: 30 mg via INTRAVENOUS

## 2016-11-02 MED ORDER — PROPOFOL 10 MG/ML IV BOLUS
INTRAVENOUS | Status: AC
  Filled 2016-11-02: qty 40

## 2016-11-02 MED ORDER — LIDOCAINE 2% (20 MG/ML) 5 ML SYRINGE
INTRAMUSCULAR | Status: AC
  Filled 2016-11-02: qty 5

## 2016-11-02 MED ORDER — ONDANSETRON HCL 4 MG/2ML IJ SOLN
INTRAMUSCULAR | Status: AC
  Filled 2016-11-02: qty 4

## 2016-11-02 MED ORDER — HEPARIN SODIUM (PORCINE) 1000 UNIT/ML IJ SOLN
INTRAMUSCULAR | Status: AC
  Filled 2016-11-02: qty 1

## 2016-11-02 MED ORDER — PROTAMINE SULFATE 10 MG/ML IV SOLN
INTRAVENOUS | Status: AC
  Filled 2016-11-02: qty 25

## 2016-11-02 MED ORDER — ONDANSETRON HCL 4 MG/2ML IJ SOLN
INTRAMUSCULAR | Status: DC | PRN
Start: 2016-11-02 — End: 2016-11-02
  Administered 2016-11-02: 4 mg via INTRAVENOUS

## 2016-11-02 MED ORDER — SODIUM CHLORIDE 0.9 % IV SOLN: INTRAVENOUS | Status: DC

## 2016-11-02 MED ORDER — GLYCOPYRROLATE 0.2 MG/ML IJ SOLN
INTRAMUSCULAR | Status: DC | PRN
  Administered 2016-11-02 (×2): 0.2 mg via INTRAVENOUS
  Administered 2016-11-02: 0.1 mg via INTRAVENOUS

## 2016-11-02 MED ORDER — LIDOCAINE HCL (CARDIAC) 20 MG/ML IV SOLN
INTRAVENOUS | Status: DC | PRN
  Administered 2016-11-02: 60 mg via INTRAVENOUS

## 2016-11-02 MED ORDER — LIDOCAINE HCL 1 % IJ SOLN
INTRAMUSCULAR | Status: AC
  Filled 2016-11-02: qty 40

## 2016-11-02 MED ORDER — MIDAZOLAM HCL 5 MG/5ML IJ SOLN
INTRAMUSCULAR | Status: DC | PRN
  Administered 2016-11-02 (×2): 1 mg via INTRAVENOUS

## 2016-11-02 MED ORDER — 0.9 % SODIUM CHLORIDE (POUR BTL) OPTIME
TOPICAL | Status: DC | PRN
  Administered 2016-11-02: 1000 mL

## 2016-11-02 MED ORDER — OXYCODONE-ACETAMINOPHEN 5-325 MG PO TABS: 1.0000 | ORAL_TABLET | Freq: Four times a day (QID) | ORAL | 0 refills | Status: DC | PRN

## 2016-11-02 MED ORDER — DEXTROSE 5 % IV SOLN
1.5000 g | INTRAVENOUS | Status: AC
  Administered 2016-11-02: 1.5 g via INTRAVENOUS
  Filled 2016-11-02: qty 1.5

## 2016-11-02 MED ORDER — MIDAZOLAM HCL 2 MG/2ML IJ SOLN
INTRAMUSCULAR | Status: AC
  Filled 2016-11-02: qty 2

## 2016-11-02 SURGICAL SUPPLY — 32 items
ARMBAND PINK RESTRICT EXTREMIT (MISCELLANEOUS) ×2 IMPLANT
CANISTER SUCT 3000ML PPV (MISCELLANEOUS) ×2 IMPLANT
CANNULA VESSEL 3MM 2 BLNT TIP (CANNULA) ×2 IMPLANT
CLIP TI MEDIUM 6 (CLIP) ×2 IMPLANT
CLIP TI WIDE RED SMALL 6 (CLIP) ×2 IMPLANT
COVER PROBE W GEL 5X96 (DRAPES) IMPLANT
DECANTER SPIKE VIAL GLASS SM (MISCELLANEOUS) IMPLANT
DERMABOND ADVANCED (GAUZE/BANDAGES/DRESSINGS) ×1
DERMABOND ADVANCED .7 DNX12 (GAUZE/BANDAGES/DRESSINGS) ×1 IMPLANT
DRAIN PENROSE 1/4X12 LTX STRL (WOUND CARE) ×2 IMPLANT
ELECT REM PT RETURN 9FT ADLT (ELECTROSURGICAL) ×2
ELECTRODE REM PT RTRN 9FT ADLT (ELECTROSURGICAL) ×1 IMPLANT
GLOVE BIO SURGEON STRL SZ 6.5 (GLOVE) ×4 IMPLANT
GLOVE BIO SURGEON STRL SZ7.5 (GLOVE) ×2 IMPLANT
GLOVE BIOGEL PI IND STRL 6.5 (GLOVE) ×3 IMPLANT
GLOVE BIOGEL PI INDICATOR 6.5 (GLOVE) ×3
GLOVE ECLIPSE 6.5 STRL STRAW (GLOVE) ×2 IMPLANT
GOWN STRL REUS W/ TWL LRG LVL3 (GOWN DISPOSABLE) ×3 IMPLANT
GOWN STRL REUS W/TWL LRG LVL3 (GOWN DISPOSABLE) ×3
KIT BASIN OR (CUSTOM PROCEDURE TRAY) ×2 IMPLANT
KIT ROOM TURNOVER OR (KITS) ×2 IMPLANT
LOOP VESSEL MINI RED (MISCELLANEOUS) IMPLANT
NS IRRIG 1000ML POUR BTL (IV SOLUTION) ×2 IMPLANT
PACK CV ACCESS (CUSTOM PROCEDURE TRAY) ×2 IMPLANT
PAD ARMBOARD 7.5X6 YLW CONV (MISCELLANEOUS) ×4 IMPLANT
SPONGE SURGIFOAM ABS GEL 100 (HEMOSTASIS) IMPLANT
SUT PROLENE 7 0 BV 1 (SUTURE) ×2 IMPLANT
SUT VIC AB 3-0 SH 27 (SUTURE) ×1
SUT VIC AB 3-0 SH 27X BRD (SUTURE) ×1 IMPLANT
SUT VICRYL 4-0 PS2 18IN ABS (SUTURE) ×2 IMPLANT
UNDERPAD 30X30 (UNDERPADS AND DIAPERS) ×2 IMPLANT
WATER STERILE IRR 1000ML POUR (IV SOLUTION) ×2 IMPLANT

## 2016-11-02 NOTE — H&P (View-Only) (Signed)
HISTORY AND PHYSICAL     CC:  Evaluation for dialysis access Requesting Provider: Dr. Posey Pronto  HPI: This is a 66 y.o. male who presents today for evaluation for hemodialysis access.  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  Pt states that he had a CT scan with contrast in the past and he was hospitalized 7 days later and ever since then, his kidneys have been on the decline.  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    He states that he is on the transplant list at [facility].  He is diabetic and is on insulin.  He is on a statin for cholesterol management.  He is on a beta blocker, CCB and HCTZ for blood pressure management.    The pt is right hand dominant.  Past Medical History:  Diagnosis Date  . Acute on chronic renal insufficiency   . Anemia    assoc w/ chronic reanl failure  . Arthralgia   . Chronic kidney disease    Stage 4  . Diabetes mellitus   . Dyslipidemia   . Erectile dysfunction due to arterial insufficiency   . HLD (hyperlipidemia)   . Hypertension   . Insomnia   . Left renal mass   . Obesity   . Secondary hyperparathyroidism of renal origin [facility])     Past Surgical History:  Procedure Laterality Date  . IR GENERIC HISTORICAL  06/04/2014   IR RADIOLOGIST EVAL & MGMT 06/04/2014 Aletta Edouard, MD [facility] INTERV RAD  . IR GENERIC HISTORICAL  01/15/2016   IR RADIOLOGIST EVAL & MGMT 01/15/2016 [facility] INTERV RAD  . RENAL BIOPSY    . RENAL BIOPSY, PERCUTANEOUS    . TONSILLECTOMY      Allergies  Allergen Reactions  . Contrast Media [Iodinated Diagnostic Agents] Other (See Comments)    Renal failure after administration of CT contrast    . Tetanus Toxoids Swelling    "bad reaction;" told not to take this again    Current Outpatient Prescriptions  Medication Sig Dispense Refill  . acetaminophen (TYLENOL) 500 MG tablet Take 500 mg by mouth at bedtime as needed for mild pain. Pain    . calcitRIOL  (ROCALTROL) 0.25 MCG capsule Take 0.25 mcg by mouth daily.    . carvedilol (COREG) 6.25 MG tablet Take 25 mg by mouth 2 (two) times daily with a meal. Tablet dose 12.5 mg. Take 1 and 1/2 tablets twice a day. For total dose of 18.75 mg po twice a day.    . doxazosin (CARDURA) 2 MG tablet Take 4 mg by mouth daily.     . furosemide (LASIX) 40 MG tablet Take 40 mg by mouth 2 (two) times daily.     . hydrALAZINE (APRESOLINE) 25 MG tablet Take 25 mg by mouth 2 (two) times daily.    . Insulin Degludec (TRESIBA FLEXTOUCH) 200 UNIT/ML SOPN Inject 20 Units into the skin at bedtime.    . simvastatin (ZOCOR) 40 MG tablet Take 40 mg by mouth daily.    . tadalafil (CIALIS) 10 MG tablet Take 10 mg by mouth daily as needed. Sexual Arousal    . traZODone (DESYREL) 100 MG tablet Take 100 mg by mouth at bedtime.    . verapamil (CALAN-SR) 180 MG CR tablet Take 2 tablets (360 mg total) by mouth at bedtime. 60 tablet 0  . Vitamin D, Ergocalciferol, (DRISDOL) 50000 UNITS CAPS capsule Take 50,000 Units by mouth every 7 (  seven) days.     No current facility-administered medications for this visit.     Family History  Problem Relation Age of Onset  . Diabetes Mellitus II Mother   . CAD Neg Hx   . Stroke Neg Hx     Social History   Social History  . Marital status: Married    Spouse name: N/A  . Number of children: N/A  . Years of education: N/A   Occupational History  . Not on file.   Social History Main Topics  . Smoking status: Former Research scientist (life sciences)  . Smokeless tobacco: Never Used  . Alcohol use No     Comment: occasionally  . Drug use: No  . Sexual activity: Yes   Other Topics Concern  . Not on file   Social History Narrative  . No narrative on file     ROS: [x]  Positive   [ ]  Negative   [ ]  All sytems reviewed and are negative  Cardiac: []  chest pain/pressure []  palpitations []  SOB lying flat []  DOE  Vascular: []  pain in legs while walking []  pain in feet when lying flat []  hx of  DVT []  hx of phlebitis [x]  swelling in legs-mild []  varicose veins  Pulmonary: []  productive cough []  asthma []  wheezing  Neurologic: []  weakness in []  arms []  legs []  numbness in []  arms []  legs [] difficulty speaking or slurred speech []  temporary loss of vision in one eye []  dizziness  Hematologic: []  bleeding problems []  problems with blood clotting easily  GI []  vomiting blood []  blood in stool  GU: [x]  CKD/renal failure  []  HD---[]  M/W/F []  T/T/F []  burning with urination []  blood in urine  Psychiatric: []  hx of major depression  Integumentary: []  rashes []  ulcers  Constitutional: []  fever []  chills  PHYSICAL EXAMINATION:  Vitals:   10/21/16 1104  BP: (!) 166/83  Pulse: (!) 54  Resp: 20  Temp: 98.4 F (36.9 C)   Body mass index is 35.88 kg/m.   Vitals:   10/21/16 1104  Weight: 236 lb (107 kg)  Height: 5\' 8"  (1.727 m)    General:  WDWN in NAD Gait: Normal HENT: WNL Pulmonary: normal non-labored breathing , without Rales, rhonchi,  wheezing Cardiac: regular, without  Murmurs, rubs or gallops; without carotid bruits Abdomen: obese Skin: without rashes, without ulcers  Vascular Exam/Pulses:   Right Left  Radial 2+ (normal) 2+ (normal)  Ulnar Unable to palpate  Unable to palpate   DP Unable to palpate  Unable to palpate   PT Unable to palpate  Unable to palpate    Extremities:  without ischemic changes, without Gangrene, without cellulitis; without open wounds;  Musculoskeletal: no muscle wasting or atrophy  Neurologic: A&O X 3; Moving all extremities equally;  Speech is fluent/normal   Non-Invasive Vascular Imaging:   Upper Extremity Vein Mapping on 10/19/16 Right Left   Cephalic Basilic  Cephalic Basilic  Diameter cm Diameter cm  Diameter cm Diameter cm  0.34  Shoulder 0.31   0.29 0.29 Upper Arm Proximal 0.29   0.34 0.36 Upper Arm Mid 0.30 0.27  0.28 0.30 Upper Arm Distal 0.36 0.33  0.27 0.25 Antecubital Fossa 0.39 0.22   0.20  Forearm proximal 0.24   0.26 tortuous  Forearm mid 0.24   0.19  Forearm distal 0.22     Visible branches           Upper Extremity Arterial Duplex Evaluation Prior to Dialysis Access 10/19/16: Right: Brachial 0.52 (T)  Radial 0.30 (T) Ulnar 0.26 (T)  Left: 0.50 (T) Radial 0.29 (T) Ulnar 0.20 (T)   ASSESSMENT/PLAN: 66 y.o. male with CKD IV in need of permanent dialysis access   -pt is not yet on dialysis  - pt is right hand dominant and his vein mapping reveals adequate left cephalic vein for [facility] AVF.  We will plan to schedule this on November 02, 2016.   -discussed no further blood draws in the left arm -pt's questions were answered   Leontine Locket, PA-C [facility] [phone]  Clinic MD:   Pt seen and examined in conjunction with Dr. Oneida Alar  History and exam findings as above. Patient has vein and arterial circulation adequate for a left brachiocephalic AV fistula. Risks benefits possible complications and procedure details including but not limited to non-maturation of the fistula tend to 15% bleeding infection 1% ischemic steal 1-2% were explained to the patient today. He understands and agrees to proceed. His is scheduled for 11/02/2016.  Ruta Hinds, MD [facility] [place] Office: [phone] Pager: [phone]

## 2016-11-02 NOTE — Anesthesia Procedure Notes (Signed)
Procedure Name: LMA Insertion Date/Time: 11/02/2016 7:40 AM Performed by: Scheryl Darter Pre-anesthesia Checklist: Patient identified, Emergency Drugs available, Suction available, Patient being monitored and Timeout performed Patient Re-evaluated:Patient Re-evaluated prior to inductionOxygen Delivery Method: Circle System Utilized Preoxygenation: Pre-oxygenation with 100% oxygen Intubation Type: IV induction Ventilation: Mask ventilation without difficulty LMA: LMA inserted LMA Size: 5.0 Number of attempts: 1 Placement Confirmation: positive ETCO2 Tube secured with: Tape Dental Injury: Teeth and Oropharynx as per pre-operative assessment

## 2016-11-02 NOTE — Op Note (Signed)
Procedure: Left Brachial Cephalic AV fistula  Preop: ESRD  Postop: ESRD  Anesthesia: General  Assistant: Leontine Locket PA-C  Findings: 3.5 mm cephalic vein  Procedure: After obtaining informed consent, the patient was taken to the operating room.  After induction of general anesthesia, the left upper extremity was prepped and draped in usual sterile fashion.  A transverse incision was then made near the antecubital crease the left arm. The incision was carried into the subcutaneous tissues down to level of the cephalic vein. The cephalic vein was approximately 3.5 mm in diameter. It was of good quality. This was dissected free circumferentially and small side branches ligated and divided between silk ties or clips. Next the brachial artery was dissected free in the medial portion of the incision. The artery was  3-4 mm in diameter. The vessel loops were placed proximal and distal to the planned site of arteriotomy. The patient was given 5000 units of intravenous heparin. After appropriate circulation time, the vessel loops were used to control the artery. A longitudinal opening was made in the brachial artery.  The vein was ligated distally with a 2-0 silk tie. The vein was controlled proximally with a fine bulldog clamp. The vein was then swung over to the artery and sewn end of vein to side of artery using a running 7-0 Prolene suture. Just prior to completion of the anastomosis, everything was fore bled back bled and thoroughly flushed. The anastomosis was secured, vessel loops released, and there was a palpable thrill in the fistula immediately. After hemostasis was obtained, the subcutaneous tissues were reapproximated using a running 3-0 Vicryl suture. The skin was then closed with a 4 Vicryl subcuticular stitch. Dermabond was applied to the skin incision.  The patient had a palpable radial pulse at the end of the case.  Ruta Hinds, MD [facility] [place] Office:  [phone] Pager: [phone]

## 2016-11-02 NOTE — Discharge Instructions (Signed)
° ° °  11/02/2016 Steven Perez [id number] 11-06-1950  Surgeon(s): Fields, Jessy Oto, MD  Procedure(s): Creation of left brachial cephalic AV fistula  x Do not stick fistula for 12 weeks

## 2016-11-02 NOTE — Anesthesia Postprocedure Evaluation (Signed)
Anesthesia Post Note  Patient: Steven Perez  Procedure(s) Performed: Procedure(s) (LRB): ARTERIOVENOUS (AV) FISTULA CREATION LEFT UPPER ARM (Left)     Patient location during evaluation: PACU Anesthesia Type: General Level of consciousness: awake and alert Pain management: pain level controlled Vital Signs Assessment: post-procedure vital signs reviewed and stable Respiratory status: spontaneous breathing, nonlabored ventilation, respiratory function stable and patient connected to nasal cannula oxygen Cardiovascular status: blood pressure returned to baseline and stable Postop Assessment: no signs of nausea or vomiting Anesthetic complications: no    Last Vitals:  Vitals:   11/02/16 0915 11/02/16 0929  BP: (!) 146/78 (!) 144/76  Pulse: 66 63  Resp: 18 18  Temp:  36.7 C    Last Pain:  Vitals:   11/02/16 0651  TempSrc: Oral                 Effie Berkshire

## 2016-11-02 NOTE — Interval H&P Note (Signed)
History and Physical Interval Note:  11/02/2016 7:21 AM  Steven Perez  has presented today for surgery, with the diagnosis of chronic kidney disease Stage 4  The various methods of treatment have been discussed with the patient and family. After consideration of risks, benefits and other options for treatment, the patient has consented to  Procedure(s): ARTERIOVENOUS (AV) FISTULA CREATION LEFT UPPER ARM (Left) as a surgical intervention .  The patient's history has been reviewed, patient examined, no change in status, stable for surgery.  I have reviewed the patient's chart and labs.  Questions were answered to the patient's satisfaction.     Ruta Hinds

## 2016-11-02 NOTE — Transfer of Care (Signed)
Immediate Anesthesia Transfer of Care Note  Patient: Steven Perez  Procedure(s) Performed: Procedure(s): ARTERIOVENOUS (AV) FISTULA CREATION LEFT UPPER ARM (Left)  Patient Location: PACU  Anesthesia Type:General  Level of Consciousness: awake, alert , oriented and sedated  Airway & Oxygen Therapy: Patient Spontanous Breathing and Patient connected to nasal cannula oxygen  Post-op Assessment: Report given to RN, Post -op Vital signs reviewed and stable and Patient moving all extremities  Post vital signs: Reviewed and stable  Last Vitals:  Vitals:   11/02/16 0651  BP: (!) 162/72  Pulse: (!) 56  Resp: 18  Temp: 36.8 C    Last Pain:  Vitals:   11/02/16 0651  TempSrc: Oral         Complications: No apparent anesthesia complications

## 2016-11-02 NOTE — Progress Notes (Signed)
Results for Steven Perez, Steven Perez (MRN [id number]) as of 11/02/2016 13:37  Ref. Range 10/29/2016 07:30  Sodium Latest Ref Range: 135 - 145 mmol/L 137  Potassium Latest Ref Range: 3.5 - 5.1 mmol/L 3.6  Chloride Latest Ref Range: 101 - 111 mmol/L 100 (L)  CO2 Latest Ref Range: 22 - 32 mmol/L 26  Glucose Latest Ref Range: 65 - 99 mg/dL 115 (H)  BUN Latest Ref Range: 6 - 20 mg/dL 51 (H)  Creatinine Latest Ref Range: 0.61 - 1.24 mg/dL 4.34 (H)  Calcium Latest Ref Range: 8.9 - 10.3 mg/dL 8.9  Anion gap Latest Ref Range: 5 - 15  11  Phosphorus Latest Ref Range: 2.5 - 4.6 mg/dL 4.7 (H)  Albumin Latest Ref Range: 3.5 - 5.0 g/dL 4.0  EGFR (African American) Latest Ref Range: >60 mL/min 15 (L)  EGFR (Non-African Amer.) Latest Ref Range: >60 mL/min 13 (L)  Iron Latest Ref Range: 45 - 182 ug/dL 55  UIBC Latest Units: ug/dL 201  TIBC Latest Ref Range: 250 - 450 ug/dL 256  Saturation Ratios Latest Ref Range: 17.9 - 39.5 % 21  Ferritin Latest Ref Range: 24 - 336 ng/mL 596 (H)  PTH Latest Ref Range: 15 - 65 pg/mL 110 (H)  Calcium, Total (PTH) Latest Ref Range: 8.6 - 10.2 mg/dL 9.1

## 2016-11-03 ENCOUNTER — Encounter (HOSPITAL_COMMUNITY): Payer: Self-pay | Admitting: Vascular Surgery

## 2016-11-25 ENCOUNTER — Other Ambulatory Visit (HOSPITAL_COMMUNITY): Payer: Self-pay

## 2016-11-26 ENCOUNTER — Encounter (HOSPITAL_COMMUNITY)
Admission: RE | Admit: 2016-11-26 | Discharge: 2016-11-26 | Disposition: A | Discharge status: Home / Self Care | Payer: Medicare Other | Source: Ambulatory Visit | Attending: Nephrology | Admitting: Nephrology

## 2016-11-26 DIAGNOSIS — N183 Chronic kidney disease, stage 3 (moderate): Secondary | ICD-10-CM | POA: Diagnosis not present

## 2016-11-26 LAB — POCT HEMOGLOBIN-HEMACUE: HEMOGLOBIN: 10.9 g/dL — AB (ref 13.0–17.0)

## 2016-11-26 MED ORDER — EPOETIN ALFA 10000 UNIT/ML IJ SOLN
10000.0000 [IU] | INTRAMUSCULAR | Status: DC
  Administered 2016-11-26: 10000 [IU] via SUBCUTANEOUS

## 2016-11-26 MED ORDER — EPOETIN ALFA 10000 UNIT/ML IJ SOLN
INTRAMUSCULAR | Status: AC
  Filled 2016-11-26: qty 1

## 2016-12-24 ENCOUNTER — Encounter (HOSPITAL_COMMUNITY)
Admission: RE | Admit: 2016-12-24 | Discharge: 2016-12-24 | Disposition: A | Discharge status: Home / Self Care | Payer: Medicare Other | Source: Ambulatory Visit | Attending: Nephrology | Admitting: Nephrology

## 2016-12-24 DIAGNOSIS — D631 Anemia in chronic kidney disease: Secondary | ICD-10-CM | POA: Insufficient documentation

## 2016-12-24 LAB — RENAL FUNCTION PANEL
ALBUMIN: 4.2 g/dL (ref 3.5–5.0)
Anion gap: 11 (ref 5–15)
BUN: 60 mg/dL — AB (ref 6–20)
CALCIUM: 8.7 mg/dL — AB (ref 8.9–10.3)
CO2: 25 mmol/L (ref 22–32)
CREATININE: 4.34 mg/dL — AB (ref 0.61–1.24)
Chloride: 101 mmol/L (ref 101–111)
GFR calc Af Amer: 15 mL/min — ABNORMAL LOW (ref >60)
GFR, EST NON AFRICAN AMERICAN: 13 mL/min — AB (ref >60)
Glucose, Bld: 99 mg/dL (ref 65–99)
PHOSPHORUS: 4.7 mg/dL — AB (ref 2.5–4.6)
Potassium: 3.6 mmol/L (ref 3.5–5.1)
Sodium: 137 mmol/L (ref 135–145)

## 2016-12-24 LAB — IRON AND TIBC
IRON: 58 ug/dL (ref 45–182)
Saturation Ratios: 21 % (ref 17.9–39.5)
TIBC: 283 ug/dL (ref 250–450)
UIBC: 225 ug/dL

## 2016-12-24 LAB — FERRITIN: FERRITIN: 505 ng/mL — AB (ref 24–336)

## 2016-12-24 MED ORDER — EPOETIN ALFA 10000 UNIT/ML IJ SOLN
10000.0000 [IU] | INTRAMUSCULAR | Status: DC
  Administered 2016-12-24: 10000 [IU] via SUBCUTANEOUS

## 2016-12-24 MED ORDER — EPOETIN ALFA 10000 UNIT/ML IJ SOLN
INTRAMUSCULAR | Status: AC
  Filled 2016-12-24: qty 1

## 2016-12-25 LAB — PTH, INTACT AND CALCIUM
CALCIUM TOTAL (PTH): 9.1 mg/dL (ref 8.6–10.2)
PTH: 114 pg/mL — ABNORMAL HIGH (ref 15–65)

## 2016-12-28 ENCOUNTER — Other Ambulatory Visit (HOSPITAL_COMMUNITY): Payer: Self-pay | Admitting: Interventional Radiology

## 2016-12-28 DIAGNOSIS — N2889 Other specified disorders of kidney and ureter: Secondary | ICD-10-CM

## 2016-12-28 LAB — POCT HEMOGLOBIN-HEMACUE: Hemoglobin: 11.2 g/dL — ABNORMAL LOW (ref 13.0–17.0)

## 2016-12-28 NOTE — Progress Notes (Signed)
Results for Steven Perez, Steven Perez (MRN [id number]) as of 12/28/2016 12:41  Ref. Range 12/24/2016 06:50 12/24/2016 07:12  Sodium Latest Ref Range: 135 - 145 mmol/L 137   Potassium Latest Ref Range: 3.5 - 5.1 mmol/L 3.6   Chloride Latest Ref Range: 101 - 111 mmol/L 101   CO2 Latest Ref Range: 22 - 32 mmol/L 25   Glucose Latest Ref Range: 65 - 99 mg/dL 99   BUN Latest Ref Range: 6 - 20 mg/dL 60 (H)   Creatinine Latest Ref Range: 0.61 - 1.24 mg/dL 4.34 (H)   Calcium Latest Ref Range: 8.9 - 10.3 mg/dL 8.7 (L)   Anion gap Latest Ref Range: 5 - 15  11   Phosphorus Latest Ref Range: 2.5 - 4.6 mg/dL 4.7 (H)   Albumin Latest Ref Range: 3.5 - 5.0 g/dL 4.2   GFR, Est African American Latest Ref Range: >60 mL/min 15 (L)   GFR, Est Non African American Latest Ref Range: >60 mL/min 13 (L)   Iron Latest Ref Range: 45 - 182 ug/dL 58   UIBC Latest Units: ug/dL 225   TIBC Latest Ref Range: 250 - 450 ug/dL 283   Saturation Ratios Latest Ref Range: 17.9 - 39.5 % 21   Ferritin Latest Ref Range: 24 - 336 ng/mL 505 (H)   Hemoglobin Latest Ref Range: 13.0 - 17.0 g/dL  11.2 (L)  PTH, Intact Latest Ref Range: 15 - 65 pg/mL 114 (H)   PTH Interp Unknown Comment   Calcium, Total (PTH) Latest Ref Range: 8.6 - 10.2 mg/dL 9.1

## 2017-01-11 ENCOUNTER — Ambulatory Visit (HOSPITAL_COMMUNITY)
Admission: RE | Admit: 2017-01-11 | Discharge: 2017-01-11 | Disposition: A | Discharge status: Home / Self Care | Payer: Medicare Other | Source: Ambulatory Visit | Attending: Interventional Radiology | Admitting: Interventional Radiology

## 2017-01-11 DIAGNOSIS — N2889 Other specified disorders of kidney and ureter: Secondary | ICD-10-CM | POA: Diagnosis present

## 2017-01-21 ENCOUNTER — Encounter (HOSPITAL_COMMUNITY)
Admission: RE | Admit: 2017-01-21 | Discharge: 2017-01-21 | Disposition: A | Discharge status: Home / Self Care | Payer: Medicare Other | Source: Ambulatory Visit | Attending: Nephrology | Admitting: Nephrology

## 2017-01-21 DIAGNOSIS — Z79899 Other long term (current) drug therapy: Secondary | ICD-10-CM | POA: Diagnosis not present

## 2017-01-21 DIAGNOSIS — Z5181 Encounter for therapeutic drug level monitoring: Secondary | ICD-10-CM | POA: Diagnosis not present

## 2017-01-21 DIAGNOSIS — D631 Anemia in chronic kidney disease: Secondary | ICD-10-CM | POA: Insufficient documentation

## 2017-01-21 DIAGNOSIS — N183 Chronic kidney disease, stage 3 (moderate): Secondary | ICD-10-CM | POA: Diagnosis not present

## 2017-01-21 MED ORDER — EPOETIN ALFA 10000 UNIT/ML IJ SOLN
10000.0000 [IU] | Freq: Once | INTRAMUSCULAR | Status: AC
  Administered 2017-01-21: 10000 [IU] via SUBCUTANEOUS
  Filled 2017-01-21: qty 1

## 2017-01-21 NOTE — Final Progress Note (Signed)
Pt. Came for blood work & procrit injection.  Pt. Stuck for attempted blood work 8 times with no results.  Pt. Had a list of bloodwork that had been drawn at [facility] on 01/19/17.  H&H 11/33.3 on bloodwork @ visit on 01/19/17.  Procrit given as ordered based on the H&H on 01/19/17.  Spoke with Erline Levine at Dr. Serita Grit office & she said that was fine & we will try to get monthly bloodwork on next visit on 02/18/17.

## 2017-01-25 ENCOUNTER — Ambulatory Visit
Admission: RE | Admit: 2017-01-25 | Discharge: 2017-01-25 | Disposition: A | Discharge status: Home / Self Care | Payer: Medicare Other | Source: Ambulatory Visit | Attending: Interventional Radiology | Admitting: Interventional Radiology

## 2017-01-25 DIAGNOSIS — N2889 Other specified disorders of kidney and ureter: Secondary | ICD-10-CM

## 2017-01-25 HISTORY — PX: IR RADIOLOGIST EVAL & MGMT: IMG5224

## 2017-01-25 NOTE — Progress Notes (Signed)
Chief Complaint: Follow up of complex cystic lesion of left kidney.  History of Present Illness: Steven Perez is a 66 y.o. male originally referred by Dr. 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  This has been followed by MRI and has been stable. Contrast could not be administered due to significant chronic kidney disease. He is status post placement of a left brachiocephalic AV fistula on 09/05/2070 by Dr. Ruta Hinds. He has not yet started dialysis but is now in stage V of chronic kidney disease. He has been evaluated at [facility] for renal transplantation and has been told that he will soon be moving to the active transplant list. Recent MRI was performed in follow-up on 01/11/2017 and Steven Perez returns for review of the imaging.  Past Medical History:  Diagnosis Date  . Acute on chronic renal insufficiency   . Anemia    assoc w/ chronic reanl failure  . Arthralgia   . Chronic kidney disease    Stage 4  . Diabetes mellitus   . Dyslipidemia   . Erectile dysfunction due to arterial insufficiency   . HLD (hyperlipidemia)   . Hypertension   . Insomnia   . Left renal mass   . Obesity   . Secondary hyperparathyroidism of renal origin ([place])   . Type 2 diabetes mellitus ([place])     Past Surgical History:  Procedure Laterality Date  . AV FISTULA PLACEMENT Left 11/02/2016   Procedure: ARTERIOVENOUS (AV) FISTULA CREATION LEFT UPPER ARM;  Surgeon: Elam Dutch, MD;  Location: [place];  Service: Vascular;  Laterality: Left;  . IR GENERIC HISTORICAL  06/04/2014   IR RADIOLOGIST EVAL & MGMT 06/04/2014 Aletta Edouard, MD [facility] INTERV RAD  . IR GENERIC HISTORICAL  01/15/2016   IR RADIOLOGIST EVAL & MGMT 01/15/2016 [facility] INTERV RAD  . RENAL BIOPSY    . RENAL BIOPSY, PERCUTANEOUS    . TONSILLECTOMY      Allergies: Contrast media [iodinated diagnostic agents] and Tetanus toxoids  Medications: Prior to Admission medications     Medication Sig Start Date End Date Taking? Authorizing Provider  calcitRIOL (ROCALTROL) 0.25 MCG capsule Take 0.25 mcg by mouth daily.    [provider]  carvedilol (COREG) 25 MG tablet Take 25 mg by mouth 2 (two) times daily.    [provider]  doxazosin (CARDURA) 4 MG tablet Take 4 mg by mouth at bedtime.    [provider]  furosemide (LASIX) 40 MG tablet Take 80 mg by mouth daily.     [provider]  hydrALAZINE (APRESOLINE) 25 MG tablet Take 25 mg by mouth 2 (two) times daily.    [provider]  Insulin Degludec (TRESIBA FLEXTOUCH) 200 UNIT/ML SOPN Inject 20 Units into the skin at bedtime.    [provider]  simvastatin (ZOCOR) 40 MG tablet Take 40 mg by mouth at bedtime.     [provider]  traZODone (DESYREL) 100 MG tablet Take 100 mg by mouth at bedtime.    [provider]  verapamil (CALAN-SR) 180 MG CR tablet Take 2 tablets (360 mg total) by mouth at bedtime. 12/04/13   Janece Canterbury, MD  Vitamin D, Ergocalciferol, (DRISDOL) 50000 UNITS CAPS capsule Take 50,000 Units by mouth every Wednesday.     [provider]     Family History  Problem Relation Age of Onset  . Diabetes Mellitus II Mother   . CAD Neg Hx   .  Stroke Neg Hx     Social History   Social History  . Marital status: Married    Spouse name: N/A  . Number of children: N/A  . Years of education: N/A   Social History Main Topics  . Smoking status: Former Research scientist (life sciences)  . Smokeless tobacco: Never Used  . Alcohol use No     Comment: occasionally  . Drug use: No  . Sexual activity: Yes   Other Topics Concern  . Not on file   Social History Narrative  . No narrative on file    Review of Systems: A 12 point ROS discussed and pertinent positives are indicated in the HPI above.  All other systems are negative.  Review of Systems  Vital Signs: BP 129/62 (BP Location: Right Arm, Patient Position: Sitting, Cuff Size: Normal)    Pulse (!) 52   Temp 97.7 F (36.5 C)   Resp 16   Ht _0  (1.727 m)   SpO2 100%   Physical Exam  Imaging: Mr Abdomen Wo Contrast  Result Date: 01/11/2017 CLINICAL DATA:  Left renal cystic mass aspiration on 12/19/2014. End-stage renal disease. EXAM: MRI ABDOMEN WITHOUT CONTRAST TECHNIQUE: Multiplanar multisequence MR imaging was performed without the administration of intravenous contrast. COMPARISON:  Multiple exams, including 01/15/2016 FINDINGS: Lower chest: Unremarkable where included. Hepatobiliary: Low signal in the liver on in-phase images which can indicate hemochromatosis. There is also low signal in the spleen, and low T2 signal throughout the liver and spleen which can also be seen in hemochromatosis. Several small T2 hyperintense lesions scattered in the liver, for example a 0.9 by 0.7 cm lesion in segment 2 on image 9/8 which previously measured the same. Some of these lesions are more cephalad in location than was included on the prior exam. Subject to the limits of motion artifact, these lesions appear stable from prior. These lesions are poorly seen on the T1 weighted images. Pancreas:  Unremarkable Spleen:  Low T2 signal intensity favoring hemochromatosis. Adrenals/Urinary Tract:  Adrenal glands normal. The left mid kidney renal lesion of concern measures 2.3 by 2.3 cm on image 26/5 (formerly by my measurements of the same). And contains multiple internal septations some of which may be slightly thickened for example on image 25/5. This lesion has primarily high T2 and low T1 signal characteristics. Overall very similar appearance compared to 01/15/2016. Other fluid signal intensity lesions of both kidneys are likely cysts and similar to prior. Stomach/Bowel: Unremarkable Vascular/Lymphatic:  Unremarkable Other:  No supplemental non-categorized findings. Musculoskeletal: Hemangioma in the T11 vertebra. Thoracic and lumbar spondylosis along with degenerative disc disease. IMPRESSION: 1.  Stable appearance of 2.3 cm cystic lesion of the left mid kidney posteriorly, with multiple internal septations some of which may be slightly thickened. No significant change from prior. Based on its characteristics this is at least a Bosniak category 11F lesion. No obvious morphologic change compared to prior. An actual Bosniak classification cannot be assigned due to the lack of postcontrast imaging. Annual surveillance is likely reasonable given the lack of significant progression or morphologic change over the past year. 2. Hemochromatosis. 3. Stable small nonspecific T2 signal hyperintensities in the liver, no appreciable change from last year, likely benign but can be surveilled at the time of renal follow up. Electronically Signed   By: Van Clines M.D.   On: 01/11/2017 15:46    Labs:  CBC:  Recent Labs  02/20/16 9373  04/16/16 0708  10/29/16 4287 11/02/16 6811 11/26/16 5726 12/24/16 2035  HGB 11.2*  < > 11.9*  < > 12.1* 11.9* 10.9* 11.2*  HCT 33.0*  --  34.7*  --   --  35.0*  --   --   < > = values in this interval not displayed.  COAGS: No results for input(s): INR, APTT in the last 8760 hours.  BMP:  Recent Labs  09/03/16 0703 10/01/16 0724 10/29/16 0730 11/02/16 0637 12/24/16 0650  NA 136 137 137 140 137  K 3.4* 3.5 3.6 3.7 3.6  CL 100* 101 100*  --  101  CO2 _0 --  25  GLUCOSE 121* 99 115* 115* 99  BUN 53* 48* 51*  --  60*  CALCIUM 8.8* 8.7*  8.7 8.9  9.1  --  8.7*  9.1  CREATININE 4.08* 4.39* 4.34*  --  4.34*  GFRNONAA 14* 13* 13*  --  13*  GFRAA 16* 15* 15*  --  15*    LIVER FUNCTION TESTS:  Recent Labs  09/03/16 0703 10/01/16 0724 10/29/16 0730 12/24/16 0650  ALBUMIN 4.1 3.9 4.0 4.2    Assessment and Plan:  I met with Steven Perez and reviewed the recent MRI with him. This shows essentially stable appearance of a complex cyst of the posterior left interpolar kidney demonstrating partial internal septation without mural nodularity.  Cystic component may be marginally larger compared to 2015. However, the internal septation is without significant change. This has the appearance of a Bosniak 65F lesion and is likely benign given stability over 3 years. I told Steven Perez that this likely would not require further imaging for at least 2 years and may be able to be followed by ultrasound rather than MRI. Further imaging may also have to be dictated by the transplant service at [facility] if stability is necessary to be documented in the future.   Electronically SignedAletta Edouard T 01/25/2017, 8:37 AM   I spent a total of 15 Minutes in face to face in clinical consultation, greater than 50% of which was counseling/coordinating care for a left renal cystic lesion.

## 2017-02-01 ENCOUNTER — Ambulatory Visit (INDEPENDENT_AMBULATORY_CARE_PROVIDER_SITE_OTHER): Payer: Medicare Other | Admitting: Urology

## 2017-02-01 DIAGNOSIS — N5201 Erectile dysfunction due to arterial insufficiency: Secondary | ICD-10-CM | POA: Diagnosis not present

## 2017-02-01 DIAGNOSIS — N2889 Other specified disorders of kidney and ureter: Secondary | ICD-10-CM

## 2017-02-18 ENCOUNTER — Encounter (HOSPITAL_COMMUNITY)
Admission: RE | Admit: 2017-02-18 | Discharge: 2017-02-18 | Disposition: A | Discharge status: Home / Self Care | Payer: Medicare Other | Source: Ambulatory Visit | Attending: Nephrology | Admitting: Nephrology

## 2017-02-18 ENCOUNTER — Encounter (HOSPITAL_COMMUNITY): Payer: Self-pay

## 2017-02-18 DIAGNOSIS — N183 Chronic kidney disease, stage 3 (moderate): Secondary | ICD-10-CM | POA: Insufficient documentation

## 2017-02-18 DIAGNOSIS — D631 Anemia in chronic kidney disease: Secondary | ICD-10-CM | POA: Diagnosis not present

## 2017-02-18 LAB — RENAL FUNCTION PANEL
ALBUMIN: 4.2 g/dL (ref 3.5–5.0)
ANION GAP: 12 (ref 5–15)
BUN: 52 mg/dL — ABNORMAL HIGH (ref 6–20)
CALCIUM: 8.9 mg/dL (ref 8.9–10.3)
CHLORIDE: 101 mmol/L (ref 101–111)
CO2: 24 mmol/L (ref 22–32)
CREATININE: 4.57 mg/dL — AB (ref 0.61–1.24)
GFR, EST AFRICAN AMERICAN: 14 mL/min — AB (ref >60)
GFR, EST NON AFRICAN AMERICAN: 12 mL/min — AB (ref >60)
Glucose, Bld: 80 mg/dL (ref 65–99)
Phosphorus: 4.6 mg/dL (ref 2.5–4.6)
Potassium: 3.2 mmol/L — ABNORMAL LOW (ref 3.5–5.1)
SODIUM: 137 mmol/L (ref 135–145)

## 2017-02-18 LAB — POCT HEMOGLOBIN-HEMACUE: Hemoglobin: 11.2 g/dL — ABNORMAL LOW (ref 13.0–17.0)

## 2017-02-18 LAB — FERRITIN: FERRITIN: 394 ng/mL — AB (ref 24–336)

## 2017-02-18 LAB — IRON AND TIBC
IRON: 54 ug/dL (ref 45–182)
Saturation Ratios: 19 % (ref 17.9–39.5)
TIBC: 283 ug/dL (ref 250–450)
UIBC: 229 ug/dL

## 2017-02-18 MED ORDER — EPOETIN ALFA 10000 UNIT/ML IJ SOLN
10000.0000 [IU] | Freq: Once | INTRAMUSCULAR | Status: AC
  Administered 2017-02-18: 10000 [IU] via SUBCUTANEOUS

## 2017-02-18 MED ORDER — EPOETIN ALFA 10000 UNIT/ML IJ SOLN
INTRAMUSCULAR | Status: AC
  Filled 2017-02-18: qty 1

## 2017-03-14 ENCOUNTER — Encounter: Payer: Self-pay | Admitting: Interventional Radiology

## 2017-03-18 ENCOUNTER — Encounter (HOSPITAL_COMMUNITY)
Admission: RE | Admit: 2017-03-18 | Discharge: 2017-03-18 | Disposition: A | Discharge status: Home / Self Care | Payer: Medicare Other | Source: Ambulatory Visit | Attending: Nephrology | Admitting: Nephrology

## 2017-03-18 DIAGNOSIS — N183 Chronic kidney disease, stage 3 (moderate): Secondary | ICD-10-CM | POA: Insufficient documentation

## 2017-03-18 DIAGNOSIS — D631 Anemia in chronic kidney disease: Secondary | ICD-10-CM | POA: Insufficient documentation

## 2017-03-18 LAB — RENAL FUNCTION PANEL
ANION GAP: 13 (ref 5–15)
Albumin: 4.2 g/dL (ref 3.5–5.0)
BUN: 50 mg/dL — ABNORMAL HIGH (ref 6–20)
CALCIUM: 9.1 mg/dL (ref 8.9–10.3)
CO2: 24 mmol/L (ref 22–32)
Chloride: 102 mmol/L (ref 101–111)
Creatinine, Ser: 4.24 mg/dL — ABNORMAL HIGH (ref 0.61–1.24)
GFR calc non Af Amer: 13 mL/min — ABNORMAL LOW (ref >60)
GFR, EST AFRICAN AMERICAN: 15 mL/min — AB (ref >60)
Glucose, Bld: 92 mg/dL (ref 65–99)
POTASSIUM: 3.4 mmol/L — AB (ref 3.5–5.1)
Phosphorus: 4.3 mg/dL (ref 2.5–4.6)
SODIUM: 139 mmol/L (ref 135–145)

## 2017-03-18 LAB — FERRITIN: Ferritin: 420 ng/mL — ABNORMAL HIGH (ref 24–336)

## 2017-03-18 LAB — IRON AND TIBC
IRON: 58 ug/dL (ref 45–182)
SATURATION RATIOS: 22 % (ref 17.9–39.5)
TIBC: 265 ug/dL (ref 250–450)
UIBC: 207 ug/dL

## 2017-03-18 LAB — POCT HEMOGLOBIN-HEMACUE: Hemoglobin: 11.7 g/dL — ABNORMAL LOW (ref 13.0–17.0)

## 2017-03-23 LAB — PTH, INTACT AND CALCIUM
CALCIUM TOTAL (PTH): 9.4 mg/dL (ref 8.6–10.2)
PTH: 80 pg/mL — AB (ref 15–65)

## 2017-04-15 ENCOUNTER — Encounter (HOSPITAL_COMMUNITY): Payer: Self-pay

## 2017-04-15 ENCOUNTER — Encounter (HOSPITAL_COMMUNITY)
Admission: RE | Admit: 2017-04-15 | Discharge: 2017-04-15 | Disposition: A | Discharge status: Home / Self Care | Payer: Medicare Other | Source: Ambulatory Visit | Attending: Nephrology | Admitting: Nephrology

## 2017-04-15 DIAGNOSIS — N183 Chronic kidney disease, stage 3 (moderate): Secondary | ICD-10-CM | POA: Insufficient documentation

## 2017-04-15 DIAGNOSIS — D631 Anemia in chronic kidney disease: Secondary | ICD-10-CM | POA: Diagnosis present

## 2017-04-15 LAB — RENAL FUNCTION PANEL
ALBUMIN: 4.4 g/dL (ref 3.5–5.0)
ANION GAP: 14 (ref 5–15)
BUN: 69 mg/dL — AB (ref 6–20)
CALCIUM: 9.6 mg/dL (ref 8.9–10.3)
CHLORIDE: 96 mmol/L — AB (ref 101–111)
CO2: 25 mmol/L (ref 22–32)
CREATININE: 4.77 mg/dL — AB (ref 0.61–1.24)
GFR calc non Af Amer: 12 mL/min — ABNORMAL LOW (ref >60)
GFR, EST AFRICAN AMERICAN: 13 mL/min — AB (ref >60)
GLUCOSE: 87 mg/dL (ref 65–99)
Phosphorus: 4.8 mg/dL — ABNORMAL HIGH (ref 2.5–4.6)
Potassium: 3.6 mmol/L (ref 3.5–5.1)
SODIUM: 135 mmol/L (ref 135–145)

## 2017-04-15 LAB — IRON AND TIBC
Iron: 65 ug/dL (ref 45–182)
Saturation Ratios: 22 % (ref 17.9–39.5)
TIBC: 290 ug/dL (ref 250–450)
UIBC: 225 ug/dL

## 2017-04-15 LAB — FERRITIN: FERRITIN: 562 ng/mL — AB (ref 24–336)

## 2017-04-15 LAB — POCT HEMOGLOBIN-HEMACUE: HEMOGLOBIN: 12.2 g/dL — AB (ref 13.0–17.0)

## 2017-04-15 MED ORDER — EPOETIN ALFA 10000 UNIT/ML IJ SOLN: 10000.0000 [IU] | Freq: Once | INTRAMUSCULAR | Status: DC

## 2017-04-16 LAB — PTH, INTACT AND CALCIUM
Calcium, Total (PTH): 9.6 mg/dL (ref 8.6–10.2)
PTH: 51 pg/mL (ref 15–65)

## 2017-04-29 ENCOUNTER — Other Ambulatory Visit (HOSPITAL_COMMUNITY): Payer: Self-pay | Admitting: Family Medicine

## 2017-04-29 ENCOUNTER — Ambulatory Visit (HOSPITAL_COMMUNITY)
Admission: RE | Admit: 2017-04-29 | Discharge: 2017-04-29 | Disposition: A | Discharge status: Home / Self Care | Payer: Medicare Other | Source: Ambulatory Visit | Attending: Family Medicine | Admitting: Family Medicine

## 2017-04-29 DIAGNOSIS — M25551 Pain in right hip: Secondary | ICD-10-CM | POA: Insufficient documentation

## 2017-05-13 ENCOUNTER — Encounter (HOSPITAL_COMMUNITY)
Admission: RE | Admit: 2017-05-13 | Discharge: 2017-05-13 | Disposition: A | Discharge status: Home / Self Care | Payer: Medicare Other | Source: Ambulatory Visit | Attending: Nephrology | Admitting: Nephrology

## 2017-05-13 ENCOUNTER — Encounter (HOSPITAL_COMMUNITY): Payer: Self-pay

## 2017-05-13 DIAGNOSIS — D631 Anemia in chronic kidney disease: Secondary | ICD-10-CM | POA: Diagnosis present

## 2017-05-13 DIAGNOSIS — N183 Chronic kidney disease, stage 3 (moderate): Secondary | ICD-10-CM | POA: Insufficient documentation

## 2017-05-13 LAB — RENAL FUNCTION PANEL
ALBUMIN: 4.4 g/dL (ref 3.5–5.0)
ANION GAP: 13 (ref 5–15)
BUN: 82 mg/dL — AB (ref 6–20)
CHLORIDE: 96 mmol/L — AB (ref 101–111)
CO2: 28 mmol/L (ref 22–32)
Calcium: 10.2 mg/dL (ref 8.9–10.3)
Creatinine, Ser: 4.51 mg/dL — ABNORMAL HIGH (ref 0.61–1.24)
GFR calc Af Amer: 14 mL/min — ABNORMAL LOW (ref >60)
GFR, EST NON AFRICAN AMERICAN: 12 mL/min — AB (ref >60)
Glucose, Bld: 125 mg/dL — ABNORMAL HIGH (ref 65–99)
PHOSPHORUS: 5.4 mg/dL — AB (ref 2.5–4.6)
POTASSIUM: 3.4 mmol/L — AB (ref 3.5–5.1)
Sodium: 137 mmol/L (ref 135–145)

## 2017-05-13 LAB — IRON AND TIBC
Iron: 75 ug/dL (ref 45–182)
SATURATION RATIOS: 26 % (ref 17.9–39.5)
TIBC: 291 ug/dL (ref 250–450)
UIBC: 216 ug/dL

## 2017-05-13 LAB — FERRITIN: FERRITIN: 573 ng/mL — AB (ref 24–336)

## 2017-05-13 LAB — POCT HEMOGLOBIN-HEMACUE: HEMOGLOBIN: 12.2 g/dL — AB (ref 13.0–17.0)

## 2017-05-13 MED ORDER — EPOETIN ALFA 10000 UNIT/ML IJ SOLN: 10000.0000 [IU] | INTRAMUSCULAR | Status: DC

## 2017-05-14 LAB — PTH, INTACT AND CALCIUM
CALCIUM TOTAL (PTH): 10.3 mg/dL — AB (ref 8.6–10.2)
PTH: 22 pg/mL (ref 15–65)

## 2017-06-10 ENCOUNTER — Encounter (HOSPITAL_COMMUNITY)
Admission: RE | Admit: 2017-06-10 | Discharge: 2017-06-10 | Disposition: A | Discharge status: Home / Self Care | Payer: Medicare Other | Source: Ambulatory Visit | Attending: Nephrology | Admitting: Nephrology

## 2017-06-10 DIAGNOSIS — N183 Chronic kidney disease, stage 3 (moderate): Secondary | ICD-10-CM | POA: Insufficient documentation

## 2017-06-10 DIAGNOSIS — D631 Anemia in chronic kidney disease: Secondary | ICD-10-CM | POA: Insufficient documentation

## 2017-06-10 LAB — IRON AND TIBC
Iron: 61 ug/dL (ref 45–182)
SATURATION RATIOS: 21 % (ref 17.9–39.5)
TIBC: 287 ug/dL (ref 250–450)
UIBC: 226 ug/dL

## 2017-06-10 LAB — RENAL FUNCTION PANEL
ANION GAP: 16 — AB (ref 5–15)
Albumin: 4.1 g/dL (ref 3.5–5.0)
BUN: 63 mg/dL — AB (ref 6–20)
CHLORIDE: 95 mmol/L — AB (ref 101–111)
CO2: 26 mmol/L (ref 22–32)
Calcium: 9.8 mg/dL (ref 8.9–10.3)
Creatinine, Ser: 4.7 mg/dL — ABNORMAL HIGH (ref 0.61–1.24)
GFR calc Af Amer: 14 mL/min — ABNORMAL LOW (ref >60)
GFR calc non Af Amer: 12 mL/min — ABNORMAL LOW (ref >60)
GLUCOSE: 80 mg/dL (ref 65–99)
POTASSIUM: 3.7 mmol/L (ref 3.5–5.1)
Phosphorus: 5.4 mg/dL — ABNORMAL HIGH (ref 2.5–4.6)
Sodium: 137 mmol/L (ref 135–145)

## 2017-06-10 LAB — FERRITIN: Ferritin: 661 ng/mL — ABNORMAL HIGH (ref 24–336)

## 2017-06-10 LAB — POCT HEMOGLOBIN-HEMACUE: HEMOGLOBIN: 11.2 g/dL — AB (ref 13.0–17.0)

## 2017-06-10 MED ORDER — EPOETIN ALFA 10000 UNIT/ML IJ SOLN
10000.0000 [IU] | Freq: Once | INTRAMUSCULAR | Status: AC
  Administered 2017-06-10: 10000 [IU] via SUBCUTANEOUS

## 2017-06-10 MED ORDER — EPOETIN ALFA 10000 UNIT/ML IJ SOLN
INTRAMUSCULAR | Status: AC
  Filled 2017-06-10: qty 1

## 2017-06-11 LAB — PTH, INTACT AND CALCIUM
CALCIUM TOTAL (PTH): 9.9 mg/dL (ref 8.6–10.2)
PTH: 39 pg/mL (ref 15–65)

## 2017-07-08 ENCOUNTER — Encounter (HOSPITAL_COMMUNITY): Payer: Self-pay

## 2017-07-08 ENCOUNTER — Encounter (HOSPITAL_COMMUNITY)
Admission: RE | Admit: 2017-07-08 | Discharge: 2017-07-08 | Disposition: A | Discharge status: Home / Self Care | Payer: Medicare Other | Source: Ambulatory Visit | Attending: Nephrology | Admitting: Nephrology

## 2017-07-08 DIAGNOSIS — N183 Chronic kidney disease, stage 3 (moderate): Secondary | ICD-10-CM | POA: Insufficient documentation

## 2017-07-08 DIAGNOSIS — D631 Anemia in chronic kidney disease: Secondary | ICD-10-CM | POA: Diagnosis present

## 2017-07-08 LAB — IRON AND TIBC
IRON: 60 ug/dL (ref 45–182)
SATURATION RATIOS: 23 % (ref 17.9–39.5)
TIBC: 260 ug/dL (ref 250–450)
UIBC: 200 ug/dL

## 2017-07-08 LAB — RENAL FUNCTION PANEL
ANION GAP: 15 (ref 5–15)
Albumin: 3.8 g/dL (ref 3.5–5.0)
BUN: 53 mg/dL — ABNORMAL HIGH (ref 6–20)
CALCIUM: 9.4 mg/dL (ref 8.9–10.3)
CHLORIDE: 98 mmol/L — AB (ref 101–111)
CO2: 24 mmol/L (ref 22–32)
Creatinine, Ser: 4.61 mg/dL — ABNORMAL HIGH (ref 0.61–1.24)
GFR calc non Af Amer: 12 mL/min — ABNORMAL LOW (ref >60)
GFR, EST AFRICAN AMERICAN: 14 mL/min — AB (ref >60)
GLUCOSE: 56 mg/dL — AB (ref 65–99)
POTASSIUM: 3.3 mmol/L — AB (ref 3.5–5.1)
Phosphorus: 4.3 mg/dL (ref 2.5–4.6)
SODIUM: 137 mmol/L (ref 135–145)

## 2017-07-08 LAB — POCT HEMOGLOBIN-HEMACUE: HEMOGLOBIN: 10.2 g/dL — AB (ref 13.0–17.0)

## 2017-07-08 LAB — FERRITIN: Ferritin: 574 ng/mL — ABNORMAL HIGH (ref 24–336)

## 2017-07-08 MED ORDER — EPOETIN ALFA 10000 UNIT/ML IJ SOLN
10000.0000 [IU] | INTRAMUSCULAR | Status: DC
  Administered 2017-07-08: 10000 [IU] via SUBCUTANEOUS

## 2017-07-08 MED ORDER — EPOETIN ALFA 10000 UNIT/ML IJ SOLN
INTRAMUSCULAR | Status: AC
Start: 2017-07-08 — End: 2017-07-08
  Filled 2017-07-08: qty 1

## 2017-07-09 LAB — PTH, INTACT AND CALCIUM
Calcium, Total (PTH): 9.6 mg/dL (ref 8.6–10.2)
PTH: 38 pg/mL (ref 15–65)

## 2017-08-12 ENCOUNTER — Encounter (HOSPITAL_COMMUNITY)
Admission: RE | Admit: 2017-08-12 | Discharge: 2017-08-12 | Disposition: A | Discharge status: Home / Self Care | Payer: Medicare Other | Source: Ambulatory Visit | Attending: Nephrology | Admitting: Nephrology

## 2017-08-12 DIAGNOSIS — N183 Chronic kidney disease, stage 3 (moderate): Secondary | ICD-10-CM | POA: Insufficient documentation

## 2017-08-12 DIAGNOSIS — D631 Anemia in chronic kidney disease: Secondary | ICD-10-CM | POA: Diagnosis not present

## 2017-08-12 LAB — RENAL FUNCTION PANEL
Albumin: 3.9 g/dL (ref 3.5–5.0)
Anion gap: 15 (ref 5–15)
BUN: 65 mg/dL — ABNORMAL HIGH (ref 6–20)
CHLORIDE: 99 mmol/L — AB (ref 101–111)
CO2: 24 mmol/L (ref 22–32)
Calcium: 10.3 mg/dL (ref 8.9–10.3)
Creatinine, Ser: 4.56 mg/dL — ABNORMAL HIGH (ref 0.61–1.24)
GFR, EST AFRICAN AMERICAN: 14 mL/min — AB (ref >60)
GFR, EST NON AFRICAN AMERICAN: 12 mL/min — AB (ref >60)
Glucose, Bld: 103 mg/dL — ABNORMAL HIGH (ref 65–99)
POTASSIUM: 3.8 mmol/L (ref 3.5–5.1)
Phosphorus: 4.8 mg/dL — ABNORMAL HIGH (ref 2.5–4.6)
Sodium: 138 mmol/L (ref 135–145)

## 2017-08-12 LAB — IRON AND TIBC
Iron: 69 ug/dL (ref 45–182)
Saturation Ratios: 25 % (ref 17.9–39.5)
TIBC: 280 ug/dL (ref 250–450)
UIBC: 211 ug/dL

## 2017-08-12 LAB — FERRITIN: Ferritin: 698 ng/mL — ABNORMAL HIGH (ref 24–336)

## 2017-08-12 LAB — POCT HEMOGLOBIN-HEMACUE: Hemoglobin: 11.1 g/dL — ABNORMAL LOW (ref 13.0–17.0)

## 2017-08-12 MED ORDER — EPOETIN ALFA 10000 UNIT/ML IJ SOLN
10000.0000 [IU] | INTRAMUSCULAR | Status: DC
  Administered 2017-08-12: 10000 [IU] via SUBCUTANEOUS

## 2017-08-12 MED ORDER — EPOETIN ALFA 10000 UNIT/ML IJ SOLN
INTRAMUSCULAR | Status: AC
  Filled 2017-08-12: qty 1

## 2017-08-13 LAB — PTH, INTACT AND CALCIUM
CALCIUM TOTAL (PTH): 10.2 mg/dL (ref 8.6–10.2)
PTH: 13 pg/mL — AB (ref 15–65)

## 2017-09-09 ENCOUNTER — Encounter (HOSPITAL_COMMUNITY)
Admission: RE | Admit: 2017-09-09 | Discharge: 2017-09-09 | Disposition: A | Discharge status: Home / Self Care | Payer: Medicare Other | Source: Ambulatory Visit | Attending: Nephrology | Admitting: Nephrology

## 2017-09-09 DIAGNOSIS — N183 Chronic kidney disease, stage 3 (moderate): Secondary | ICD-10-CM | POA: Diagnosis not present

## 2017-09-09 DIAGNOSIS — D631 Anemia in chronic kidney disease: Secondary | ICD-10-CM | POA: Insufficient documentation

## 2017-09-09 LAB — RENAL FUNCTION PANEL
Albumin: 3.8 g/dL (ref 3.5–5.0)
Anion gap: 14 (ref 5–15)
BUN: 61 mg/dL — AB (ref 6–20)
CALCIUM: 9.9 mg/dL (ref 8.9–10.3)
CO2: 26 mmol/L (ref 22–32)
CREATININE: 4.8 mg/dL — AB (ref 0.61–1.24)
Chloride: 99 mmol/L — ABNORMAL LOW (ref 101–111)
GFR calc Af Amer: 13 mL/min — ABNORMAL LOW (ref >60)
GFR calc non Af Amer: 11 mL/min — ABNORMAL LOW (ref >60)
GLUCOSE: 78 mg/dL (ref 65–99)
Phosphorus: 4.9 mg/dL — ABNORMAL HIGH (ref 2.5–4.6)
Potassium: 3.3 mmol/L — ABNORMAL LOW (ref 3.5–5.1)
SODIUM: 139 mmol/L (ref 135–145)

## 2017-09-09 LAB — IRON AND TIBC
Iron: 57 ug/dL (ref 45–182)
Saturation Ratios: 22 % (ref 17.9–39.5)
TIBC: 259 ug/dL (ref 250–450)
UIBC: 202 ug/dL

## 2017-09-09 LAB — FERRITIN: Ferritin: 424 ng/mL — ABNORMAL HIGH (ref 24–336)

## 2017-09-09 LAB — POCT HEMOGLOBIN-HEMACUE: HEMOGLOBIN: 10.8 g/dL — AB (ref 13.0–17.0)

## 2017-09-09 MED ORDER — EPOETIN ALFA 10000 UNIT/ML IJ SOLN
10000.0000 [IU] | Freq: Once | INTRAMUSCULAR | Status: AC
  Administered 2017-09-09: 10000 [IU] via SUBCUTANEOUS

## 2017-09-09 MED ORDER — EPOETIN ALFA 10000 UNIT/ML IJ SOLN
INTRAMUSCULAR | Status: AC
  Filled 2017-09-09: qty 1

## 2017-09-10 LAB — PTH, INTACT AND CALCIUM
CALCIUM TOTAL (PTH): 10 mg/dL (ref 8.6–10.2)
PTH: 20 pg/mL (ref 15–65)

## 2017-10-07 ENCOUNTER — Encounter (HOSPITAL_COMMUNITY)
Admission: RE | Admit: 2017-10-07 | Discharge: 2017-10-07 | Disposition: A | Discharge status: Home / Self Care | Payer: Medicare Other | Source: Ambulatory Visit | Attending: Nephrology | Admitting: Nephrology

## 2017-10-07 DIAGNOSIS — N183 Chronic kidney disease, stage 3 (moderate): Secondary | ICD-10-CM | POA: Insufficient documentation

## 2017-10-07 DIAGNOSIS — D631 Anemia in chronic kidney disease: Secondary | ICD-10-CM | POA: Insufficient documentation

## 2017-10-07 LAB — IRON AND TIBC
Iron: 45 ug/dL (ref 45–182)
Saturation Ratios: 17 % — ABNORMAL LOW (ref 17.9–39.5)
TIBC: 269 ug/dL (ref 250–450)
UIBC: 224 ug/dL

## 2017-10-07 LAB — RENAL FUNCTION PANEL
ALBUMIN: 4 g/dL (ref 3.5–5.0)
ANION GAP: 13 (ref 5–15)
BUN: 67 mg/dL — ABNORMAL HIGH (ref 6–20)
CALCIUM: 9.9 mg/dL (ref 8.9–10.3)
CO2: 26 mmol/L (ref 22–32)
Chloride: 99 mmol/L — ABNORMAL LOW (ref 101–111)
Creatinine, Ser: 5.13 mg/dL — ABNORMAL HIGH (ref 0.61–1.24)
GFR calc non Af Amer: 11 mL/min — ABNORMAL LOW (ref >60)
GFR, EST AFRICAN AMERICAN: 12 mL/min — AB (ref >60)
Glucose, Bld: 87 mg/dL (ref 65–99)
PHOSPHORUS: 5.4 mg/dL — AB (ref 2.5–4.6)
POTASSIUM: 3.6 mmol/L (ref 3.5–5.1)
SODIUM: 138 mmol/L (ref 135–145)

## 2017-10-07 LAB — POCT HEMOGLOBIN-HEMACUE: HEMOGLOBIN: 11.6 g/dL — AB (ref 13.0–17.0)

## 2017-10-07 LAB — FERRITIN: Ferritin: 465 ng/mL — ABNORMAL HIGH (ref 24–336)

## 2017-11-04 ENCOUNTER — Encounter (HOSPITAL_COMMUNITY)
Admission: RE | Admit: 2017-11-04 | Discharge: 2017-11-04 | Disposition: A | Discharge status: Home / Self Care | Payer: Medicare Other | Source: Ambulatory Visit | Attending: Nephrology | Admitting: Nephrology

## 2017-11-04 ENCOUNTER — Encounter (HOSPITAL_COMMUNITY): Payer: Self-pay

## 2017-11-04 DIAGNOSIS — N183 Chronic kidney disease, stage 3 (moderate): Secondary | ICD-10-CM | POA: Diagnosis present

## 2017-11-04 DIAGNOSIS — D631 Anemia in chronic kidney disease: Secondary | ICD-10-CM | POA: Insufficient documentation

## 2017-11-04 LAB — RENAL FUNCTION PANEL
ALBUMIN: 4 g/dL (ref 3.5–5.0)
ANION GAP: 11 (ref 5–15)
BUN: 51 mg/dL — ABNORMAL HIGH (ref 6–20)
CALCIUM: 9.7 mg/dL (ref 8.9–10.3)
CO2: 27 mmol/L (ref 22–32)
CREATININE: 5.23 mg/dL — AB (ref 0.61–1.24)
Chloride: 100 mmol/L — ABNORMAL LOW (ref 101–111)
GFR, EST AFRICAN AMERICAN: 12 mL/min — AB (ref >60)
GFR, EST NON AFRICAN AMERICAN: 10 mL/min — AB (ref >60)
Glucose, Bld: 90 mg/dL (ref 65–99)
PHOSPHORUS: 4.4 mg/dL (ref 2.5–4.6)
Potassium: 3.3 mmol/L — ABNORMAL LOW (ref 3.5–5.1)
SODIUM: 138 mmol/L (ref 135–145)

## 2017-11-04 LAB — IRON AND TIBC
IRON: 63 ug/dL (ref 45–182)
SATURATION RATIOS: 25 % (ref 17.9–39.5)
TIBC: 252 ug/dL (ref 250–450)
UIBC: 189 ug/dL

## 2017-11-04 LAB — POCT HEMOGLOBIN-HEMACUE: HEMOGLOBIN: 10.9 g/dL — AB (ref 13.0–17.0)

## 2017-11-04 LAB — FERRITIN: Ferritin: 491 ng/mL — ABNORMAL HIGH (ref 24–336)

## 2017-11-04 MED ORDER — EPOETIN ALFA 10000 UNIT/ML IJ SOLN
10000.0000 [IU] | INTRAMUSCULAR | Status: DC
  Administered 2017-11-04: 10000 [IU] via SUBCUTANEOUS

## 2017-11-04 MED ORDER — EPOETIN ALFA 10000 UNIT/ML IJ SOLN
INTRAMUSCULAR | Status: AC
  Filled 2017-11-04: qty 1

## 2017-11-05 LAB — PTH, INTACT AND CALCIUM
CALCIUM TOTAL (PTH): 9.9 mg/dL (ref 8.6–10.2)
PTH: 20 pg/mL (ref 15–65)

## 2017-12-02 ENCOUNTER — Encounter (HOSPITAL_COMMUNITY)
Admission: RE | Admit: 2017-12-02 | Discharge: 2017-12-02 | Disposition: A | Discharge status: Home / Self Care | Payer: Medicare Other | Source: Ambulatory Visit | Attending: Nephrology | Admitting: Nephrology

## 2017-12-02 DIAGNOSIS — N183 Chronic kidney disease, stage 3 (moderate): Secondary | ICD-10-CM | POA: Diagnosis not present

## 2017-12-02 DIAGNOSIS — D631 Anemia in chronic kidney disease: Secondary | ICD-10-CM | POA: Insufficient documentation

## 2017-12-02 LAB — RENAL FUNCTION PANEL
ALBUMIN: 3.8 g/dL (ref 3.5–5.0)
ANION GAP: 10 (ref 5–15)
BUN: 55 mg/dL — ABNORMAL HIGH (ref 8–23)
CALCIUM: 9.2 mg/dL (ref 8.9–10.3)
CO2: 26 mmol/L (ref 22–32)
CREATININE: 5.56 mg/dL — AB (ref 0.61–1.24)
Chloride: 104 mmol/L (ref 98–111)
GFR calc non Af Amer: 10 mL/min — ABNORMAL LOW (ref >60)
GFR, EST AFRICAN AMERICAN: 11 mL/min — AB (ref >60)
Glucose, Bld: 90 mg/dL (ref 70–99)
POTASSIUM: 3.9 mmol/L (ref 3.5–5.1)
Phosphorus: 4.9 mg/dL — ABNORMAL HIGH (ref 2.5–4.6)
SODIUM: 140 mmol/L (ref 135–145)

## 2017-12-02 LAB — FERRITIN: Ferritin: 391 ng/mL — ABNORMAL HIGH (ref 24–336)

## 2017-12-02 LAB — POCT HEMOGLOBIN-HEMACUE: HEMOGLOBIN: 10.5 g/dL — AB (ref 13.0–17.0)

## 2017-12-02 LAB — IRON AND TIBC
Iron: 49 ug/dL (ref 45–182)
Saturation Ratios: 18 % (ref 17.9–39.5)
TIBC: 266 ug/dL (ref 250–450)
UIBC: 217 ug/dL

## 2017-12-02 MED ORDER — EPOETIN ALFA 10000 UNIT/ML IJ SOLN
10000.0000 [IU] | Freq: Once | INTRAMUSCULAR | Status: AC
  Administered 2017-12-02: 10000 [IU] via SUBCUTANEOUS

## 2017-12-02 MED ORDER — EPOETIN ALFA 10000 UNIT/ML IJ SOLN
INTRAMUSCULAR | Status: AC
  Filled 2017-12-02: qty 1

## 2017-12-03 LAB — PTH, INTACT AND CALCIUM
CALCIUM TOTAL (PTH): 9.4 mg/dL (ref 8.6–10.2)
PTH: 31 pg/mL (ref 15–65)

## 2017-12-30 ENCOUNTER — Encounter (HOSPITAL_COMMUNITY): Payer: Self-pay

## 2017-12-30 ENCOUNTER — Encounter (HOSPITAL_COMMUNITY)
Admission: RE | Admit: 2017-12-30 | Discharge: 2017-12-30 | Disposition: A | Discharge status: Home / Self Care | Payer: Medicare Other | Source: Ambulatory Visit | Attending: Nephrology | Admitting: Nephrology

## 2017-12-30 DIAGNOSIS — D631 Anemia in chronic kidney disease: Secondary | ICD-10-CM | POA: Insufficient documentation

## 2017-12-30 DIAGNOSIS — N183 Chronic kidney disease, stage 3 (moderate): Secondary | ICD-10-CM | POA: Diagnosis not present

## 2017-12-30 LAB — FERRITIN: Ferritin: 451 ng/mL — ABNORMAL HIGH (ref 24–336)

## 2017-12-30 LAB — IRON AND TIBC
Iron: 68 ug/dL (ref 45–182)
SATURATION RATIOS: 25 % (ref 17.9–39.5)
TIBC: 270 ug/dL (ref 250–450)
UIBC: 202 ug/dL

## 2017-12-30 LAB — RENAL FUNCTION PANEL
Albumin: 4 g/dL (ref 3.5–5.0)
Anion gap: 12 (ref 5–15)
BUN: 65 mg/dL — AB (ref 8–23)
CHLORIDE: 102 mmol/L (ref 98–111)
CO2: 26 mmol/L (ref 22–32)
Calcium: 10.2 mg/dL (ref 8.9–10.3)
Creatinine, Ser: 6.19 mg/dL — ABNORMAL HIGH (ref 0.61–1.24)
GFR calc non Af Amer: 8 mL/min — ABNORMAL LOW (ref >60)
GFR, EST AFRICAN AMERICAN: 10 mL/min — AB (ref >60)
GLUCOSE: 85 mg/dL (ref 70–99)
PHOSPHORUS: 5.7 mg/dL — AB (ref 2.5–4.6)
POTASSIUM: 4.1 mmol/L (ref 3.5–5.1)
Sodium: 140 mmol/L (ref 135–145)

## 2017-12-30 LAB — POCT HEMOGLOBIN-HEMACUE: Hemoglobin: 11.1 g/dL — ABNORMAL LOW (ref 13.0–17.0)

## 2017-12-30 MED ORDER — EPOETIN ALFA 10000 UNIT/ML IJ SOLN
10000.0000 [IU] | Freq: Once | INTRAMUSCULAR | Status: AC
  Administered 2017-12-30: 10000 [IU] via SUBCUTANEOUS

## 2017-12-30 MED ORDER — EPOETIN ALFA 10000 UNIT/ML IJ SOLN
INTRAMUSCULAR | Status: AC
  Filled 2017-12-30: qty 1

## 2017-12-31 LAB — PTH, INTACT AND CALCIUM
Calcium, Total (PTH): 10.2 mg/dL (ref 8.6–10.2)
PTH: 14 pg/mL — ABNORMAL LOW (ref 15–65)

## 2018-01-27 ENCOUNTER — Encounter (HOSPITAL_COMMUNITY): Payer: Self-pay

## 2018-01-27 ENCOUNTER — Encounter (HOSPITAL_COMMUNITY)
Admission: RE | Admit: 2018-01-27 | Discharge: 2018-01-27 | Disposition: A | Discharge status: Home / Self Care | Payer: Medicare Other | Source: Ambulatory Visit | Attending: Nephrology | Admitting: Nephrology

## 2018-01-27 ENCOUNTER — Ambulatory Visit (HOSPITAL_COMMUNITY): Payer: Medicare Other

## 2018-01-27 ENCOUNTER — Inpatient Hospital Stay (HOSPITAL_COMMUNITY): Admission: RE | Admit: 2018-01-27 | Payer: Medicare Other | Source: Ambulatory Visit

## 2018-01-27 DIAGNOSIS — N183 Chronic kidney disease, stage 3 (moderate): Secondary | ICD-10-CM | POA: Diagnosis not present

## 2018-01-27 LAB — RENAL FUNCTION PANEL
ALBUMIN: 4.2 g/dL (ref 3.5–5.0)
ANION GAP: 11 (ref 5–15)
BUN: 52 mg/dL — AB (ref 8–23)
CO2: 27 mmol/L (ref 22–32)
Calcium: 10.1 mg/dL (ref 8.9–10.3)
Chloride: 101 mmol/L (ref 98–111)
Creatinine, Ser: 6.06 mg/dL — ABNORMAL HIGH (ref 0.61–1.24)
GFR calc Af Amer: 10 mL/min — ABNORMAL LOW (ref >60)
GFR, EST NON AFRICAN AMERICAN: 9 mL/min — AB (ref >60)
Glucose, Bld: 72 mg/dL (ref 70–99)
PHOSPHORUS: 4.8 mg/dL — AB (ref 2.5–4.6)
POTASSIUM: 4 mmol/L (ref 3.5–5.1)
Sodium: 139 mmol/L (ref 135–145)

## 2018-01-27 LAB — IRON AND TIBC
Iron: 50 ug/dL (ref 45–182)
SATURATION RATIOS: 18 % (ref 17.9–39.5)
TIBC: 277 ug/dL (ref 250–450)
UIBC: 227 ug/dL

## 2018-01-27 LAB — FERRITIN: FERRITIN: 421 ng/mL — AB (ref 24–336)

## 2018-01-27 LAB — POCT HEMOGLOBIN-HEMACUE: Hemoglobin: 11 g/dL — ABNORMAL LOW (ref 13.0–17.0)

## 2018-01-27 MED ORDER — EPOETIN ALFA 10000 UNIT/ML IJ SOLN
INTRAMUSCULAR | Status: AC
  Filled 2018-01-27: qty 1

## 2018-01-27 MED ORDER — EPOETIN ALFA 10000 UNIT/ML IJ SOLN
10000.0000 [IU] | Freq: Once | INTRAMUSCULAR | Status: AC
  Administered 2018-01-27: 10000 [IU] via SUBCUTANEOUS

## 2018-01-28 LAB — PTH, INTACT AND CALCIUM
CALCIUM TOTAL (PTH): 10.1 mg/dL (ref 8.6–10.2)
PTH: 14 pg/mL — AB (ref 15–65)

## 2018-02-23 MED ORDER — SODIUM CHLORIDE 0.9 % IV SOLN
510.0000 mg | Freq: Once | INTRAVENOUS | Status: AC
  Administered 2018-02-24: 510 mg via INTRAVENOUS
  Filled 2018-02-23 (×2): qty 17

## 2018-02-24 ENCOUNTER — Encounter (HOSPITAL_COMMUNITY)
Admission: RE | Admit: 2018-02-24 | Discharge: 2018-02-24 | Disposition: A | Discharge status: Home / Self Care | Payer: Medicare Other | Source: Ambulatory Visit | Attending: Nephrology | Admitting: Nephrology

## 2018-02-24 DIAGNOSIS — D631 Anemia in chronic kidney disease: Secondary | ICD-10-CM | POA: Insufficient documentation

## 2018-02-24 DIAGNOSIS — N183 Chronic kidney disease, stage 3 (moderate): Secondary | ICD-10-CM | POA: Diagnosis present

## 2018-02-24 LAB — RENAL FUNCTION PANEL
Albumin: 4.1 g/dL (ref 3.5–5.0)
Anion gap: 11 (ref 5–15)
BUN: 48 mg/dL — ABNORMAL HIGH (ref 8–23)
CALCIUM: 9 mg/dL (ref 8.9–10.3)
CO2: 26 mmol/L (ref 22–32)
CREATININE: 5.38 mg/dL — AB (ref 0.61–1.24)
Chloride: 99 mmol/L (ref 98–111)
GFR calc Af Amer: 11 mL/min — ABNORMAL LOW (ref >60)
GFR calc non Af Amer: 10 mL/min — ABNORMAL LOW (ref >60)
GLUCOSE: 87 mg/dL (ref 70–99)
Phosphorus: 4.5 mg/dL (ref 2.5–4.6)
Potassium: 4.1 mmol/L (ref 3.5–5.1)
SODIUM: 136 mmol/L (ref 135–145)

## 2018-02-24 LAB — POCT HEMOGLOBIN-HEMACUE: HEMOGLOBIN: 11 g/dL — AB (ref 13.0–17.0)

## 2018-02-24 LAB — IRON AND TIBC
Iron: 54 ug/dL (ref 45–182)
Saturation Ratios: 19 % (ref 17.9–39.5)
TIBC: 278 ug/dL (ref 250–450)
UIBC: 224 ug/dL

## 2018-02-24 LAB — FERRITIN: Ferritin: 402 ng/mL — ABNORMAL HIGH (ref 24–336)

## 2018-02-24 MED ORDER — EPOETIN ALFA 10000 UNIT/ML IJ SOLN
10000.0000 [IU] | Freq: Once | INTRAMUSCULAR | Status: AC
  Administered 2018-02-24: 10000 [IU] via SUBCUTANEOUS
  Filled 2018-02-24: qty 1

## 2018-02-24 MED ORDER — SODIUM CHLORIDE 0.9 % IV SOLN
Freq: Once | INTRAVENOUS | Status: AC
  Administered 2018-02-24: 08:00:00 via INTRAVENOUS

## 2018-02-25 LAB — PTH, INTACT AND CALCIUM
Calcium, Total (PTH): 9.2 mg/dL (ref 8.6–10.2)
PTH: 36 pg/mL (ref 15–65)

## 2018-03-03 ENCOUNTER — Encounter (HOSPITAL_COMMUNITY)
Admission: RE | Admit: 2018-03-03 | Discharge: 2018-03-03 | Disposition: A | Discharge status: Home / Self Care | Payer: Medicare Other | Source: Ambulatory Visit | Attending: Nephrology | Admitting: Nephrology

## 2018-03-03 DIAGNOSIS — D631 Anemia in chronic kidney disease: Secondary | ICD-10-CM | POA: Insufficient documentation

## 2018-03-03 DIAGNOSIS — N183 Chronic kidney disease, stage 3 (moderate): Secondary | ICD-10-CM | POA: Insufficient documentation

## 2018-03-03 MED ORDER — SODIUM CHLORIDE 0.9 % IV SOLN
Freq: Once | INTRAVENOUS | Status: AC
  Administered 2018-03-03: 250 mL via INTRAVENOUS

## 2018-03-03 MED ORDER — SODIUM CHLORIDE 0.9 % IV SOLN
510.0000 mg | Freq: Once | INTRAVENOUS | Status: AC
  Administered 2018-03-03: 510 mg via INTRAVENOUS
  Filled 2018-03-03: qty 17

## 2018-03-21 ENCOUNTER — Ambulatory Visit: Payer: Medicare Other | Admitting: Urology

## 2018-03-21 DIAGNOSIS — R310 Gross hematuria: Secondary | ICD-10-CM | POA: Diagnosis not present

## 2018-03-21 DIAGNOSIS — N2889 Other specified disorders of kidney and ureter: Secondary | ICD-10-CM

## 2018-03-24 ENCOUNTER — Encounter (HOSPITAL_COMMUNITY)
Admission: RE | Admit: 2018-03-24 | Discharge: 2018-03-24 | Disposition: A | Discharge status: Home / Self Care | Payer: Medicare Other | Source: Ambulatory Visit | Attending: Nephrology | Admitting: Nephrology

## 2018-03-24 ENCOUNTER — Encounter (HOSPITAL_COMMUNITY): Payer: Self-pay

## 2018-03-24 DIAGNOSIS — N183 Chronic kidney disease, stage 3 (moderate): Secondary | ICD-10-CM | POA: Diagnosis not present

## 2018-03-24 LAB — RENAL FUNCTION PANEL
ANION GAP: 11 (ref 5–15)
Albumin: 4.1 g/dL (ref 3.5–5.0)
BUN: 53 mg/dL — ABNORMAL HIGH (ref 8–23)
CHLORIDE: 102 mmol/L (ref 98–111)
CO2: 25 mmol/L (ref 22–32)
Calcium: 8.7 mg/dL — ABNORMAL LOW (ref 8.9–10.3)
Creatinine, Ser: 5.34 mg/dL — ABNORMAL HIGH (ref 0.61–1.24)
GFR calc non Af Amer: 10 mL/min — ABNORMAL LOW (ref >60)
GFR, EST AFRICAN AMERICAN: 12 mL/min — AB (ref >60)
Glucose, Bld: 89 mg/dL (ref 70–99)
Phosphorus: 4.9 mg/dL — ABNORMAL HIGH (ref 2.5–4.6)
Potassium: 3.9 mmol/L (ref 3.5–5.1)
SODIUM: 138 mmol/L (ref 135–145)

## 2018-03-24 LAB — IRON AND TIBC
Iron: 58 ug/dL (ref 45–182)
SATURATION RATIOS: 22 % (ref 17.9–39.5)
TIBC: 263 ug/dL (ref 250–450)
UIBC: 205 ug/dL

## 2018-03-24 LAB — FERRITIN: Ferritin: 671 ng/mL — ABNORMAL HIGH (ref 24–336)

## 2018-03-24 LAB — POCT HEMOGLOBIN-HEMACUE: HEMOGLOBIN: 10.6 g/dL — AB (ref 13.0–17.0)

## 2018-03-24 MED ORDER — EPOETIN ALFA 10000 UNIT/ML IJ SOLN
10000.0000 [IU] | INTRAMUSCULAR | Status: DC
  Administered 2018-03-24: 10000 [IU] via SUBCUTANEOUS
  Filled 2018-03-24: qty 1

## 2018-03-25 LAB — PTH, INTACT AND CALCIUM
CALCIUM TOTAL (PTH): 9 mg/dL (ref 8.6–10.2)
PTH: 85 pg/mL — AB (ref 15–65)

## 2018-04-21 ENCOUNTER — Encounter (HOSPITAL_COMMUNITY)
Admission: RE | Admit: 2018-04-21 | Discharge: 2018-04-21 | Disposition: A | Discharge status: Home / Self Care | Payer: Medicare Other | Source: Ambulatory Visit | Attending: Nephrology | Admitting: Nephrology

## 2018-04-21 ENCOUNTER — Encounter (HOSPITAL_COMMUNITY): Payer: Self-pay

## 2018-04-21 DIAGNOSIS — D631 Anemia in chronic kidney disease: Secondary | ICD-10-CM | POA: Insufficient documentation

## 2018-04-21 DIAGNOSIS — N183 Chronic kidney disease, stage 3 (moderate): Secondary | ICD-10-CM | POA: Insufficient documentation

## 2018-04-21 LAB — POCT HEMOGLOBIN-HEMACUE: Hemoglobin: 10.7 g/dL — ABNORMAL LOW (ref 13.0–17.0)

## 2018-04-21 MED ORDER — EPOETIN ALFA 10000 UNIT/ML IJ SOLN
10000.0000 [IU] | Freq: Once | INTRAMUSCULAR | Status: AC
  Administered 2018-04-21: 10000 [IU] via SUBCUTANEOUS

## 2018-04-21 MED ORDER — EPOETIN ALFA 10000 UNIT/ML IJ SOLN
INTRAMUSCULAR | Status: AC
  Filled 2018-04-21: qty 1

## 2018-05-19 ENCOUNTER — Encounter (HOSPITAL_COMMUNITY)
Admission: RE | Admit: 2018-05-19 | Discharge: 2018-05-19 | Disposition: A | Discharge status: Home / Self Care | Payer: Medicare Other | Source: Ambulatory Visit | Attending: Nephrology | Admitting: Nephrology

## 2018-05-19 DIAGNOSIS — N183 Chronic kidney disease, stage 3 (moderate): Secondary | ICD-10-CM | POA: Diagnosis not present

## 2018-05-19 DIAGNOSIS — D631 Anemia in chronic kidney disease: Secondary | ICD-10-CM | POA: Diagnosis not present

## 2018-05-19 LAB — IRON AND TIBC
Iron: 56 ug/dL (ref 45–182)
Saturation Ratios: 24 % (ref 17.9–39.5)
TIBC: 229 ug/dL — AB (ref 250–450)
UIBC: 173 ug/dL

## 2018-05-19 LAB — RENAL FUNCTION PANEL
ANION GAP: 13 (ref 5–15)
Albumin: 3.9 g/dL (ref 3.5–5.0)
BUN: 43 mg/dL — ABNORMAL HIGH (ref 8–23)
CALCIUM: 8.8 mg/dL — AB (ref 8.9–10.3)
CO2: 23 mmol/L (ref 22–32)
Chloride: 103 mmol/L (ref 98–111)
Creatinine, Ser: 4.79 mg/dL — ABNORMAL HIGH (ref 0.61–1.24)
GFR calc Af Amer: 14 mL/min — ABNORMAL LOW (ref >60)
GFR, EST NON AFRICAN AMERICAN: 12 mL/min — AB (ref >60)
GLUCOSE: 73 mg/dL (ref 70–99)
PHOSPHORUS: 4.3 mg/dL (ref 2.5–4.6)
POTASSIUM: 3.2 mmol/L — AB (ref 3.5–5.1)
SODIUM: 139 mmol/L (ref 135–145)

## 2018-05-19 LAB — FERRITIN: Ferritin: 487 ng/mL — ABNORMAL HIGH (ref 24–336)

## 2018-05-19 MED ORDER — EPOETIN ALFA 10000 UNIT/ML IJ SOLN
10000.0000 [IU] | INTRAMUSCULAR | Status: DC
  Administered 2018-05-19: 10000 [IU] via SUBCUTANEOUS
  Filled 2018-05-19: qty 1

## 2018-05-20 LAB — PTH, INTACT AND CALCIUM
CALCIUM TOTAL (PTH): 9 mg/dL (ref 8.6–10.2)
PTH: 57 pg/mL (ref 15–65)

## 2018-05-22 LAB — POCT HEMOGLOBIN-HEMACUE: HEMOGLOBIN: 11.1 g/dL — AB (ref 13.0–17.0)

## 2018-06-16 ENCOUNTER — Encounter (HOSPITAL_COMMUNITY)
Admission: RE | Admit: 2018-06-16 | Discharge: 2018-06-16 | Disposition: A | Discharge status: Home / Self Care | Payer: Medicare Other | Source: Ambulatory Visit | Attending: Nephrology | Admitting: Nephrology

## 2018-06-16 DIAGNOSIS — N183 Chronic kidney disease, stage 3 (moderate): Secondary | ICD-10-CM | POA: Insufficient documentation

## 2018-06-16 DIAGNOSIS — D631 Anemia in chronic kidney disease: Secondary | ICD-10-CM | POA: Diagnosis not present

## 2018-06-16 LAB — RENAL FUNCTION PANEL
ALBUMIN: 3.8 g/dL (ref 3.5–5.0)
Anion gap: 11 (ref 5–15)
BUN: 53 mg/dL — ABNORMAL HIGH (ref 8–23)
CALCIUM: 8.8 mg/dL — AB (ref 8.9–10.3)
CO2: 25 mmol/L (ref 22–32)
CREATININE: 5.36 mg/dL — AB (ref 0.61–1.24)
Chloride: 101 mmol/L (ref 98–111)
GFR, EST AFRICAN AMERICAN: 12 mL/min — AB (ref >60)
GFR, EST NON AFRICAN AMERICAN: 10 mL/min — AB (ref >60)
Glucose, Bld: 86 mg/dL (ref 70–99)
PHOSPHORUS: 4.8 mg/dL — AB (ref 2.5–4.6)
Potassium: 4.1 mmol/L (ref 3.5–5.1)
Sodium: 137 mmol/L (ref 135–145)

## 2018-06-16 LAB — IRON AND TIBC
Iron: 44 ug/dL — ABNORMAL LOW (ref 45–182)
SATURATION RATIOS: 18 % (ref 17.9–39.5)
TIBC: 242 ug/dL — AB (ref 250–450)
UIBC: 198 ug/dL

## 2018-06-16 LAB — POCT HEMOGLOBIN-HEMACUE: HEMOGLOBIN: 10.7 g/dL — AB (ref 13.0–17.0)

## 2018-06-16 LAB — FERRITIN: Ferritin: 459 ng/mL — ABNORMAL HIGH (ref 24–336)

## 2018-06-16 MED ORDER — EPOETIN ALFA 10000 UNIT/ML IJ SOLN
10000.0000 [IU] | Freq: Once | INTRAMUSCULAR | Status: DC
  Filled 2018-06-16: qty 1

## 2018-06-17 LAB — PTH, INTACT AND CALCIUM
Calcium, Total (PTH): 9.1 mg/dL (ref 8.6–10.2)
PTH: 121 pg/mL — ABNORMAL HIGH (ref 15–65)

## 2018-06-27 ENCOUNTER — Ambulatory Visit: Payer: Medicare Other | Admitting: Urology

## 2018-06-27 DIAGNOSIS — N2889 Other specified disorders of kidney and ureter: Secondary | ICD-10-CM

## 2018-06-27 DIAGNOSIS — R310 Gross hematuria: Secondary | ICD-10-CM

## 2018-07-05 ENCOUNTER — Encounter (HOSPITAL_COMMUNITY)
Admission: RE | Admit: 2018-07-05 | Discharge: 2018-07-05 | Disposition: A | Discharge status: Home / Self Care | Payer: Medicare Other | Source: Ambulatory Visit | Attending: Nephrology | Admitting: Nephrology

## 2018-07-05 ENCOUNTER — Encounter (HOSPITAL_COMMUNITY): Payer: Self-pay

## 2018-07-05 DIAGNOSIS — D631 Anemia in chronic kidney disease: Secondary | ICD-10-CM | POA: Insufficient documentation

## 2018-07-05 DIAGNOSIS — N183 Chronic kidney disease, stage 3 (moderate): Secondary | ICD-10-CM | POA: Insufficient documentation

## 2018-07-05 MED ORDER — SODIUM CHLORIDE 0.9 % IV SOLN
Freq: Once | INTRAVENOUS | Status: AC
  Administered 2018-07-05: 08:00:00 via INTRAVENOUS

## 2018-07-05 MED ORDER — SODIUM CHLORIDE 0.9 % IV SOLN
510.0000 mg | Freq: Once | INTRAVENOUS | Status: AC
  Administered 2018-07-05: 510 mg via INTRAVENOUS
  Filled 2018-07-05: qty 510

## 2018-07-13 MED ORDER — SODIUM CHLORIDE 0.9 % IV SOLN
510.0000 mg | Freq: Once | INTRAVENOUS | Status: DC
  Filled 2018-07-13: qty 17

## 2018-07-14 ENCOUNTER — Encounter (HOSPITAL_COMMUNITY)
Admission: RE | Admit: 2018-07-14 | Discharge: 2018-07-14 | Disposition: A | Discharge status: Home / Self Care | Payer: Medicare Other | Source: Ambulatory Visit | Attending: Nephrology | Admitting: Nephrology

## 2018-07-14 ENCOUNTER — Encounter (HOSPITAL_COMMUNITY): Payer: Self-pay

## 2018-07-14 DIAGNOSIS — N183 Chronic kidney disease, stage 3 (moderate): Secondary | ICD-10-CM | POA: Diagnosis not present

## 2018-07-14 LAB — POCT HEMOGLOBIN-HEMACUE: Hemoglobin: 11.9 g/dL — ABNORMAL LOW (ref 13.0–17.0)

## 2018-07-14 MED ORDER — SODIUM CHLORIDE 0.9 % IV SOLN
Freq: Once | INTRAVENOUS | Status: AC
  Administered 2018-07-14: 08:00:00 via INTRAVENOUS

## 2018-07-14 MED ORDER — EPOETIN ALFA 10000 UNIT/ML IJ SOLN: 10000.0000 [IU] | Freq: Once | INTRAMUSCULAR | Status: DC

## 2018-07-14 MED ORDER — SODIUM CHLORIDE 0.9 % IV SOLN
510.0000 mg | Freq: Once | INTRAVENOUS | Status: AC
  Administered 2018-07-14: 510 mg via INTRAVENOUS
  Filled 2018-07-14: qty 17

## 2018-08-11 ENCOUNTER — Encounter (HOSPITAL_COMMUNITY): Payer: Self-pay

## 2018-08-11 ENCOUNTER — Other Ambulatory Visit (HOSPITAL_COMMUNITY): Payer: Medicare Other

## 2018-08-11 ENCOUNTER — Ambulatory Visit (HOSPITAL_COMMUNITY): Payer: Medicare Other

## 2018-08-11 ENCOUNTER — Encounter (HOSPITAL_COMMUNITY)
Admission: RE | Admit: 2018-08-11 | Discharge: 2018-08-11 | Disposition: A | Discharge status: Home / Self Care | Payer: Medicare Other | Source: Ambulatory Visit | Attending: Nephrology | Admitting: Nephrology

## 2018-08-11 ENCOUNTER — Other Ambulatory Visit: Payer: Self-pay

## 2018-08-11 DIAGNOSIS — D631 Anemia in chronic kidney disease: Secondary | ICD-10-CM | POA: Diagnosis not present

## 2018-08-11 DIAGNOSIS — N183 Chronic kidney disease, stage 3 (moderate): Secondary | ICD-10-CM | POA: Diagnosis present

## 2018-08-11 LAB — RENAL FUNCTION PANEL
Albumin: 4 g/dL (ref 3.5–5.0)
Anion gap: 12 (ref 5–15)
BUN: 54 mg/dL — ABNORMAL HIGH (ref 8–23)
CO2: 23 mmol/L (ref 22–32)
Calcium: 8.9 mg/dL (ref 8.9–10.3)
Chloride: 103 mmol/L (ref 98–111)
Creatinine, Ser: 4.87 mg/dL — ABNORMAL HIGH (ref 0.61–1.24)
GFR calc Af Amer: 13 mL/min — ABNORMAL LOW (ref >60)
GFR calc non Af Amer: 11 mL/min — ABNORMAL LOW (ref >60)
Glucose, Bld: 136 mg/dL — ABNORMAL HIGH (ref 70–99)
Phosphorus: 4.5 mg/dL (ref 2.5–4.6)
Potassium: 3.9 mmol/L (ref 3.5–5.1)
Sodium: 138 mmol/L (ref 135–145)

## 2018-08-11 LAB — FERRITIN: Ferritin: 941 ng/mL — ABNORMAL HIGH (ref 24–336)

## 2018-08-11 LAB — IRON AND TIBC
Iron: 79 ug/dL (ref 45–182)
Saturation Ratios: 31 % (ref 17.9–39.5)
TIBC: 255 ug/dL (ref 250–450)
UIBC: 176 ug/dL

## 2018-08-11 LAB — POCT HEMOGLOBIN-HEMACUE: Hemoglobin: 11.5 g/dL — ABNORMAL LOW (ref 13.0–17.0)

## 2018-08-11 MED ORDER — EPOETIN ALFA 10000 UNIT/ML IJ SOLN: 10000.0000 [IU] | Freq: Once | INTRAMUSCULAR | Status: DC

## 2018-08-12 LAB — PTH, INTACT AND CALCIUM
Calcium, Total (PTH): 9.4 mg/dL (ref 8.6–10.2)
PTH: 60 pg/mL (ref 15–65)

## 2018-09-07 ENCOUNTER — Other Ambulatory Visit: Payer: Self-pay

## 2018-09-07 ENCOUNTER — Encounter (HOSPITAL_COMMUNITY)
Admission: RE | Admit: 2018-09-07 | Discharge: 2018-09-07 | Disposition: A | Discharge status: Home / Self Care | Payer: Medicare Other | Source: Ambulatory Visit | Attending: Nephrology | Admitting: Nephrology

## 2018-09-07 DIAGNOSIS — D631 Anemia in chronic kidney disease: Secondary | ICD-10-CM | POA: Insufficient documentation

## 2018-09-07 DIAGNOSIS — N183 Chronic kidney disease, stage 3 (moderate): Secondary | ICD-10-CM | POA: Insufficient documentation

## 2018-09-07 LAB — RENAL FUNCTION PANEL
Albumin: 3.9 g/dL (ref 3.5–5.0)
Anion gap: 15 (ref 5–15)
BUN: 59 mg/dL — ABNORMAL HIGH (ref 8–23)
CO2: 22 mmol/L (ref 22–32)
Calcium: 8.9 mg/dL (ref 8.9–10.3)
Chloride: 102 mmol/L (ref 98–111)
Creatinine, Ser: 4.74 mg/dL — ABNORMAL HIGH (ref 0.61–1.24)
GFR calc Af Amer: 14 mL/min — ABNORMAL LOW (ref >60)
GFR calc non Af Amer: 12 mL/min — ABNORMAL LOW (ref >60)
Glucose, Bld: 85 mg/dL (ref 70–99)
Phosphorus: 4.9 mg/dL — ABNORMAL HIGH (ref 2.5–4.6)
Potassium: 3.5 mmol/L (ref 3.5–5.1)
Sodium: 139 mmol/L (ref 135–145)

## 2018-09-07 LAB — IRON AND TIBC
Iron: 58 ug/dL (ref 45–182)
Saturation Ratios: 24 % (ref 17.9–39.5)
TIBC: 245 ug/dL — ABNORMAL LOW (ref 250–450)
UIBC: 187 ug/dL

## 2018-09-07 LAB — MAGNESIUM: Magnesium: 2.2 mg/dL (ref 1.7–2.4)

## 2018-09-07 LAB — FERRITIN: Ferritin: 738 ng/mL — ABNORMAL HIGH (ref 24–336)

## 2018-09-07 LAB — POCT HEMOGLOBIN-HEMACUE: Hemoglobin: 11.3 g/dL — ABNORMAL LOW (ref 13.0–17.0)

## 2018-09-07 MED ORDER — EPOETIN ALFA 20000 UNIT/ML IJ SOLN
INTRAMUSCULAR | Status: AC
  Filled 2018-09-07: qty 1

## 2018-09-07 MED ORDER — EPOETIN ALFA 20000 UNIT/ML IJ SOLN
20000.0000 [IU] | Freq: Once | INTRAMUSCULAR | Status: AC
  Administered 2018-09-07: 08:00:00 20000 [IU] via SUBCUTANEOUS

## 2018-09-08 LAB — VITAMIN D 25 HYDROXY (VIT D DEFICIENCY, FRACTURES): Vit D, 25-Hydroxy: 75.4 ng/mL (ref 30.0–100.0)

## 2018-10-05 ENCOUNTER — Other Ambulatory Visit (HOSPITAL_COMMUNITY): Payer: Medicare Other

## 2018-10-05 ENCOUNTER — Ambulatory Visit (HOSPITAL_COMMUNITY): Payer: Medicare Other

## 2018-10-06 ENCOUNTER — Other Ambulatory Visit: Payer: Self-pay

## 2018-10-06 ENCOUNTER — Encounter (HOSPITAL_COMMUNITY)
Admission: RE | Admit: 2018-10-06 | Discharge: 2018-10-06 | Disposition: A | Discharge status: Home / Self Care | Payer: Medicare Other | Source: Ambulatory Visit | Attending: Nephrology | Admitting: Nephrology

## 2018-10-06 ENCOUNTER — Encounter (HOSPITAL_COMMUNITY): Payer: Self-pay

## 2018-10-06 DIAGNOSIS — D631 Anemia in chronic kidney disease: Secondary | ICD-10-CM | POA: Diagnosis not present

## 2018-10-06 DIAGNOSIS — N183 Chronic kidney disease, stage 3 (moderate): Secondary | ICD-10-CM | POA: Insufficient documentation

## 2018-10-06 LAB — CBC WITH DIFFERENTIAL/PLATELET
Abs Immature Granulocytes: 0.02 10*3/uL (ref 0.00–0.07)
Basophils Absolute: 0 10*3/uL (ref 0.0–0.1)
Basophils Relative: 0 %
Eosinophils Absolute: 0.1 10*3/uL (ref 0.0–0.5)
Eosinophils Relative: 2 %
HCT: 37.3 % — ABNORMAL LOW (ref 39.0–52.0)
Hemoglobin: 12 g/dL — ABNORMAL LOW (ref 13.0–17.0)
Immature Granulocytes: 0 %
Lymphocytes Relative: 10 %
Lymphs Abs: 0.8 10*3/uL (ref 0.7–4.0)
MCH: 28.8 pg (ref 26.0–34.0)
MCHC: 32.2 g/dL (ref 30.0–36.0)
MCV: 89.7 fL (ref 80.0–100.0)
Monocytes Absolute: 0.6 10*3/uL (ref 0.1–1.0)
Monocytes Relative: 8 %
Neutro Abs: 6.4 10*3/uL (ref 1.7–7.7)
Neutrophils Relative %: 80 %
Platelets: 246 10*3/uL (ref 150–400)
RBC: 4.16 MIL/uL — ABNORMAL LOW (ref 4.22–5.81)
RDW: 13 % (ref 11.5–15.5)
WBC: 8 10*3/uL (ref 4.0–10.5)
nRBC: 0 % (ref 0.0–0.2)

## 2018-10-06 LAB — IRON AND TIBC
Iron: 51 ug/dL (ref 45–182)
Saturation Ratios: 20 % (ref 17.9–39.5)
TIBC: 258 ug/dL (ref 250–450)
UIBC: 207 ug/dL

## 2018-10-06 LAB — RENAL FUNCTION PANEL
Albumin: 4.2 g/dL (ref 3.5–5.0)
Anion gap: 15 (ref 5–15)
BUN: 49 mg/dL — ABNORMAL HIGH (ref 8–23)
CO2: 22 mmol/L (ref 22–32)
Calcium: 8.8 mg/dL — ABNORMAL LOW (ref 8.9–10.3)
Chloride: 101 mmol/L (ref 98–111)
Creatinine, Ser: 4.85 mg/dL — ABNORMAL HIGH (ref 0.61–1.24)
GFR calc Af Amer: 13 mL/min — ABNORMAL LOW (ref >60)
GFR calc non Af Amer: 11 mL/min — ABNORMAL LOW (ref >60)
Glucose, Bld: 76 mg/dL (ref 70–99)
Phosphorus: 5.7 mg/dL — ABNORMAL HIGH (ref 2.5–4.6)
Potassium: 3.1 mmol/L — ABNORMAL LOW (ref 3.5–5.1)
Sodium: 138 mmol/L (ref 135–145)

## 2018-10-06 LAB — MAGNESIUM: Magnesium: 2.2 mg/dL (ref 1.7–2.4)

## 2018-10-06 LAB — POCT HEMOGLOBIN-HEMACUE: Hemoglobin: 12 g/dL — ABNORMAL LOW (ref 13.0–17.0)

## 2018-10-06 LAB — FERRITIN: Ferritin: 637 ng/mL — ABNORMAL HIGH (ref 24–336)

## 2018-10-06 MED ORDER — EPOETIN ALFA 20000 UNIT/ML IJ SOLN: 20000.0000 [IU] | INTRAMUSCULAR | Status: DC

## 2018-11-03 ENCOUNTER — Other Ambulatory Visit: Payer: Self-pay

## 2018-11-03 ENCOUNTER — Encounter (HOSPITAL_COMMUNITY)
Admission: RE | Admit: 2018-11-03 | Discharge: 2018-11-03 | Disposition: A | Discharge status: Home / Self Care | Payer: Medicare Other | Source: Ambulatory Visit | Attending: Nephrology | Admitting: Nephrology

## 2018-11-03 ENCOUNTER — Encounter (HOSPITAL_COMMUNITY): Payer: Self-pay

## 2018-11-03 DIAGNOSIS — D631 Anemia in chronic kidney disease: Secondary | ICD-10-CM | POA: Diagnosis not present

## 2018-11-03 DIAGNOSIS — N185 Chronic kidney disease, stage 5: Secondary | ICD-10-CM | POA: Diagnosis not present

## 2018-11-03 LAB — IRON AND TIBC
Iron: 48 ug/dL (ref 45–182)
Saturation Ratios: 19 % (ref 17.9–39.5)
TIBC: 255 ug/dL (ref 250–450)
UIBC: 207 ug/dL

## 2018-11-03 LAB — CBC WITH DIFFERENTIAL/PLATELET
Abs Immature Granulocytes: 0.02 10*3/uL (ref 0.00–0.07)
Basophils Absolute: 0 10*3/uL (ref 0.0–0.1)
Basophils Relative: 0 %
Eosinophils Absolute: 0.1 10*3/uL (ref 0.0–0.5)
Eosinophils Relative: 1 %
HCT: 34.6 % — ABNORMAL LOW (ref 39.0–52.0)
Hemoglobin: 11.1 g/dL — ABNORMAL LOW (ref 13.0–17.0)
Immature Granulocytes: 0 %
Lymphocytes Relative: 11 %
Lymphs Abs: 0.8 10*3/uL (ref 0.7–4.0)
MCH: 29.2 pg (ref 26.0–34.0)
MCHC: 32.1 g/dL (ref 30.0–36.0)
MCV: 91.1 fL (ref 80.0–100.0)
Monocytes Absolute: 0.6 10*3/uL (ref 0.1–1.0)
Monocytes Relative: 7 %
Neutro Abs: 6 10*3/uL (ref 1.7–7.7)
Neutrophils Relative %: 81 %
Platelets: 231 10*3/uL (ref 150–400)
RBC: 3.8 MIL/uL — ABNORMAL LOW (ref 4.22–5.81)
RDW: 12.8 % (ref 11.5–15.5)
WBC: 7.6 10*3/uL (ref 4.0–10.5)
nRBC: 0 % (ref 0.0–0.2)

## 2018-11-03 LAB — RENAL FUNCTION PANEL
Albumin: 4.1 g/dL (ref 3.5–5.0)
Anion gap: 16 — ABNORMAL HIGH (ref 5–15)
BUN: 49 mg/dL — ABNORMAL HIGH (ref 8–23)
CO2: 22 mmol/L (ref 22–32)
Calcium: 9 mg/dL (ref 8.9–10.3)
Chloride: 102 mmol/L (ref 98–111)
Creatinine, Ser: 4.83 mg/dL — ABNORMAL HIGH (ref 0.61–1.24)
GFR calc Af Amer: 13 mL/min — ABNORMAL LOW (ref >60)
GFR calc non Af Amer: 12 mL/min — ABNORMAL LOW (ref >60)
Glucose, Bld: 71 mg/dL (ref 70–99)
Phosphorus: 3.9 mg/dL (ref 2.5–4.6)
Potassium: 3.6 mmol/L (ref 3.5–5.1)
Sodium: 140 mmol/L (ref 135–145)

## 2018-11-03 LAB — MAGNESIUM: Magnesium: 2.4 mg/dL (ref 1.7–2.4)

## 2018-11-03 LAB — FERRITIN: Ferritin: 580 ng/mL — ABNORMAL HIGH (ref 24–336)

## 2018-11-03 LAB — POCT HEMOGLOBIN-HEMACUE: Hemoglobin: 11.6 g/dL — ABNORMAL LOW (ref 13.0–17.0)

## 2018-11-03 MED ORDER — EPOETIN ALFA 10000 UNIT/ML IJ SOLN: 20000.0000 [IU] | Freq: Once | INTRAMUSCULAR | Status: DC

## 2018-11-06 LAB — PTH, INTACT AND CALCIUM: Calcium, Total (PTH): 9.2 mg/dL (ref 8.6–10.2)

## 2018-11-07 NOTE — Progress Notes (Signed)
Notified Deana at [facility] that PTH was not completed,as serum was not received.  States that it can be repeated at next visit and patient should not need to come earlier for redraw.

## 2018-11-30 ENCOUNTER — Encounter (HOSPITAL_COMMUNITY): Payer: Self-pay

## 2018-11-30 ENCOUNTER — Other Ambulatory Visit: Payer: Self-pay

## 2018-11-30 ENCOUNTER — Ambulatory Visit (HOSPITAL_COMMUNITY)
Admission: RE | Admit: 2018-11-30 | Discharge: 2018-11-30 | Disposition: A | Discharge status: Home / Self Care | Payer: Medicare Other | Source: Ambulatory Visit | Attending: Nephrology | Admitting: Nephrology

## 2018-11-30 DIAGNOSIS — I132 Hypertensive heart and chronic kidney disease with heart failure and with stage 5 chronic kidney disease, or end stage renal disease: Secondary | ICD-10-CM | POA: Diagnosis not present

## 2018-11-30 DIAGNOSIS — R0602 Shortness of breath: Secondary | ICD-10-CM | POA: Diagnosis not present

## 2018-11-30 LAB — CBC WITH DIFFERENTIAL/PLATELET
Abs Immature Granulocytes: 0.05 10*3/uL (ref 0.00–0.07)
Basophils Absolute: 0 10*3/uL (ref 0.0–0.1)
Basophils Relative: 0 %
Eosinophils Absolute: 0.1 10*3/uL (ref 0.0–0.5)
Eosinophils Relative: 1 %
HCT: 35.5 % — ABNORMAL LOW (ref 39.0–52.0)
Hemoglobin: 11.2 g/dL — ABNORMAL LOW (ref 13.0–17.0)
Immature Granulocytes: 1 %
Lymphocytes Relative: 9 %
Lymphs Abs: 0.9 10*3/uL (ref 0.7–4.0)
MCH: 28.5 pg (ref 26.0–34.0)
MCHC: 31.5 g/dL (ref 30.0–36.0)
MCV: 90.3 fL (ref 80.0–100.0)
Monocytes Absolute: 0.7 10*3/uL (ref 0.1–1.0)
Monocytes Relative: 7 %
Neutro Abs: 7.4 10*3/uL (ref 1.7–7.7)
Neutrophils Relative %: 82 %
Platelets: 226 10*3/uL (ref 150–400)
RBC: 3.93 MIL/uL — ABNORMAL LOW (ref 4.22–5.81)
RDW: 13.1 % (ref 11.5–15.5)
WBC: 9.1 10*3/uL (ref 4.0–10.5)
nRBC: 0 % (ref 0.0–0.2)

## 2018-11-30 LAB — RENAL FUNCTION PANEL
Albumin: 3.9 g/dL (ref 3.5–5.0)
Anion gap: 16 — ABNORMAL HIGH (ref 5–15)
BUN: 55 mg/dL — ABNORMAL HIGH (ref 8–23)
CO2: 22 mmol/L (ref 22–32)
Calcium: 9 mg/dL (ref 8.9–10.3)
Chloride: 99 mmol/L (ref 98–111)
Creatinine, Ser: 5.05 mg/dL — ABNORMAL HIGH (ref 0.61–1.24)
GFR calc Af Amer: 13 mL/min — ABNORMAL LOW (ref >60)
GFR calc non Af Amer: 11 mL/min — ABNORMAL LOW (ref >60)
Glucose, Bld: 93 mg/dL (ref 70–99)
Phosphorus: 4.6 mg/dL (ref 2.5–4.6)
Potassium: 3.7 mmol/L (ref 3.5–5.1)
Sodium: 137 mmol/L (ref 135–145)

## 2018-11-30 LAB — IRON AND TIBC
Iron: 43 ug/dL — ABNORMAL LOW (ref 45–182)
Saturation Ratios: 18 % (ref 17.9–39.5)
TIBC: 245 ug/dL — ABNORMAL LOW (ref 250–450)
UIBC: 202 ug/dL

## 2018-11-30 LAB — FERRITIN: Ferritin: 589 ng/mL — ABNORMAL HIGH (ref 24–336)

## 2018-11-30 LAB — MAGNESIUM: Magnesium: 2.1 mg/dL (ref 1.7–2.4)

## 2018-11-30 LAB — POCT HEMOGLOBIN-HEMACUE: Hemoglobin: 11 g/dL — ABNORMAL LOW (ref 13.0–17.0)

## 2018-11-30 MED ORDER — EPOETIN ALFA 20000 UNIT/ML IJ SOLN
20000.0000 [IU] | INTRAMUSCULAR | Status: DC
  Administered 2018-11-30: 20000 [IU] via SUBCUTANEOUS

## 2018-11-30 MED ORDER — EPOETIN ALFA 20000 UNIT/ML IJ SOLN
INTRAMUSCULAR | Status: AC
  Filled 2018-11-30: qty 1

## 2018-12-01 ENCOUNTER — Emergency Department (HOSPITAL_COMMUNITY): Payer: Medicare Other

## 2018-12-01 ENCOUNTER — Encounter (HOSPITAL_COMMUNITY): Payer: Self-pay | Admitting: Emergency Medicine

## 2018-12-01 ENCOUNTER — Inpatient Hospital Stay (HOSPITAL_COMMUNITY)
Admission: EM | Admit: 2018-12-01 | Discharge: 2018-12-07 | DRG: 291 | Disposition: A | Discharge status: Home Health | Payer: Medicare Other | Attending: Internal Medicine | Admitting: Internal Medicine

## 2018-12-01 ENCOUNTER — Other Ambulatory Visit: Payer: Self-pay

## 2018-12-01 DIAGNOSIS — K5900 Constipation, unspecified: Secondary | ICD-10-CM | POA: Diagnosis not present

## 2018-12-01 DIAGNOSIS — I150 Renovascular hypertension: Secondary | ICD-10-CM

## 2018-12-01 DIAGNOSIS — N186 End stage renal disease: Secondary | ICD-10-CM | POA: Diagnosis present

## 2018-12-01 DIAGNOSIS — E1122 Type 2 diabetes mellitus with diabetic chronic kidney disease: Secondary | ICD-10-CM | POA: Diagnosis present

## 2018-12-01 DIAGNOSIS — E1129 Type 2 diabetes mellitus with other diabetic kidney complication: Secondary | ICD-10-CM | POA: Diagnosis not present

## 2018-12-01 DIAGNOSIS — I509 Heart failure, unspecified: Secondary | ICD-10-CM

## 2018-12-01 DIAGNOSIS — E785 Hyperlipidemia, unspecified: Secondary | ICD-10-CM | POA: Diagnosis present

## 2018-12-01 DIAGNOSIS — E119 Type 2 diabetes mellitus without complications: Secondary | ICD-10-CM

## 2018-12-01 DIAGNOSIS — N185 Chronic kidney disease, stage 5: Secondary | ICD-10-CM | POA: Diagnosis present

## 2018-12-01 DIAGNOSIS — Z7682 Awaiting organ transplant status: Secondary | ICD-10-CM | POA: Diagnosis not present

## 2018-12-01 DIAGNOSIS — I132 Hypertensive heart and chronic kidney disease with heart failure and with stage 5 chronic kidney disease, or end stage renal disease: Secondary | ICD-10-CM | POA: Diagnosis present

## 2018-12-01 DIAGNOSIS — I429 Cardiomyopathy, unspecified: Secondary | ICD-10-CM | POA: Diagnosis present

## 2018-12-01 DIAGNOSIS — J9601 Acute respiratory failure with hypoxia: Secondary | ICD-10-CM | POA: Diagnosis present

## 2018-12-01 DIAGNOSIS — Z91041 Radiographic dye allergy status: Secondary | ICD-10-CM

## 2018-12-01 DIAGNOSIS — E669 Obesity, unspecified: Secondary | ICD-10-CM | POA: Diagnosis present

## 2018-12-01 DIAGNOSIS — T502X5A Adverse effect of carbonic-anhydrase inhibitors, benzothiadiazides and other diuretics, initial encounter: Secondary | ICD-10-CM | POA: Diagnosis not present

## 2018-12-01 DIAGNOSIS — N179 Acute kidney failure, unspecified: Secondary | ICD-10-CM | POA: Diagnosis present

## 2018-12-01 DIAGNOSIS — I5043 Acute on chronic combined systolic (congestive) and diastolic (congestive) heart failure: Secondary | ICD-10-CM | POA: Diagnosis present

## 2018-12-01 DIAGNOSIS — D631 Anemia in chronic kidney disease: Secondary | ICD-10-CM | POA: Diagnosis present

## 2018-12-01 DIAGNOSIS — I5021 Acute systolic (congestive) heart failure: Secondary | ICD-10-CM | POA: Diagnosis present

## 2018-12-01 DIAGNOSIS — G4700 Insomnia, unspecified: Secondary | ICD-10-CM | POA: Diagnosis present

## 2018-12-01 DIAGNOSIS — N2581 Secondary hyperparathyroidism of renal origin: Secondary | ICD-10-CM | POA: Diagnosis present

## 2018-12-01 DIAGNOSIS — Z87891 Personal history of nicotine dependence: Secondary | ICD-10-CM

## 2018-12-01 DIAGNOSIS — E876 Hypokalemia: Secondary | ICD-10-CM | POA: Diagnosis not present

## 2018-12-01 DIAGNOSIS — Z20828 Contact with and (suspected) exposure to other viral communicable diseases: Secondary | ICD-10-CM | POA: Diagnosis present

## 2018-12-01 DIAGNOSIS — I10 Essential (primary) hypertension: Secondary | ICD-10-CM | POA: Diagnosis present

## 2018-12-01 DIAGNOSIS — J9602 Acute respiratory failure with hypercapnia: Secondary | ICD-10-CM | POA: Diagnosis present

## 2018-12-01 DIAGNOSIS — Z887 Allergy status to serum and vaccine status: Secondary | ICD-10-CM | POA: Diagnosis not present

## 2018-12-01 DIAGNOSIS — Z794 Long term (current) use of insulin: Secondary | ICD-10-CM

## 2018-12-01 DIAGNOSIS — Z833 Family history of diabetes mellitus: Secondary | ICD-10-CM

## 2018-12-01 DIAGNOSIS — R0602 Shortness of breath: Secondary | ICD-10-CM | POA: Diagnosis present

## 2018-12-01 DIAGNOSIS — Z79899 Other long term (current) drug therapy: Secondary | ICD-10-CM | POA: Diagnosis not present

## 2018-12-01 LAB — BLOOD GAS, ARTERIAL
Acid-base deficit: 1.7 mmol/L (ref 0.0–2.0)
Bicarbonate: 22.2 mmol/L (ref 20.0–28.0)
FIO2: 40
O2 Saturation: 96.9 %
Patient temperature: 36.8
pCO2 arterial: 54.7 mmHg — ABNORMAL HIGH (ref 32.0–48.0)
pH, Arterial: 7.27 — ABNORMAL LOW (ref 7.350–7.450)
pO2, Arterial: 102 mmHg (ref 83.0–108.0)

## 2018-12-01 LAB — MRSA PCR SCREENING: MRSA by PCR: NEGATIVE

## 2018-12-01 LAB — COMPREHENSIVE METABOLIC PANEL
ALT: 15 U/L (ref 0–44)
AST: 15 U/L (ref 15–41)
Albumin: 4.3 g/dL (ref 3.5–5.0)
Alkaline Phosphatase: 42 U/L (ref 38–126)
Anion gap: 16 — ABNORMAL HIGH (ref 5–15)
BUN: 61 mg/dL — ABNORMAL HIGH (ref 8–23)
CO2: 24 mmol/L (ref 22–32)
Calcium: 9.2 mg/dL (ref 8.9–10.3)
Chloride: 101 mmol/L (ref 98–111)
Creatinine, Ser: 5.29 mg/dL — ABNORMAL HIGH (ref 0.61–1.24)
GFR calc Af Amer: 12 mL/min — ABNORMAL LOW (ref >60)
GFR calc non Af Amer: 10 mL/min — ABNORMAL LOW (ref >60)
Glucose, Bld: 196 mg/dL — ABNORMAL HIGH (ref 70–99)
Potassium: 4.1 mmol/L (ref 3.5–5.1)
Sodium: 141 mmol/L (ref 135–145)
Total Bilirubin: 0.8 mg/dL (ref 0.3–1.2)
Total Protein: 8.1 g/dL (ref 6.5–8.1)

## 2018-12-01 LAB — CBC WITH DIFFERENTIAL/PLATELET
Abs Immature Granulocytes: 0.08 10*3/uL — ABNORMAL HIGH (ref 0.00–0.07)
Basophils Absolute: 0 10*3/uL (ref 0.0–0.1)
Basophils Relative: 0 %
Eosinophils Absolute: 0 10*3/uL (ref 0.0–0.5)
Eosinophils Relative: 0 %
HCT: 38.4 % — ABNORMAL LOW (ref 39.0–52.0)
Hemoglobin: 12 g/dL — ABNORMAL LOW (ref 13.0–17.0)
Immature Granulocytes: 1 %
Lymphocytes Relative: 4 %
Lymphs Abs: 0.4 10*3/uL — ABNORMAL LOW (ref 0.7–4.0)
MCH: 28.5 pg (ref 26.0–34.0)
MCHC: 31.3 g/dL (ref 30.0–36.0)
MCV: 91.2 fL (ref 80.0–100.0)
Monocytes Absolute: 0.3 10*3/uL (ref 0.1–1.0)
Monocytes Relative: 3 %
Neutro Abs: 9.3 10*3/uL — ABNORMAL HIGH (ref 1.7–7.7)
Neutrophils Relative %: 92 %
Platelets: 260 10*3/uL (ref 150–400)
RBC: 4.21 MIL/uL — ABNORMAL LOW (ref 4.22–5.81)
RDW: 13.3 % (ref 11.5–15.5)
WBC: 10 10*3/uL (ref 4.0–10.5)
nRBC: 0 % (ref 0.0–0.2)

## 2018-12-01 LAB — GLUCOSE, CAPILLARY
Glucose-Capillary: 120 mg/dL — ABNORMAL HIGH (ref 70–99)
Glucose-Capillary: 123 mg/dL — ABNORMAL HIGH (ref 70–99)

## 2018-12-01 LAB — SARS CORONAVIRUS 2 BY RT PCR (HOSPITAL ORDER, PERFORMED IN ~~LOC~~ HOSPITAL LAB): SARS Coronavirus 2: NEGATIVE

## 2018-12-01 LAB — CBG MONITORING, ED: Glucose-Capillary: 128 mg/dL — ABNORMAL HIGH (ref 70–99)

## 2018-12-01 LAB — PTH, INTACT AND CALCIUM
Calcium, Total (PTH): 9.1 mg/dL (ref 8.6–10.2)
PTH: 95 pg/mL — ABNORMAL HIGH (ref 15–65)

## 2018-12-01 LAB — VITAMIN D 25 HYDROXY (VIT D DEFICIENCY, FRACTURES): Vit D, 25-Hydroxy: 69.4 ng/mL (ref 30.0–100.0)

## 2018-12-01 MED ORDER — HEPARIN SODIUM (PORCINE) 5000 UNIT/ML IJ SOLN
5000.0000 [IU] | Freq: Three times a day (TID) | INTRAMUSCULAR | Status: DC
  Administered 2018-12-01 – 2018-12-07 (×16): 5000 [IU] via SUBCUTANEOUS
  Filled 2018-12-01 (×17): qty 1

## 2018-12-01 MED ORDER — ONDANSETRON HCL 4 MG/2ML IJ SOLN
4.0000 mg | Freq: Four times a day (QID) | INTRAMUSCULAR | Status: DC | PRN
  Administered 2018-12-01 – 2018-12-02 (×3): 4 mg via INTRAVENOUS
  Filled 2018-12-01 (×3): qty 2

## 2018-12-01 MED ORDER — SODIUM CHLORIDE 0.9% FLUSH: 3.0000 mL | INTRAVENOUS | Status: DC | PRN

## 2018-12-01 MED ORDER — ACETAMINOPHEN 325 MG PO TABS: 650.0000 mg | ORAL_TABLET | ORAL | Status: DC | PRN

## 2018-12-01 MED ORDER — ATORVASTATIN CALCIUM 20 MG PO TABS
20.0000 mg | ORAL_TABLET | Freq: Every day | ORAL | Status: DC
  Administered 2018-12-01 – 2018-12-06 (×6): 20 mg via ORAL
  Filled 2018-12-01 (×6): qty 1

## 2018-12-01 MED ORDER — SODIUM CHLORIDE 0.9 % IV SOLN: 250.0000 mL | INTRAVENOUS | Status: DC | PRN

## 2018-12-01 MED ORDER — NITROGLYCERIN IN D5W 200-5 MCG/ML-% IV SOLN
5.0000 ug/min | INTRAVENOUS | Status: DC
  Administered 2018-12-01: 5 ug/min via INTRAVENOUS
  Filled 2018-12-01 (×2): qty 250

## 2018-12-01 MED ORDER — FUROSEMIDE 10 MG/ML IJ SOLN
80.0000 mg | Freq: Once | INTRAMUSCULAR | Status: AC
  Administered 2018-12-01: 80 mg via INTRAVENOUS
  Filled 2018-12-01: qty 8

## 2018-12-01 MED ORDER — HYDRALAZINE HCL 25 MG PO TABS
25.0000 mg | ORAL_TABLET | Freq: Two times a day (BID) | ORAL | Status: DC
  Administered 2018-12-01 – 2018-12-07 (×12): 25 mg via ORAL
  Filled 2018-12-01 (×13): qty 1

## 2018-12-01 MED ORDER — DOXAZOSIN MESYLATE 2 MG PO TABS
4.0000 mg | ORAL_TABLET | Freq: Every day | ORAL | Status: DC
  Administered 2018-12-01 – 2018-12-06 (×6): 4 mg via ORAL
  Filled 2018-12-01 (×6): qty 2

## 2018-12-01 MED ORDER — INSULIN ASPART 100 UNIT/ML ~~LOC~~ SOLN
0.0000 [IU] | Freq: Three times a day (TID) | SUBCUTANEOUS | Status: DC
  Administered 2018-12-02 (×2): 2 [IU] via SUBCUTANEOUS
  Administered 2018-12-02: 3 [IU] via SUBCUTANEOUS
  Administered 2018-12-03: 12:00:00 5 [IU] via SUBCUTANEOUS
  Administered 2018-12-03: 3 [IU] via SUBCUTANEOUS
  Administered 2018-12-03: 5 [IU] via SUBCUTANEOUS
  Administered 2018-12-05 – 2018-12-07 (×3): 2 [IU] via SUBCUTANEOUS

## 2018-12-01 MED ORDER — FUROSEMIDE 10 MG/ML IJ SOLN
160.0000 mg | Freq: Four times a day (QID) | INTRAVENOUS | Status: DC
  Administered 2018-12-01 – 2018-12-04 (×10): 160 mg via INTRAVENOUS
  Filled 2018-12-01 (×16): qty 16

## 2018-12-01 MED ORDER — SIMVASTATIN 20 MG PO TABS: 40.0000 mg | ORAL_TABLET | Freq: Every day | ORAL | Status: DC

## 2018-12-01 MED ORDER — VERAPAMIL HCL ER 180 MG PO TBCR
360.0000 mg | EXTENDED_RELEASE_TABLET | Freq: Every day | ORAL | Status: DC
  Administered 2018-12-01 – 2018-12-03 (×3): 360 mg via ORAL
  Filled 2018-12-01 (×5): qty 2

## 2018-12-01 MED ORDER — SODIUM CHLORIDE 0.9% FLUSH
3.0000 mL | Freq: Two times a day (BID) | INTRAVENOUS | Status: DC
  Administered 2018-12-02 – 2018-12-06 (×8): 3 mL via INTRAVENOUS

## 2018-12-01 MED ORDER — INSULIN GLARGINE 100 UNIT/ML ~~LOC~~ SOLN
20.0000 [IU] | Freq: Every day | SUBCUTANEOUS | Status: DC
  Administered 2018-12-01 – 2018-12-06 (×6): 20 [IU] via SUBCUTANEOUS
  Filled 2018-12-01 (×7): qty 0.2

## 2018-12-01 MED ORDER — INSULIN ASPART 100 UNIT/ML ~~LOC~~ SOLN: 0.0000 [IU] | Freq: Every day | SUBCUTANEOUS | Status: DC

## 2018-12-01 MED ORDER — POTASSIUM CHLORIDE CRYS ER 20 MEQ PO TBCR
40.0000 meq | EXTENDED_RELEASE_TABLET | Freq: Three times a day (TID) | ORAL | Status: DC
  Administered 2018-12-01 – 2018-12-03 (×7): 40 meq via ORAL
  Filled 2018-12-01 (×9): qty 2

## 2018-12-01 MED ORDER — CALCITRIOL 0.25 MCG PO CAPS
0.2500 ug | ORAL_CAPSULE | Freq: Every day | ORAL | Status: DC
  Administered 2018-12-02 – 2018-12-07 (×6): 0.25 ug via ORAL
  Filled 2018-12-01 (×6): qty 1

## 2018-12-01 MED ORDER — TRAZODONE HCL 50 MG PO TABS
100.0000 mg | ORAL_TABLET | Freq: Every day | ORAL | Status: DC
  Administered 2018-12-01 – 2018-12-03 (×3): 100 mg via ORAL
  Filled 2018-12-01 (×3): qty 2

## 2018-12-01 NOTE — ED Triage Notes (Signed)
Per EMS, pt c/o sudden onset of SOB and weakness that began about hour ago. Pt has no hx of breathing difficulties. Pt is ESRD, has a fistula, but not started dialysis. Upon EMS arrival, pt diaphoretic, sats 84% on RA. Pt placed on NRB and sats increased to 95%. Pt given albuterol neb tx en route. Pt denies fever, cough, or chest pain. Pt labored upon arrived to ED. EDP notified, respiratory at bedside.

## 2018-12-01 NOTE — ED Notes (Signed)
ED TO INPATIENT HANDOFF REPORT  ED Nurse Name and Phone #: Gaspar Bidding [phone]  S Name/Age/Gender Steven Perez 68 y.o. male Room/Bed: APA02/APA02  Code Status   Code Status: Prior  Home/SNF/Other Home Patient oriented to: self, place, time and situation Is this baseline? Yes   Triage Complete: Triage complete  Chief Complaint Shortness of Breath  Triage Note Per EMS, pt c/o sudden onset of SOB and weakness that began about hour ago. Pt has no hx of breathing difficulties. Pt is ESRD, has a fistula, but not started dialysis. Upon EMS arrival, pt diaphoretic, sats 84% on RA. Pt placed on NRB and sats increased to 95%. Pt given albuterol neb tx en route. Pt denies fever, cough, or chest pain. Pt labored upon arrived to ED. EDP notified, respiratory at bedside.   Allergies Allergies  Allergen Reactions  . Contrast Media [Iodinated Diagnostic Agents] Other (See Comments)    Renal failure after administration of CT contrast    . Tetanus Toxoids Swelling    "bad reaction;" told not to take this again    Level of Care/Admitting Diagnosis ED Disposition    ED Disposition Condition [place]: [facility] [[id number]]  Level of Care: Stepdown [14]  Covid Evaluation: Confirmed COVID Negative  Diagnosis: Acute respiratory failure with hypoxia and hypercapnia [facility]) [[id number]]  Admitting Physician: Truett Mainland [[id number]]  Attending Physician: Truett Mainland [[id number]]  Estimated length of stay: 5 - 7 days  Certification:: I certify this patient will need inpatient services for at least 2 midnights  PT Class (Do Not Modify): Inpatient [101]  PT Acc Code (Do Not Modify): Private [1]       B Medical/Surgery History Past Medical History:  Diagnosis Date  . Acute on chronic renal insufficiency   . Anemia    assoc w/ chronic reanl failure  . Arthralgia   . Chronic kidney disease    Stage 4  . Diabetes mellitus   . Dyslipidemia   . Erectile dysfunction due to  arterial insufficiency   . HLD (hyperlipidemia)   . Hypertension   . Insomnia   . Left renal mass   . Obesity   . Secondary hyperparathyroidism of renal origin ([place])   . Type 2 diabetes mellitus ([place])    Past Surgical History:  Procedure Laterality Date  . AV FISTULA PLACEMENT Left 11/02/2016   Procedure: ARTERIOVENOUS (AV) FISTULA CREATION LEFT UPPER ARM;  Surgeon: Elam Dutch, MD;  Location: [place];  Service: Vascular;  Laterality: Left;  . IR GENERIC HISTORICAL  06/04/2014   IR RADIOLOGIST EVAL & MGMT 06/04/2014 Aletta Edouard, MD [facility] INTERV RAD  . IR GENERIC HISTORICAL  01/15/2016   IR RADIOLOGIST EVAL & MGMT 01/15/2016 [facility] INTERV RAD  . IR RADIOLOGIST EVAL & MGMT  01/25/2017  . RENAL BIOPSY    . RENAL BIOPSY, PERCUTANEOUS    . TONSILLECTOMY       A IV Location/Drains/Wounds Patient Lines/Drains/Airways Status   Active Line/Drains/Airways    Name:   Placement date:   Placement time:   Site:   Days:   Peripheral IV 12/01/18 Right Forearm   12/01/18    1356    Forearm   less than 1   Fistula / Graft Left Upper arm Arteriovenous fistula   11/02/16    0831    Upper arm   759   Incision (Closed) 12/03/13 Back Right;Lower   12/03/13    1102     1824  Incision (Closed) 11/02/16 Arm Left   11/02/16    0811     759          Intake/Output Last 24 hours No intake or output data in the 24 hours ending 12/01/18 1634  Labs/Imaging Results for orders placed or performed during the hospital encounter of 12/01/18 (from the past 48 hour(s))  SARS Coronavirus 2 (CEPHEID - Performed in [place] hospital lab), Hosp Order     Status: None   Collection Time: 12/01/18  1:27 PM   Specimen: Nasopharyngeal Swab  Result Value Ref Range   SARS Coronavirus 2 NEGATIVE NEGATIVE    Comment: (NOTE) If result is NEGATIVE SARS-CoV-2 target nucleic acids are NOT DETECTED. The SARS-CoV-2 RNA is generally detectable in upper and lower  respiratory specimens during the acute phase of infection.  The lowest  concentration of SARS-CoV-2 viral copies this assay can detect is 250  copies / mL. A negative result does not preclude SARS-CoV-2 infection  and should not be used as the sole basis for treatment or other  patient management decisions.  A negative result may occur with  improper specimen collection / handling, submission of specimen other  than nasopharyngeal swab, presence of viral mutation(s) within the  areas targeted by this assay, and inadequate number of viral copies  (<250 copies / mL). A negative result must be combined with clinical  observations, patient history, and epidemiological information. If result is POSITIVE SARS-CoV-2 target nucleic acids are DETECTED. The SARS-CoV-2 RNA is generally detectable in upper and lower  respiratory specimens dur ing the acute phase of infection.  Positive  results are indicative of active infection with SARS-CoV-2.  Clinical  correlation with patient history and other diagnostic information is  necessary to determine patient infection status.  Positive results do  not rule out bacterial infection or co-infection with other viruses. If result is PRESUMPTIVE POSTIVE SARS-CoV-2 nucleic acids MAY BE PRESENT.   A presumptive positive result was obtained on the submitted specimen  and confirmed on repeat testing.  While 2019 novel coronavirus  (SARS-CoV-2) nucleic acids may be present in the submitted sample  additional confirmatory testing may be necessary for epidemiological  and / or clinical management purposes  to differentiate between  SARS-CoV-2 and other Sarbecovirus currently known to infect humans.  If clinically indicated additional testing with an alternate test  methodology [phone]) is advised. The SARS-CoV-2 RNA is generally  detectable in upper and lower respiratory sp ecimens during the acute  phase of infection. The expected result is Negative. Fact Sheet for Patients:   StrictlyIdeas.no Fact Sheet for Healthcare Providers: BankingDealers.co.za This test is not yet approved or cleared by the [place] FDA and has been authorized for detection and/or diagnosis of SARS-CoV-2 by FDA under an Emergency Use Authorization (EUA).  This EUA will remain in effect (meaning this test can be used) for the duration of the COVID-19 declaration under Section 564(b)(1) of the Act, 21 U.S.C. section 360bbb-3(b)(1), unless the authorization is terminated or revoked sooner. Performed at [facility], [address], [place], [place] [address]   Blood gas, arterial (at [facility])     Status: Abnormal   Collection Time: 12/01/18  1:27 PM  Result Value Ref Range   FIO2 40.00    pH, Arterial 7.270 (L) 7.350 - 7.450   pCO2 arterial 54.7 (H) 32.0 - 48.0 mmHg   pO2, Arterial 102 83.0 - 108.0 mmHg   Bicarbonate 22.2 20.0 - 28.0 mmol/L  Acid-base deficit 1.7 0.0 - 2.0 mmol/L   O2 Saturation 96.9 %   Patient temperature 36.8    Allens test (pass/fail) RIGHT RADIAL (A) PASS    Comment: Performed at [facility], [address], [place], [place] [address]  CBC with Differential     Status: Abnormal   Collection Time: 12/01/18  1:30 PM  Result Value Ref Range   WBC 10.0 4.0 - 10.5 K/uL   RBC 4.21 (L) 4.22 - 5.81 MIL/uL   Hemoglobin 12.0 (L) 13.0 - 17.0 g/dL   HCT 38.4 (L) 39.0 - 52.0 %   MCV 91.2 80.0 - 100.0 fL   MCH 28.5 26.0 - 34.0 pg   MCHC 31.3 30.0 - 36.0 g/dL   RDW 13.3 11.5 - 15.5 %   Platelets 260 150 - 400 K/uL   nRBC 0.0 0.0 - 0.2 %   Neutrophils Relative % 92 %   Neutro Abs 9.3 (H) 1.7 - 7.7 K/uL   Lymphocytes Relative 4 %   Lymphs Abs 0.4 (L) 0.7 - 4.0 K/uL   Monocytes Relative 3 %   Monocytes Absolute 0.3 0.1 - 1.0 K/uL   Eosinophils Relative 0 %   Eosinophils Absolute 0.0 0.0 - 0.5 K/uL   Basophils Relative 0 %   Basophils Absolute 0.0 0.0 - 0.1 K/uL   Immature Granulocytes 1 %   Abs Immature Granulocytes  0.08 (H) 0.00 - 0.07 K/uL    Comment: Performed at [facility], [address] [place], [place] [address]  Comprehensive metabolic panel     Status: Abnormal   Collection Time: 12/01/18  1:30 PM  Result Value Ref Range   Sodium 141 135 - 145 mmol/L   Potassium 4.1 3.5 - 5.1 mmol/L   Chloride 101 98 - 111 mmol/L   CO2 24 22 - 32 mmol/L   Glucose, Bld 196 (H) 70 - 99 mg/dL   BUN 61 (H) 8 - 23 mg/dL   Creatinine, Ser 5.29 (H) 0.61 - 1.24 mg/dL   Calcium 9.2 8.9 - 10.3 mg/dL   Total Protein 8.1 6.5 - 8.1 g/dL   Albumin 4.3 3.5 - 5.0 g/dL   AST 15 15 - 41 U/L   ALT 15 0 - 44 U/L   Alkaline Phosphatase 42 38 - 126 U/L   Total Bilirubin 0.8 0.3 - 1.2 mg/dL   GFR calc non Af Amer 10 (L) >60 mL/min   GFR calc Af Amer 12 (L) >60 mL/min   Anion gap 16 (H) 5 - 15    Comment: Performed at [facility], [address] [place], [place] [address]  CBG monitoring, ED     Status: Abnormal   Collection Time: 12/01/18  4:19 PM  Result Value Ref Range   Glucose-Capillary 128 (H) 70 - 99 mg/dL   Dg Chest Portable 1 View  Result Date: 12/01/2018 CLINICAL DATA:  Shortness of breath. EXAM: PORTABLE CHEST 1 VIEW COMPARISON:  Radiograph of November 26, 2013. FINDINGS: Mild cardiomegaly is noted. Interval development of interstitial densities are noted throughout both lungs concerning for pulmonary edema. Small pleural effusions may be present. No pneumothorax is noted. Bony thorax is unremarkable. IMPRESSION: Cardiomegaly is noted with bilateral pulmonary edema and small pleural effusions. Electronically Signed   By: Marijo Conception M.D.   On: 12/01/2018 13:50    Pending Labs [employer] (From admission, onward)    Start     Ordered   Signed and Held  HIV antibody (Routine Testing)  Tomorrow  morning,   R     Signed and Held   Signed and Held  Basic metabolic panel  Daily,   R     Signed and Held          Vitals/Pain Today's Vitals   12/01/18 1530 12/01/18 1545 12/01/18 1600 12/01/18 1615  BP:  (!) 160/93 (!) 165/90 (!) 162/86 (!) 162/87  Pulse: 72 74 74 77  Resp: (!) 23 (!) 24 (!) 24 (!) 22  SpO2: 97% 99% 99% 99%  Weight:      Height:      PainSc:        Isolation Precautions No active isolations  Medications Medications  nitroGLYCERIN 50 mg in dextrose 5 % 250 mL (0.2 mg/mL) infusion (45 mcg/min Intravenous Rate/Dose Change 12/01/18 1623)  ondansetron (ZOFRAN) injection 4 mg (4 mg Intravenous Given 12/01/18 1620)  furosemide (LASIX) injection 80 mg (80 mg Intravenous Given 12/01/18 1358)    Mobility walks with person assist Low fall risk   Focused Assessments Renal Assessment Handoff:  Hemodialysis Schedule: Has not had first dialysis yet. Last Hemodialysis date and time:    Restricted appendage: left arm     R Recommendations: See Admitting Provider Note  Report given to:   Additional Notes: Fistula has not matured yet. Has not had 1st dialysis.

## 2018-12-01 NOTE — H&P (Signed)
History and Physical  Steven Perez ZHY:[id number] DOB: [birth year] DOA: 12/01/2018  Referring physician: Franchot Heidelberg, PA-C, ED provider PCP: Redmond School, MD  Outpatient Specialists: Nephrology: [facility]  Patient Coming From: home  Chief Complaint: SOB, hypoxia  HPI: Steven Perez is a 68 y.o. 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  Patient seen due to shortness of breath this morning.  His shortness of breath has become increasingly worse over as the day progressed.  He called EMS who found him strongly hypoxic with oxygen sats in the mid 80s.  He was placed on nonrebreather brought to the hospital.  His shortness of breath was worse with exertion and improved with rest.  He does have end-stage renal disease and had a fistula placed approximately 2 years ago.  He has not started dialysis yet, but is on the transplant list at [facility].  Emergency Department Course: COVID negative.  Started on BiPAP with rapid improvement.  Patient given 80 of Lasix IV with production of approximately 200 mL's of urine in the span of 2 hours.  Chest x-ray shows pulmonary edema with pleural effusion.  Review of Systems:   Pt denies any fevers, chills, nausea, vomiting, diarrhea, constipation, abdominal pain, palpitations, headache, vision changes, lightheadedness, dizziness, melena, rectal bleeding.  Review of systems are otherwise negative  Past Medical History:  Diagnosis Date   Acute on chronic renal insufficiency    Anemia    assoc w/ chronic reanl failure   Arthralgia    Chronic kidney disease    Stage 4   Diabetes mellitus    Dyslipidemia    Erectile dysfunction due to arterial insufficiency    HLD (hyperlipidemia)    Hypertension    Insomnia    Left renal mass    Obesity    Secondary hyperparathyroidism of renal origin ([place])    Type 2  diabetes mellitus ([place])    Past Surgical History:  Procedure Laterality Date   AV FISTULA PLACEMENT Left 11/02/2016   Procedure: ARTERIOVENOUS (AV) FISTULA CREATION LEFT UPPER ARM;  Surgeon: Elam Dutch, MD;  Location: [facility];  Service: Vascular;  Laterality: Left;   IR GENERIC HISTORICAL  06/04/2014   IR RADIOLOGIST EVAL & MGMT 06/04/2014 Aletta Edouard, MD [facility] INTERV RAD   IR GENERIC HISTORICAL  01/15/2016   IR RADIOLOGIST EVAL & MGMT 01/15/2016 [facility] INTERV RAD   IR RADIOLOGIST EVAL & MGMT  01/25/2017   RENAL BIOPSY     RENAL BIOPSY, PERCUTANEOUS     TONSILLECTOMY     Social History:  reports that he has quit smoking. He has never used smokeless tobacco. He reports that he does not drink alcohol or use drugs. Patient lives at home  Allergies  Allergen Reactions   Contrast Media [Iodinated Diagnostic Agents] Other (See Comments)    Renal failure after administration of CT contrast     Tetanus Toxoids Swelling    "bad reaction;" told not to take this again    Family History  Problem Relation Age of Onset   Diabetes Mellitus II Mother    CAD Neg Hx    Stroke Neg Hx       Prior to Admission medications   Medication Sig Start Date End Date Taking? Authorizing Provider  calcitRIOL (ROCALTROL) 0.25 MCG capsule Take 0.25 mcg by mouth daily.   Yes [provider]  carvedilol (COREG) 25 MG tablet Take 25  mg by mouth 2 (two) times daily.   Yes [provider]  doxazosin (CARDURA) 4 MG tablet Take 4 mg by mouth at bedtime.   Yes [provider]  epoetin alfa (EPOGEN,PROCRIT) 16109 UNIT/ML injection 10,000 Units every 30 (thirty) days.   Yes Elmarie Shiley, MD  epoetin alfa (EPOGEN,PROCRIT) 60454 UNIT/ML injection 20,000 Units every 30 (thirty) days.   Yes [provider]  furosemide (LASIX) 40 MG tablet Take 80 mg by mouth daily.    Yes [provider]  hydrALAZINE (APRESOLINE) 25 MG tablet Take 25 mg by mouth 2 (two) times daily.    Yes [provider]  Insulin Degludec (TRESIBA FLEXTOUCH) 200 UNIT/ML SOPN Inject 20 Units into the skin at bedtime.   Yes [provider]  simvastatin (ZOCOR) 40 MG tablet Take 40 mg by mouth at bedtime.    Yes [provider]  traZODone (DESYREL) 100 MG tablet Take 100 mg by mouth at bedtime.   Yes [provider]  verapamil (CALAN-SR) 180 MG CR tablet Take 2 tablets (360 mg total) by mouth at bedtime. 12/04/13  Yes Short, Noah Delaine, MD  Vitamin D, Ergocalciferol, (DRISDOL) 50000 UNITS CAPS capsule Take 50,000 Units by mouth every Wednesday.    Yes [provider]    Physical Exam: BP (!) 172/83    Pulse 72    Resp 20    Ht 5\' 8"  (1.727 m)    Wt 100 kg    SpO2 98%    BMI 33.52 kg/m    General: Elderly black male. Awake and alert and oriented x3. No acute cardiopulmonary distress.   HEENT: Normocephalic atraumatic.  Right and left ears normal in appearance.  Pupils equal, round, reactive to light. Extraocular muscles are intact. Sclerae anicteric and noninjected.  Moist mucosal membranes. No mucosal lesions.   Neck: Neck supple without lymphadenopathy. No carotid bruits. No masses palpated.   Cardiovascular: Regular rate with normal S1-S2 sounds. No murmurs, rubs, gallops auscultated.  Increased JVD.  Significant 3+ pitting edema in lower extremities bilaterally to knee  Respiratory: Managed breath sounds.  Rales in bases bilaterally.  No accessory muscle use.  Abdomen: Soft, nontender, nondistended. Active bowel sounds. No masses or hepatosplenomegaly   Skin: Thrill present on fistula in left AC.  No rashes, lesions, or ulcerations.  Dry, warm to touch. 2+ dorsalis pedis and radial pulses.  Musculoskeletal: No calf or leg pain. All major joints not erythematous nontender.  No upper or lower joint deformation.  Good ROM.  No contractures   Psychiatric: Intact judgment and insight. Pleasant and cooperative.  Neurologic: No focal neurological  deficits. Strength is 5/5 and symmetric in upper and lower extremities.  Cranial nerves II through XII are grossly intact.           Labs on Admission: I have personally reviewed following labs and imaging studies  CBC: Recent Labs  Lab 11/30/18 0741 11/30/18 0749 12/01/18 1330  WBC 9.1  --  10.0  NEUTROABS 7.4  --  9.3*  HGB 11.2* 11.0* 12.0*  HCT 35.5*  --  38.4*  MCV 90.3  --  91.2  PLT 226  --  098   Basic Metabolic Panel: Recent Labs  Lab 11/30/18 0741 12/01/18 1330  NA 137 141  K 3.7 4.1  CL 99 101  CO2 22 24  GLUCOSE 93 196*  BUN 55* 61*  CREATININE 5.05* 5.29*  CALCIUM 9.0   9.1 9.2  MG 2.1  --   PHOS  4.6  --    GFR: Estimated Creatinine Clearance: 15.3 mL/min (A) (by C-G formula based on SCr of 5.29 mg/dL (H)). Liver Function Tests: Recent Labs  Lab 11/30/18 0741 12/01/18 1330  AST  --  15  ALT  --  15  ALKPHOS  --  42  BILITOT  --  0.8  PROT  --  8.1  ALBUMIN 3.9 4.3   No results for input(s): LIPASE, AMYLASE in the last 168 hours. No results for input(s): AMMONIA in the last 168 hours. Coagulation Profile: No results for input(s): INR, PROTIME in the last 168 hours. Cardiac Enzymes: No results for input(s): CKTOTAL, CKMB, CKMBINDEX, TROPONINI in the last 168 hours. BNP (last 3 results) No results for input(s): PROBNP in the last 8760 hours. HbA1C: No results for input(s): HGBA1C in the last 72 hours. CBG: No results for input(s): GLUCAP in the last 168 hours. Lipid Profile: No results for input(s): CHOL, HDL, LDLCALC, TRIG, CHOLHDL, LDLDIRECT in the last 72 hours. Thyroid Function Tests: No results for input(s): TSH, T4TOTAL, FREET4, T3FREE, THYROIDAB in the last 72 hours. Anemia Panel: Recent Labs    11/30/18 0741  FERRITIN 589*  TIBC 245*  IRON 43*   Urine analysis:    Component Value Date/Time   COLORURINE YELLOW 11/26/2013 [place] 11/26/2013 1849   LABSPEC 1.016 11/26/2013 1849   PHURINE 5.0 11/26/2013  1849   GLUCOSEU NEGATIVE 11/26/2013 1849   HGBUR NEGATIVE 11/26/2013 1849   BILIRUBINUR NEGATIVE 11/26/2013 1849   KETONESUR NEGATIVE 11/26/2013 1849   PROTEINUR NEGATIVE 11/26/2013 1849   UROBILINOGEN 0.2 11/26/2013 1849   NITRITE NEGATIVE 11/26/2013 1849   LEUKOCYTESUR TRACE (A) 11/26/2013 1849   Sepsis Labs: @LABRCNTIP (procalcitonin:4,lacticidven:4) ) Recent Results (from the past 240 hour(s))  SARS Coronavirus 2 (CEPHEID - Performed in [facility] hospital lab), Hosp Order     Status: None   Collection Time: 12/01/18  1:27 PM   Specimen: Nasopharyngeal Swab  Result Value Ref Range Status   SARS Coronavirus 2 NEGATIVE NEGATIVE Final    Comment: (NOTE) If result is NEGATIVE SARS-CoV-2 target nucleic acids are NOT DETECTED. The SARS-CoV-2 RNA is generally detectable in upper and lower  respiratory specimens during the acute phase of infection. The lowest  concentration of SARS-CoV-2 viral copies this assay can detect is 250  copies / mL. A negative result does not preclude SARS-CoV-2 infection  and should not be used as the sole basis for treatment or other  patient management decisions.  A negative result may occur with  improper specimen collection / handling, submission of specimen other  than nasopharyngeal swab, presence of viral mutation(s) within the  areas targeted by this assay, and inadequate number of viral copies  (<250 copies / mL). A negative result must be combined with clinical  observations, patient history, and epidemiological information. If result is POSITIVE SARS-CoV-2 target nucleic acids are DETECTED. The SARS-CoV-2 RNA is generally detectable in upper and lower  respiratory specimens dur ing the acute phase of infection.  Positive  results are indicative of active infection with SARS-CoV-2.  Clinical  correlation with patient history and other diagnostic information is  necessary to determine patient infection status.  Positive results do  not rule  out bacterial infection or co-infection with other viruses. If result is PRESUMPTIVE POSTIVE SARS-CoV-2 nucleic acids MAY BE PRESENT.   A presumptive positive result was obtained on the submitted specimen  and confirmed on repeat testing.  While 2019 novel coronavirus  (  SARS-CoV-2) nucleic acids may be present in the submitted sample  additional confirmatory testing may be necessary for epidemiological  and / or clinical management purposes  to differentiate between  SARS-CoV-2 and other Sarbecovirus currently known to infect humans.  If clinically indicated additional testing with an alternate test  methodology [phone]) is advised. The SARS-CoV-2 RNA is generally  detectable in upper and lower respiratory sp ecimens during the acute  phase of infection. The expected result is Negative. Fact Sheet for Patients:  StrictlyIdeas.no Fact Sheet for Healthcare Providers: BankingDealers.co.za This test is not yet approved or cleared by the [place] FDA and has been authorized for detection and/or diagnosis of SARS-CoV-2 by FDA under an Emergency Use Authorization (EUA).  This EUA will remain in effect (meaning this test can be used) for the duration of the COVID-19 declaration under Section 564(b)(1) of the Act, 21 U.S.C. section 360bbb-3(b)(1), unless the authorization is terminated or revoked sooner. Performed at [facility], [address], [place], [place] [address]      Radiological Exams on Admission: Dg Chest Portable 1 View  Result Date: 12/01/2018 CLINICAL DATA:  Shortness of breath. EXAM: PORTABLE CHEST 1 VIEW COMPARISON:  Radiograph of November 26, 2013. FINDINGS: Mild cardiomegaly is noted. Interval development of interstitial densities are noted throughout both lungs concerning for pulmonary edema. Small pleural effusions may be present. No pneumothorax is noted. Bony thorax is unremarkable. IMPRESSION: Cardiomegaly is noted with  bilateral pulmonary edema and small pleural effusions. Electronically Signed   By: Marijo Conception M.D.   On: 12/01/2018 13:50    EKG: Independently reviewed.  Sinus rhythm with left atrial enlargement and baseline wander.  No acute ST changes.  Assessment/Plan: Active Problems:   Diabetes mellitus (HCC)   HTN (hypertension)   CKD (chronic kidney disease) stage 5, GFR less than 15 ml/min (HCC)   Acute respiratory failure with hypoxia and hypercapnia (HCC)   Acute systolic CHF (congestive heart failure) ([place])    This patient was discussed with the ED physician, including pertinent vitals, physical exam findings, labs, and imaging.  We also discussed care given by the ED provider.  1. Acute respiratory failure with hypoxia and hypercapnia a. Patient on BiPAP b. We will admit to stepdown c. With patient being on BiPAP and significant hypoxia, the patient qualifies for full admit as the patient will need more than 48 hours for diuresis, dialysis, and improvement of respiratory status.   2. Acute systolic heart failure Telemetry monitoring Strict I/O Daily Weights Diuresis: Lasix 160 mg every 6 hours Potassium: 40 mEq 3 times a day by mouth Echo cardiac exam tomorrow Repeat BMP tomorrow 3. End-stage renal disease a. I discussed this patient with Dr. Justin Mend from nephrology who recommended continuing with BiPAP and diuresis overnight.  He will evaluate the patient tomorrow for dialysis b. We will consult 4. Hypertension a. Continue with nitroglycerin drip and titrate 5. Diabetes a. Continue home long-acting insulin b. Sliding scale insulin and CBGs before meals and nightly  DVT prophylaxis: Heparin Consultants: Nephrology Code Status: No code Family Communication: None Disposition Plan: Patient should be able to return home following improvement of respiratory status.   Truett Mainland, DO

## 2018-12-01 NOTE — ED Provider Notes (Addendum)
Medical screening examination/treatment/procedure(s) were conducted as a shared visit with non-physician practitioner(s) and myself.  I personally evaluated the patient during the encounter.  EKG Interpretation  Date/Time:  Friday December 01 2018 13:15:02 EDT Ventricular Rate:  83 PR Interval:    QRS Duration: 121 QT Interval:  423 QTC Calculation: 498 R Axis:   2 Text Interpretation:  Sinus rhythm Probable left atrial enlargement Nonspecific intraventricular conduction delay Baseline wander in lead(s) V6 Confirmed by Fredia Sorrow [id number]) on 12/01/2018 1:39:14 PM   Patient seen by me along with physician assistant.  Patient with sudden onset of shortness of breath and weakness today actually listed about an hour ago.  With difficulties breathing.  Patient is in end-stage renal disease but has not started dialysis yet.  Does have a fistula.  Patient was noted to be diaphoretic.  On room air sats were 84%.  Patient was brought in by EMS on a non-rebreather.  His sats were 95 for that.  Patient was given albuterol in route by EMS.  Patient denies any fever cough or chest pain.  Patient definitely in respiratory distress but able to talk and mentating.  On 5 L nasal cannula oxygen sats were in the mid to upper 90s.  But he was working hard to breathe with tachypnea.  Blood pressure fine.  Chest x-ray portable chest x-ray consistent with pulmonary edema.  This was confirmed by radiology.  Also showed some pleural effusions.  Patient blood pressure was markedly elevated with systolic of 382.  Based on these blood pressure findings and chest x-ray findings patient started on nitroglycerin drip and given 80 mg of Lasix.  Condom cath was placed.  Patient's blood pressure did improve was able to get it currently down to 63 and they are still titrating.  Patient did P2 100 cc of urine.  Patient's BUN and creatinine is baseline.  Potassium not elevated.  Discussed with patient starting him on BiPAP since  COVID is negative.  Patient wanted to try the BiPAP in order to rest him some.  Patient will require admission.  Be up to the hospitalist but they want admit to [facility] in case he ends up needing dialysis over the weekend.  Overall patient improving.  Patient's blood gas did show some acidosis and slightly elevated PCO2.  In addition patient's EKG without any acute changes.  Patient denied any chest pain.  Not concerned about an acute cardiac event.  Feel that this is just volume overload.  Pulmonary edema and compounded by elevated blood pressure.   CRITICAL CARE Performed by: Fredia Sorrow Total critical care time: 30 minutes Critical care time was exclusive of separately billable procedures and treating other patients. Critical care was necessary to treat or prevent imminent or life-threatening deterioration. Critical care was time spent personally by me on the following activities: development of treatment plan with patient and/or surrogate as well as nursing, discussions with consultants, evaluation of patient's response to treatment, examination of patient, obtaining history from patient or surrogate, ordering and performing treatments and interventions, ordering and review of laboratory studies, ordering and review of radiographic studies, pulse oximetry and re-evaluation of patient's condition.    Fredia Sorrow, MD 12/01/18 [id number]    Fredia Sorrow, MD 12/01/18 [phone]

## 2018-12-01 NOTE — ED Provider Notes (Signed)
[facility] Provider Note   CSN: [id number] Arrival date & time: 12/01/18  1311    History   Chief Complaint Chief Complaint  Patient presents with  . Shortness of Breath    HPI Steven Perez is a 68 y.o. male presenting for respiratory distress.   Level V caveat due to respiratory distress and critically ill pt.   Patient states he started to feel short of breath today.  He denies fevers, cough, chest pain.  Patient states he takes Lasix, has been taking it as prescribed and had a normal urine output.  Reports mild worsening of bilateral lower extremity edema.  Denies any recent medications.  He had an EPO shot yesterday, but no transfusion.  Additional history from EMS.  Upon arrival per EMS, patient was found to be in respiratory distress with sats of 84%, cool and diaphoretic.  He was placed on a nonrebreather, sats improved.  Patient placed on nasal cannula upon arrival to the ED.  Additional history obtained from chart review.  Patient with a history of ESRD with fistula placement but no dialysis started. H/o DM, HTN, HLD, chronic anemia.      HPI  Past Medical History:  Diagnosis Date  . Acute on chronic renal insufficiency   . Anemia    assoc w/ chronic reanl failure  . Arthralgia   . Chronic kidney disease    Stage 4  . Diabetes mellitus   . Dyslipidemia   . Erectile dysfunction due to arterial insufficiency   . HLD (hyperlipidemia)   . Hypertension   . Insomnia   . Left renal mass   . Obesity   . Secondary hyperparathyroidism of renal origin ([place])   . Type 2 diabetes mellitus [facility])     Patient Active Problem List   Diagnosis Date Noted  . Left renal mass   . Pre-transplant evaluation for CKD (chronic kidney disease) 06/19/2014  . Obesity 06/19/2014  . Hyperlipidemia 06/19/2014  . Complex renal cyst 06/19/2014  . CKD (chronic kidney disease) stage 5, GFR less than 15 ml/min (HCC) 06/19/2014  . Erectile dysfunction 06/19/2014  .  Renal failure (ARF), acute on chronic (HCC) 11/27/2013  . Diabetes mellitus ([place]) 11/27/2013  . HTN (hypertension) 11/27/2013  . Anemia 11/27/2013  . Metabolic acidosis 54/27/0623  . Acute renal failure ([place]) 11/27/2013  . AKI (acute kidney injury) ([place]) 11/26/2013    Past Surgical History:  Procedure Laterality Date  . AV FISTULA PLACEMENT Left 11/02/2016   Procedure: ARTERIOVENOUS (AV) FISTULA CREATION LEFT UPPER ARM;  Surgeon: Elam Dutch, MD;  Location: [place];  Service: Vascular;  Laterality: Left;  . IR GENERIC HISTORICAL  06/04/2014   IR RADIOLOGIST EVAL & MGMT 06/04/2014 Aletta Edouard, MD [facility] INTERV RAD  . IR GENERIC HISTORICAL  01/15/2016   IR RADIOLOGIST EVAL & MGMT 01/15/2016 [facility] INTERV RAD  . IR RADIOLOGIST EVAL & MGMT  01/25/2017  . RENAL BIOPSY    . RENAL BIOPSY, PERCUTANEOUS    . TONSILLECTOMY          Home Medications    Prior to Admission medications   Medication Sig Start Date End Date Taking? Authorizing Provider  calcitRIOL (ROCALTROL) 0.25 MCG capsule Take 0.25 mcg by mouth daily.   Yes [provider]  carvedilol (COREG) 25 MG tablet Take 25 mg by mouth 2 (two) times daily.   Yes [provider]  doxazosin (CARDURA) 4 MG tablet Take 4 mg by mouth at bedtime.   Yes  [provider]  epoetin alfa (EPOGEN,PROCRIT) 75102 UNIT/ML injection 10,000 Units every 30 (thirty) days.   Yes Elmarie Shiley, MD  epoetin alfa (EPOGEN,PROCRIT) 58527 UNIT/ML injection 20,000 Units every 30 (thirty) days.   Yes [provider]  furosemide (LASIX) 40 MG tablet Take 80 mg by mouth daily.    Yes [provider]  hydrALAZINE (APRESOLINE) 25 MG tablet Take 25 mg by mouth 2 (two) times daily.   Yes [provider]  Insulin Degludec (TRESIBA FLEXTOUCH) 200 UNIT/ML SOPN Inject 20 Units into the skin at bedtime.   Yes [provider]  simvastatin (ZOCOR) 40 MG tablet Take 40 mg by mouth at bedtime.    Yes [provider]  traZODone (DESYREL) 100 MG tablet Take 100 mg by mouth at bedtime.   Yes [provider]  verapamil (CALAN-SR) 180 MG CR tablet Take 2 tablets (360 mg total) by mouth at bedtime. 12/04/13  Yes Short, Noah Delaine, MD  Vitamin D, Ergocalciferol, (DRISDOL) 50000 UNITS CAPS capsule Take 50,000 Units by mouth every Wednesday.    Yes [provider]    Family History Family History  Problem Relation Age of Onset  . Diabetes Mellitus II Mother   . CAD Neg Hx   . Stroke Neg Hx     Social History Social History   Tobacco Use  . Smoking status: Former Research scientist (life sciences)  . Smokeless tobacco: Never Used  Substance Use Topics  . Alcohol use: No    Comment: occasionally  . Drug use: No     Allergies   Contrast media [iodinated diagnostic agents] and Tetanus toxoids   Review of Systems Review of Systems  Unable to perform ROS: Acuity of condition  Respiratory: Positive for shortness of breath.   Cardiovascular: Positive for leg swelling.     Physical Exam Updated Vital Signs BP (!) 172/83   Pulse 72   Resp 20   Ht 5\' 8"  (1.727 m)   Wt 100 kg   SpO2 98%   BMI 33.52 kg/m   Physical Exam Vitals signs and nursing note reviewed.  Constitutional:      Comments: Pt in respiratory distress. Cool and diaphoretic. Pt is acutely ill.  HENT:     Head: Normocephalic and atraumatic.  Eyes:     Conjunctiva/sclera: Conjunctivae normal.     Pupils: Pupils are equal, round, and reactive to light.  Neck:     Musculoskeletal: Normal range of motion and neck supple.  Cardiovascular:     Rate and Rhythm: Normal rate and regular rhythm.     Pulses: Normal pulses.  Pulmonary:     Effort: Tachypnea and respiratory distress present.     Breath sounds: Rhonchi and rales present.     Comments: Pt with obvious accessory muscle use. tachypnic around 67. Rhonchi/rales hear throughout.  Abdominal:     General: There is no distension.     Palpations: Abdomen is soft. There is  no mass.     Tenderness: There is no abdominal tenderness. There is no guarding or rebound.  Musculoskeletal: Normal range of motion.     Right lower leg: Edema present.     Left lower leg: Edema present.     Comments: Mild bilateral leg swelling  Skin:    General: Skin is warm and dry.     Capillary Refill: Capillary refill takes less than 2 seconds.  Neurological:     Mental Status: He is oriented to person, place, and time.  Comments: A&Ox3. Pt able to answer questions approprietly, although slightly somnolent between questions.       ED Treatments / Results  Labs (all labs ordered are listed, but only abnormal results are displayed) Labs Reviewed  CBC WITH DIFFERENTIAL/PLATELET - Abnormal; Notable for the following components:      Result Value   RBC 4.21 (*)    Hemoglobin 12.0 (*)    HCT 38.4 (*)    Neutro Abs 9.3 (*)    Lymphs Abs 0.4 (*)    Abs Immature Granulocytes 0.08 (*)    All other components within normal limits  COMPREHENSIVE METABOLIC PANEL - Abnormal; Notable for the following components:   Glucose, Bld 196 (*)    BUN 61 (*)    Creatinine, Ser 5.29 (*)    GFR calc non Af Amer 10 (*)    GFR calc Af Amer 12 (*)    Anion gap 16 (*)    All other components within normal limits  BLOOD GAS, ARTERIAL - Abnormal; Notable for the following components:   pH, Arterial 7.270 (*)    pCO2 arterial 54.7 (*)    Allens test (pass/fail) RIGHT RADIAL (*)    All other components within normal limits  SARS CORONAVIRUS 2 (HOSPITAL ORDER, [place] [facility])    EKG EKG Interpretation  Date/Time:  Friday December 01 2018 13:15:02 EDT Ventricular Rate:  83 PR Interval:    QRS Duration: 121 QT Interval:  423 QTC Calculation: 498 R Axis:   2 Text Interpretation:  Sinus rhythm Probable left atrial enlargement Nonspecific intraventricular conduction delay Baseline wander in lead(s) V6 Confirmed by Fredia Sorrow [phone]) on 12/01/2018 1:39:14 PM    Radiology Dg Chest Portable 1 View  Result Date: 12/01/2018 CLINICAL DATA:  Shortness of breath. EXAM: PORTABLE CHEST 1 VIEW COMPARISON:  Radiograph of November 26, 2013. FINDINGS: Mild cardiomegaly is noted. Interval development of interstitial densities are noted throughout both lungs concerning for pulmonary edema. Small pleural effusions may be present. No pneumothorax is noted. Bony thorax is unremarkable. IMPRESSION: Cardiomegaly is noted with bilateral pulmonary edema and small pleural effusions. Electronically Signed   By: Marijo Conception M.D.   On: 12/01/2018 13:50    Procedures .Critical Care Performed by: Franchot Heidelberg, PA-C Authorized by: Franchot Heidelberg, PA-C   Critical care provider statement:    Critical care time (minutes):  50   Critical care time was exclusive of:  Separately billable procedures and treating other patients and teaching time   Critical care was necessary to treat or prevent imminent or life-threatening deterioration of the following conditions:  Respiratory failure   Critical care was time spent personally by me on the following activities:  Development of treatment plan with patient or surrogate, blood draw for specimens, evaluation of patient's response to treatment, examination of patient, obtaining history from patient or surrogate, ordering and performing treatments and interventions, ordering and review of laboratory studies, ordering and review of radiographic studies, pulse oximetry, re-evaluation of patient's condition and review of old charts   I assumed direction of critical care for this patient from another provider in my specialty: no   Comments:     Pt presenting in respiratory distress. cxr shows pulmonary edema, lasix and nitro gtt started. bipap started for continued tachypnea and accessory muscle use. Pt admitted.    (including critical care time)  Medications Ordered in ED Medications  nitroGLYCERIN 50 mg in dextrose 5 % 250 mL (0.2  mg/mL) infusion (40  mcg/min Intravenous Rate/Dose Change 12/01/18 1512)  furosemide (LASIX) injection 80 mg (80 mg Intravenous Given 12/01/18 1358)     Initial Impression / Assessment and Plan / ED Course  I have reviewed the triage vital signs and the nursing notes.  Pertinent labs & imaging results that were available during my care of the patient were reviewed by me and considered in my medical decision making (see chart for details).        Patient presenting for shortness of breath.  Upon arrival, patient respiratory distress.  Obvious accessory muscle use.  EMS found patient hypoxic and diaphoretic.  On my exam, patient remains diaphoretic, although hypoxia improved with supplemental oxygen.  Pulmonary exam concerning, throughout.  Patient also with bilateral leg swelling.  High suspicion for decompensating CHF, socially considering patient is ESRD but not started dialysis.  However, also consider coronavirus versus pneumonia versus PE versus ACS.  Will obtain labs, x-ray, COVID.  Case discussed with attending, Dr. Bobby Rumpf evaluated the patient.  X-ray viewed interpreted by me, shows cardiomegaly with pulmonary edema.  Radiologist, also has pleural effusion.  Will start Lasix and nitro gtt.  EKG without STEMI.  As patient was also had chest pain, doubt ACS at this time.  Labs show elevated creatinine at 5.29, but not far from patient's baseline around 4.8.  ABG shows mild acidosis at 7.2.  Hemoglobin stable.  COVID pending.  COVID negative.  On reassessment, patient reports breathing is improved, although he still has times when he feels very short of breath.  He remains on oxygen, sats are been stable.  Patient remains tachypneic and with accessory muscle use.  As such, will place on BiPAP to prevent decompensation.  Blood pressure improving with nitro titration.  Patient with 200 cc urine output, indicating that he is able to produce urine still.  As he has  Improved slightly and remained  stable, I do not feel he needs emergent transfer to [place] for emergent dialysis at this time. Will call for admission.  Discussed with Dr. Nehemiah Settle, pt to be admitted.   Final Clinical Impressions(s) / ED Diagnoses   Final diagnoses:  Acute decompensated heart failure [facility])    ED Discharge Orders    None       Franchot Heidelberg, PA-C 12/01/18 1531    Fredia Sorrow, MD 12/09/18 1624

## 2018-12-02 ENCOUNTER — Inpatient Hospital Stay (HOSPITAL_COMMUNITY): Payer: Medicare Other

## 2018-12-02 DIAGNOSIS — I5021 Acute systolic (congestive) heart failure: Secondary | ICD-10-CM

## 2018-12-02 LAB — BASIC METABOLIC PANEL
Anion gap: 14 (ref 5–15)
BUN: 63 mg/dL — ABNORMAL HIGH (ref 8–23)
CO2: 22 mmol/L (ref 22–32)
Calcium: 9.3 mg/dL (ref 8.9–10.3)
Chloride: 104 mmol/L (ref 98–111)
Creatinine, Ser: 5.04 mg/dL — ABNORMAL HIGH (ref 0.61–1.24)
GFR calc Af Amer: 13 mL/min — ABNORMAL LOW (ref >60)
GFR calc non Af Amer: 11 mL/min — ABNORMAL LOW (ref >60)
Glucose, Bld: 152 mg/dL — ABNORMAL HIGH (ref 70–99)
Potassium: 4.4 mmol/L (ref 3.5–5.1)
Sodium: 140 mmol/L (ref 135–145)

## 2018-12-02 LAB — TSH: TSH: 1.429 u[IU]/mL (ref 0.350–4.500)

## 2018-12-02 LAB — GLUCOSE, CAPILLARY
Glucose-Capillary: 129 mg/dL — ABNORMAL HIGH (ref 70–99)
Glucose-Capillary: 184 mg/dL — ABNORMAL HIGH (ref 70–99)
Glucose-Capillary: 137 mg/dL — ABNORMAL HIGH (ref 70–99)
Glucose-Capillary: 149 mg/dL — ABNORMAL HIGH (ref 70–99)

## 2018-12-02 LAB — ECHOCARDIOGRAM COMPLETE
Height: 68 in
Weight: 3604.96 oz

## 2018-12-02 MED ORDER — METOLAZONE 5 MG PO TABS
10.0000 mg | ORAL_TABLET | Freq: Every day | ORAL | Status: DC
  Administered 2018-12-02 – 2018-12-03 (×2): 10 mg via ORAL
  Filled 2018-12-02 (×3): qty 2

## 2018-12-02 MED ORDER — PROMETHAZINE HCL 25 MG/ML IJ SOLN
12.5000 mg | Freq: Once | INTRAMUSCULAR | Status: DC
  Filled 2018-12-02: qty 1

## 2018-12-02 MED ORDER — POLYETHYLENE GLYCOL 3350 17 G PO PACK
17.0000 g | PACK | Freq: Every day | ORAL | Status: DC
  Administered 2018-12-02 – 2018-12-07 (×6): 17 g via ORAL
  Filled 2018-12-02 (×6): qty 1

## 2018-12-02 MED ORDER — PROMETHAZINE HCL 25 MG/ML IJ SOLN
12.5000 mg | Freq: Once | INTRAMUSCULAR | Status: AC
  Administered 2018-12-02: 12.5 mg via INTRAVENOUS
  Filled 2018-12-02: qty 1

## 2018-12-02 MED ORDER — HYDRALAZINE HCL 20 MG/ML IJ SOLN: 10.0000 mg | INTRAMUSCULAR | Status: DC | PRN

## 2018-12-02 NOTE — Progress Notes (Signed)
*  PRELIMINARY RESULTS* Echocardiogram 2D Echocardiogram has been performed.  Steven Perez 12/02/2018, 9:30 AM

## 2018-12-02 NOTE — Progress Notes (Addendum)
PROGRESS NOTE    Steven Perez  OFB:[id number] DOB: [birth year] DOA: 12/01/2018 PCP: Redmond School, MD   Brief Narrative:  Per HPI: Steven Perez is a 68 y.o. 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  Patient seen due to shortness of breath this morning.  His shortness of breath has become increasingly worse over as the day progressed.  He called EMS who found him strongly hypoxic with oxygen sats in the mid 80s.  He was placed on nonrebreather brought to the hospital.  His shortness of breath was worse with exertion and improved with rest.  He does have end-stage renal disease and had a fistula placed approximately 2 years ago.  He has not started dialysis yet, but is on the transplant list at [facility].  Patient was admitted with acute hypercapnic and hypoxemic respiratory failure secondary to acute on chronic systolic congestive heart failure in the setting of end-stage renal disease.  He has been started on aggressive IV Lasix dosing with some diuresis noted thus far.  Assessment & Plan:   Active Problems:   Diabetes mellitus (HCC)   HTN (hypertension)   CKD (chronic kidney disease) stage 5, GFR less than 15 ml/min (HCC)   Acute respiratory failure with hypoxia and hypercapnia (HCC)   Acute systolic CHF (congestive heart failure) (HCC)   Acute combined respiratory failure secondary to acute on chronic systolic congestive heart failure exacerbation with volume overload -Wean nasal cannula oxygen as tolerated -Wean nitroglycerin per SBP less than 160 and DBP less than 100 -Continue Lasix 160 mg every 6 hours with metolazone added by nephrology -Continue stepdown monitoring -2D echocardiogram pending -TSH ordered -Strict I's and O's and daily weights -Repeat BMP in a.m.  ESRD -Nephrology following and will initiate dialysis as needed -Follow labs and  continue aggressive IV diuresis for now  Hypertension -Wean nitroglycerin as tolerated per parameters noted above  Diabetes -Blood glucose readings currently stable -Continue SSI and long-acting insulin -Carb modified diet   DVT prophylaxis: Heparin Code Status: Full Family Communication: None at bedside Disposition Plan: [place] nephrology recommendations with addition of metolazone.  Continue aggressive IV Lasix diuresis and consider hemodialysis as needed.  Wean nitroglycerin as tolerated.  Patient requires inpatient stay due to IV Lasix use and blood pressure control on IV nitroglycerin drip.   Consultants:   Nephrology  Procedures:   None  Antimicrobials:   None   Subjective: Patient seen and evaluated today with some nausea and weakness noted this morning.  No acute concerns or events noted overnight.  He appears to be diuresing well with about 1.1 L net negative fluid balance noted.  Objective: Vitals:   12/02/18 0400 12/02/18 0450 12/02/18 0610 12/02/18 0809  BP: (!) 168/79  (!) 163/84   Pulse: 81  76 79  Resp: (!) 28  18 (!) 29  Temp:  99.1 F (37.3 C)  99.1 F (37.3 C)  TempSrc:  Oral  Oral  SpO2: 97%  98% 98%  Weight:  102.2 kg    Height:        Intake/Output Summary (Last 24 hours) at 12/02/2018 1122 Last data filed at 12/02/2018 0400 Gross per 24 hour  Intake 280.24 ml  Output 1450 ml  Net -1169.76 ml   Filed Weights   12/01/18 1316 12/01/18 1721 12/02/18 0450  Weight: 100 kg 103.1 kg 102.2 kg    Examination:  General exam: Appears  calm and comfortable  Respiratory system: Clear to auscultation. Respiratory effort normal.  On nasal cannula oxygen. Cardiovascular system: S1 & S2 heard, RRR. No JVD, murmurs, rubs, gallops or clicks. No pedal edema. Gastrointestinal system: Abdomen is nondistended, soft and nontender. No organomegaly or masses felt. Normal bowel sounds heard. Central nervous system: Alert and oriented. No focal neurological  deficits. Extremities: Symmetric 5 x 5 power. Skin: No rashes, lesions or ulcers Psychiatry: Judgement and insight appear normal. Mood & affect appropriate.     Data Reviewed: I have personally reviewed following labs and imaging studies  CBC: Recent Labs  Lab 11/30/18 0741 11/30/18 0749 12/01/18 1330  WBC 9.1  --  10.0  NEUTROABS 7.4  --  9.3*  HGB 11.2* 11.0* 12.0*  HCT 35.5*  --  38.4*  MCV 90.3  --  91.2  PLT 226  --  160   Basic Metabolic Panel: Recent Labs  Lab 11/30/18 0741 12/01/18 1330 12/02/18 0426  NA 137 141 140  K 3.7 4.1 4.4  CL 99 101 104  CO2 22 24 22   GLUCOSE 93 196* 152*  BUN 55* 61* 63*  CREATININE 5.05* 5.29* 5.04*  CALCIUM 9.0  9.1 9.2 9.3  MG 2.1  --   --   PHOS 4.6  --   --    GFR: Estimated Creatinine Clearance: 16.3 mL/min (A) (by C-G formula based on SCr of 5.04 mg/dL (H)). Liver Function Tests: Recent Labs  Lab 11/30/18 0741 12/01/18 1330  AST  --  15  ALT  --  15  ALKPHOS  --  42  BILITOT  --  0.8  PROT  --  8.1  ALBUMIN 3.9 4.3   No results for input(s): LIPASE, AMYLASE in the last 168 hours. No results for input(s): AMMONIA in the last 168 hours. Coagulation Profile: No results for input(s): INR, PROTIME in the last 168 hours. Cardiac Enzymes: No results for input(s): CKTOTAL, CKMB, CKMBINDEX, TROPONINI in the last 168 hours. BNP (last 3 results) No results for input(s): PROBNP in the last 8760 hours. HbA1C: No results for input(s): HGBA1C in the last 72 hours. CBG: Recent Labs  Lab 12/01/18 1619 12/01/18 1726 12/01/18 2028 12/02/18 0808  GLUCAP 128* 120* 123* 129*   Lipid Profile: No results for input(s): CHOL, HDL, LDLCALC, TRIG, CHOLHDL, LDLDIRECT in the last 72 hours. Thyroid Function Tests: No results for input(s): TSH, T4TOTAL, FREET4, T3FREE, THYROIDAB in the last 72 hours. Anemia Panel: Recent Labs    11/30/18 0741  FERRITIN 589*  TIBC 245*  IRON 43*   Sepsis Labs: No results for input(s):  PROCALCITON, LATICACIDVEN in the last 168 hours.  Recent Results (from the past 240 hour(s))  SARS Coronavirus 2 (CEPHEID - Performed in [place] hospital lab), Hosp Order     Status: None   Collection Time: 12/01/18  1:27 PM   Specimen: Nasopharyngeal Swab  Result Value Ref Range Status   SARS Coronavirus 2 NEGATIVE NEGATIVE Final    Comment: (NOTE) If result is NEGATIVE SARS-CoV-2 target nucleic acids are NOT DETECTED. The SARS-CoV-2 RNA is generally detectable in upper and lower  respiratory specimens during the acute phase of infection. The lowest  concentration of SARS-CoV-2 viral copies this assay can detect is 250  copies / mL. A negative result does not preclude SARS-CoV-2 infection  and should not be used as the sole basis for treatment or other  patient management decisions.  A negative result may occur with  improper specimen collection /  handling, submission of specimen other  than nasopharyngeal swab, presence of viral mutation(s) within the  areas targeted by this assay, and inadequate number of viral copies  (<250 copies / mL). A negative result must be combined with clinical  observations, patient history, and epidemiological information. If result is POSITIVE SARS-CoV-2 target nucleic acids are DETECTED. The SARS-CoV-2 RNA is generally detectable in upper and lower  respiratory specimens dur ing the acute phase of infection.  Positive  results are indicative of active infection with SARS-CoV-2.  Clinical  correlation with patient history and other diagnostic information is  necessary to determine patient infection status.  Positive results do  not rule out bacterial infection or co-infection with other viruses. If result is PRESUMPTIVE POSTIVE SARS-CoV-2 nucleic acids MAY BE PRESENT.   A presumptive positive result was obtained on the submitted specimen  and confirmed on repeat testing.  While 2019 novel coronavirus  (SARS-CoV-2) nucleic acids may be present in  the submitted sample  additional confirmatory testing may be necessary for epidemiological  and / or clinical management purposes  to differentiate between  SARS-CoV-2 and other Sarbecovirus currently known to infect humans.  If clinically indicated additional testing with an alternate test  methodology [phone]) is advised. The SARS-CoV-2 RNA is generally  detectable in upper and lower respiratory sp ecimens during the acute  phase of infection. The expected result is Negative. Fact Sheet for Patients:  StrictlyIdeas.no Fact Sheet for Healthcare Providers: BankingDealers.co.za This test is not yet approved or cleared by the [place] FDA and has been authorized for detection and/or diagnosis of SARS-CoV-2 by FDA under an Emergency Use Authorization (EUA).  This EUA will remain in effect (meaning this test can be used) for the duration of the COVID-19 declaration under Section 564(b)(1) of the Act, 21 U.S.C. section 360bbb-3(b)(1), unless the authorization is terminated or revoked sooner. Performed at [facility], [address] [place], [place] [address]   MRSA PCR Screening     Status: None   Collection Time: 12/01/18  5:22 PM   Specimen: Nasal Mucosa; Nasopharyngeal  Result Value Ref Range Status   MRSA by PCR NEGATIVE NEGATIVE Final    Comment:        The GeneXpert MRSA Assay (FDA approved for NASAL specimens only), is one component of a comprehensive MRSA colonization surveillance program. It is not intended to diagnose MRSA infection nor to guide or monitor treatment for MRSA infections. Performed at [facility], [address] [place], [place] [address]          Radiology Studies: Dg Chest Portable 1 View  Result Date: 12/01/2018 CLINICAL DATA:  Shortness of breath. EXAM: PORTABLE CHEST 1 VIEW COMPARISON:  Radiograph of November 26, 2013. FINDINGS: Mild cardiomegaly is noted. Interval development of interstitial  densities are noted throughout both lungs concerning for pulmonary edema. Small pleural effusions may be present. No pneumothorax is noted. Bony thorax is unremarkable. IMPRESSION: Cardiomegaly is noted with bilateral pulmonary edema and small pleural effusions. Electronically Signed   By: Marijo Conception M.D.   On: 12/01/2018 13:50        Scheduled Meds: . atorvastatin  20 mg Oral QHS  . calcitRIOL  0.25 mcg Oral Daily  . doxazosin  4 mg Oral QHS  . heparin  5,000 Units Subcutaneous Q8H  . hydrALAZINE  25 mg Oral BID  . insulin aspart  0-15 Units Subcutaneous TID WC  . insulin aspart  0-5 Units Subcutaneous QHS  . insulin glargine  20 Units Subcutaneous QHS  . metolazone  10 mg Oral Daily  . polyethylene glycol  17 g Oral Daily  . potassium chloride  40 mEq Oral TID  . sodium chloride flush  3 mL Intravenous Q12H  . traZODone  100 mg Oral QHS  . verapamil  360 mg Oral QHS   Continuous Infusions: . sodium chloride    . furosemide 160 mg (12/02/18 0913)  . nitroGLYCERIN 45 mcg/min (12/02/18 0300)     LOS: 1 day    Time spent: 30 minutes    Ryon Layton Darleen Crocker, DO [facility] Pager [phone]  If 7PM-7AM, please contact night-coverage [email or web address] Password [email or web address] 12/02/2018, 11:22 AM

## 2018-12-02 NOTE — Consult Note (Signed)
Referring Provider: No ref. provider found Primary Care Physician:  Redmond School, MD Primary Nephrologist:  Dr. Posey Pronto  Reason for Consultation: Chronic kidney disease stage V, incidence of euvolemia, management and evaluation of secondary hyperparathyroidism, management and evaluation of anemia  HPI: This 68 year old gentleman with a history of chronic kidney disease stage V he has been treated and evaluated by Dr. Posey Pronto at [facility].  He has been followed by Dr. Posey Pronto for many years.  He has an appointment coming up with Dr. Posey Pronto next 2 weeks.  He has a history of diabetes and diabetic nephropathy.  He also has secondary hyperparathyroidism and anemia.  He also has hyperlipidemia.  He presented with increasing dyspnea. Home medications Calcitrol 0.25 mcg daily, carvedilol 25 mg 2 times a day, Cardura 4 mg nightly, Procrit 10,000 units every month, Lasix 80 mg daily, hydralazine 25 mg twice daily, insulin degludec 20 units at bedtime, simvastatin 40 mg daily trazodone 100 mg daily verapamil 180 mg twice daily  Blood pressure 150/80 pulse 79 temperature 99.1 O2 sats 98%  IV Lasix 160 mg every 6 hours  Hospital medications Lipitor 20 mg nightly doxazosin 4 mg daily hydralazine 25 mg twice daily verapamil 260 mg nightly, trazodone 100 mg daily Calcitrol 0.25 mcg daily, insulin sliding scale, Lantus 20 mg nightly.  Urine output 1400 cc weight 102 kg  Sodium 140 potassium 4.4 chloride 104 CO2 22 BUN 63 creatinine 5.04 glucose 152 calcium 9.3 albumin 4.3 AST 15 ALT 15 hemoglobin 12 WBC 10 platelets 260  2D echo pending  Chest x-ray cardiomegaly with bilateral pulmonary edema small pulmonary effusions    Past Medical History:  Diagnosis Date  . Acute on chronic renal insufficiency   . Anemia    assoc w/ chronic reanl failure  . Arthralgia   . Chronic kidney disease    Stage 4  . Diabetes mellitus   . Dyslipidemia   . Erectile dysfunction due to arterial  insufficiency   . HLD (hyperlipidemia)   . Hypertension   . Insomnia   . Left renal mass   . Obesity   . Secondary hyperparathyroidism of renal origin ([place])   . Type 2 diabetes mellitus ([place])     Past Surgical History:  Procedure Laterality Date  . AV FISTULA PLACEMENT Left 11/02/2016   Procedure: ARTERIOVENOUS (AV) FISTULA CREATION LEFT UPPER ARM;  Surgeon: Elam Dutch, MD;  Location: [facility];  Service: Vascular;  Laterality: Left;  . IR GENERIC HISTORICAL  06/04/2014   IR RADIOLOGIST EVAL & MGMT 06/04/2014 Aletta Edouard, MD [facility] INTERV RAD  . IR GENERIC HISTORICAL  01/15/2016   IR RADIOLOGIST EVAL & MGMT 01/15/2016 [facility] INTERV RAD  . IR RADIOLOGIST EVAL & MGMT  01/25/2017  . RENAL BIOPSY    . RENAL BIOPSY, PERCUTANEOUS    . TONSILLECTOMY      Prior to Admission medications   Medication Sig Start Date End Date Taking? Authorizing Provider  calcitRIOL (ROCALTROL) 0.25 MCG capsule Take 0.25 mcg by mouth daily.   Yes [provider]  carvedilol (COREG) 25 MG tablet Take 25 mg by mouth 2 (two) times daily.   Yes [provider]  doxazosin (CARDURA) 4 MG tablet Take 4 mg by mouth at bedtime.   Yes [provider]  epoetin alfa (EPOGEN,PROCRIT) 56389 UNIT/ML injection 10,000 Units every 30 (thirty) days.   Yes Elmarie Shiley, MD  epoetin alfa (EPOGEN,PROCRIT) 37342 UNIT/ML injection 20,000 Units every 30 (thirty) days.   Yes [provider]  furosemide (LASIX) 40 MG tablet Take 80 mg by mouth daily.    Yes [provider]  hydrALAZINE (APRESOLINE) 25 MG tablet Take 25 mg by mouth 2 (two) times daily.   Yes [provider]  Insulin Degludec (TRESIBA FLEXTOUCH) 200 UNIT/ML SOPN Inject 20 Units into the skin at bedtime.   Yes [provider]  simvastatin (ZOCOR) 40 MG tablet Take 40 mg by mouth at bedtime.    Yes [provider]  traZODone (DESYREL) 100 MG tablet Take 100 mg by mouth at bedtime.   Yes [provider]  verapamil (CALAN-SR) 180 MG CR tablet Take 2 tablets (360 mg total) by mouth at bedtime. 12/04/13  Yes Short, Noah Delaine, MD  Vitamin D, Ergocalciferol, (DRISDOL) 50000 UNITS CAPS capsule Take 50,000 Units by mouth every Wednesday.    Yes [provider]    Current Facility-Administered Medications  Medication Dose Route Frequency Provider Last Rate Last Dose  . 0.9 %  sodium chloride infusion  250 mL Intravenous PRN Truett Mainland, DO      . acetaminophen (TYLENOL) tablet 650 mg  650 mg Oral Q4H PRN Truett Mainland, DO      . atorvastatin (LIPITOR) tablet 20 mg  20 mg Oral QHS Truett Mainland, DO   20 mg at 12/01/18 2105  . calcitRIOL (ROCALTROL) capsule 0.25 mcg  0.25 mcg Oral Daily Truett Mainland, DO   0.25 mcg at 12/02/18 8657  . doxazosin (CARDURA) tablet 4 mg  4 mg Oral QHS Truett Mainland, DO   4 mg at 12/01/18 2106  . furosemide (LASIX) 160 mg in dextrose 5 % 50 mL IVPB  160 mg Intravenous Q6H Truett Mainland, DO 66 mL/hr at 12/02/18 0913 160 mg at 12/02/18 0913  . heparin injection 5,000 Units  5,000 Units Subcutaneous Q8H Truett Mainland, DO   5,000 Units at 12/02/18 8469  . hydrALAZINE (APRESOLINE) tablet 25 mg  25 mg Oral BID Truett Mainland, DO   25 mg at 12/02/18 6295  . insulin aspart (novoLOG) injection 0-15 Units  0-15 Units Subcutaneous TID WC Truett Mainland, DO   2 Units at 12/02/18 2841  . insulin aspart (novoLOG) injection 0-5 Units  0-5 Units Subcutaneous QHS Stinson, Jacob J, DO      . insulin glargine (LANTUS) injection 20 Units  20 Units Subcutaneous QHS Truett Mainland, DO   20 Units at 12/01/18 2106  . nitroGLYCERIN 50 mg in dextrose 5 % 250 mL (0.2 mg/mL) infusion  5-200 mcg/min Intravenous Continuous Truett Mainland, DO 13.5 mL/hr at 12/02/18 0300 45 mcg/min at 12/02/18 0300  . ondansetron (ZOFRAN) injection 4 mg  4 mg Intravenous Q6H PRN Truett Mainland, DO   4 mg at 12/02/18 0650  . polyethylene glycol (MIRALAX / GLYCOLAX) packet 17 g  17 g  Oral Daily Manuella Ghazi, Pratik D, DO   17 g at 12/02/18 3244  . potassium chloride SA (K-DUR) CR tablet 40 mEq  40 mEq Oral TID Truett Mainland, DO   40 mEq at 12/02/18 0102  . sodium chloride flush (NS) 0.9 % injection 3 mL  3 mL Intravenous Q12H Truett Mainland, DO   3 mL at 12/02/18 0914  . sodium chloride flush (NS) 0.9 % injection 3 mL  3 mL Intravenous PRN Truett Mainland, DO      . traZODone (DESYREL) tablet 100 mg  100 mg Oral QHS Truett Mainland,  DO   100 mg at 12/01/18 2105  . verapamil (CALAN-SR) CR tablet 360 mg  360 mg Oral QHS Truett Mainland, DO   360 mg at 12/01/18 2105    Allergies as of 12/01/2018 - Review Complete 12/01/2018  Allergen Reaction Noted  . Contrast media [iodinated diagnostic agents] Other (See Comments) 04/10/2014  . Tetanus toxoids Swelling 01/04/2012    Family History  Problem Relation Age of Onset  . Diabetes Mellitus II Mother   . CAD Neg Hx   . Stroke Neg Hx     Social History   Socioeconomic History  . Marital status: Married    Spouse name: Not on file  . Number of children: Not on file  . Years of education: Not on file  . Highest education level: Not on file  Occupational History  . Not on file  Social Needs  . Financial resource strain: Not on file  . Food insecurity    Worry: Not on file    Inability: Not on file  . Transportation needs    Medical: Not on file    Non-medical: Not on file  Tobacco Use  . Smoking status: Former Research scientist (life sciences)  . Smokeless tobacco: Never Used  Substance and Sexual Activity  . Alcohol use: No    Comment: occasionally  . Drug use: No  . Sexual activity: Yes  Lifestyle  . Physical activity    Days per week: Not on file    Minutes per session: Not on file  . Stress: Not on file  Relationships  . Social Herbalist on phone: Not on file    Gets together: Not on file    Attends religious service: Not on file    Active member of club or organization: Not on file    Attends meetings of clubs or  organizations: Not on file    Relationship status: Not on file  . Intimate partner violence    Fear of current or ex partner: Not on file    Emotionally abused: Not on file    Physically abused: Not on file    Forced sexual activity: Not on file  Other Topics Concern  . Not on file  Social History Narrative  . Not on file    Review of Systems: Gen: Denies any fever, chills, sweats, anorexia, fatigue, positive for weakness and malaise no weight loss, and no sleep disorder HEENT: No visual complaints, No history of Retinopathy. Normal external appearance No Epistaxis or Sore throat. No sinusitis.   CV: Dyspnea on exertion increased lower extremity edema Resp: Denies dyspnea at rest, dyspnea with exercise, cough, sputum, wheezing, coughing up blood, and pleurisy. GI: Denies vomiting blood, jaundice, and fecal incontinence.   Denies dysphagia or odynophagia. GU : Denies urinary burning, blood in urine, urinary frequency, urinary hesitancy, nocturnal urination, and urinary incontinence.  No renal calculi. MS: Denies joint pain, limitation of movement, and swelling, stiffness, low back pain, extremity pain. Denies muscle weakness, cramps, atrophy.  No use of non steroidal antiinflammatory drugs. Derm: Denies rash, itching, dry skin, hives, moles, warts, or unhealing ulcers.  Psych: Denies depression, anxiety, memory loss, suicidal ideation, hallucinations, paranoia, and confusion. Heme: Denies bruising, bleeding, and enlarged lymph nodes. Neuro: No headache.  No diplopia. No dysarthria.  No dysphasia.  No history of CVA.  No Seizures. No paresthesias.  No weakness. Endocrine history of diabetes no history of thyroid disease  Physical Exam: Vital signs in last 24 hours: Temp:  [  98.2 F (36.8 C)-99.1 F (37.3 C)] 99.1 F (37.3 C) (07/04 0809) Pulse Rate:  [72-81] 79 (07/04 0809) Resp:  [15-31] 29 (07/04 0809) BP: (157-177)/(79-97) 163/84 (07/04 0610) SpO2:  [94 %-100 %] 98 % (07/04  0809) Weight:  [100 kg-103.1 kg] 102.2 kg (07/04 0450) Last BM Date: 11/29/18 General:   Alert,  Well-developed, well-nourished, pleasant and cooperative in NAD Head:  Normocephalic and atraumatic. Eyes:  Sclera clear, no icterus.   Conjunctiva pink. Ears:  Normal auditory acuity. Nose:  No deformity, discharge,  or lesions. Mouth:  No deformity or lesions, dentition normal. Neck:  Supple; no masses or thyromegaly.  JVP mildly elevated Lungs: Lungs reveal bilateral rales no wheezes. Heart:  Regular rate and rhythm; no murmurs, clicks, rubs,  or gallops. Abdomen:  Soft, nontender and nondistended. No masses, hepatosplenomegaly or hernias noted. Normal bowel sounds, without guarding, and without rebound.   Msk:  Symmetrical without gross deformities. Normal posture. Pulses:  No carotid, renal, femoral bruits. DP and PT symmetrical and equal Extremities: 1+ pitting edema  Intake/Output from previous day: 07/03 0701 - 07/04 0700 In: 280.2 [I.V.:163.4; IV Piggyback:116.8] Out: 1450 [Urine:1450] Intake/Output this shift: No intake/output data recorded.  Lab Results: Recent Labs    11/30/18 0741 11/30/18 0749 12/01/18 1330  WBC 9.1  --  10.0  HGB 11.2* 11.0* 12.0*  HCT 35.5*  --  38.4*  PLT 226  --  260   BMET Recent Labs    11/30/18 0741 12/01/18 1330 12/02/18 0426  NA 137 141 140  K 3.7 4.1 4.4  CL 99 101 104  CO2 22 24 22   GLUCOSE 93 196* 152*  BUN 55* 61* 63*  CREATININE 5.05* 5.29* 5.04*  CALCIUM 9.0  9.1 9.2 9.3  PHOS 4.6  --   --    LFT Recent Labs    12/01/18 1330  PROT 8.1  ALBUMIN 4.3  AST 15  ALT 15  ALKPHOS 42  BILITOT 0.8   PT/INR No results for input(s): LABPROT, INR in the last 72 hours. Hepatitis Panel No results for input(s): HEPBSAG, HCVAB, HEPAIGM, HEPBIGM in the last 72 hours.  Studies/Results: Dg Chest Portable 1 View  Result Date: 12/01/2018 CLINICAL DATA:  Shortness of breath. EXAM: PORTABLE CHEST 1 VIEW COMPARISON:  Radiograph of  November 26, 2013. FINDINGS: Mild cardiomegaly is noted. Interval development of interstitial densities are noted throughout both lungs concerning for pulmonary edema. Small pleural effusions may be present. No pneumothorax is noted. Bony thorax is unremarkable. IMPRESSION: Cardiomegaly is noted with bilateral pulmonary edema and small pleural effusions. Electronically Signed   By: Marijo Conception M.D.   On: 12/01/2018 13:50    Assessment/Plan:  Chronic kidney disease stage V patient being actively followed at [facility].  He has an AV fistula that is mature and ready to use in his left upper extremity.  Etiology of his renal disease appears to be diabetic nephrosclerosis.  Anemia does not appear to be an issue at this time  Secondary hyperparathyroidism we will check phosphorus and continue calcitriol 0.25 mcg daily  Hypertension/volume presented with congestive heart failure volume overload 2D echo pending.  Would recommend checking TSH.  Continue IV Lasix 160 mg every 6 hours we will also recommend addition of metolazone 10 mg daily  Diabetes mellitus as per primary team  Hyperlipidemia continue statins  Thank you very much for this very interesting consult we should continue diuresing this gentleman with 160 mg every 6 hours of Lasix  we shall add metolazone 10 mg to see if this will promote diuresis if we are unsuccessful in diuresing him he will need to start hemodialysis.  He understands this   LOS: [place] @TODAY @10 :37 AM

## 2018-12-03 LAB — BASIC METABOLIC PANEL
Anion gap: 17 — ABNORMAL HIGH (ref 5–15)
BUN: 75 mg/dL — ABNORMAL HIGH (ref 8–23)
CO2: 23 mmol/L (ref 22–32)
Calcium: 9.8 mg/dL (ref 8.9–10.3)
Chloride: 100 mmol/L (ref 98–111)
Creatinine, Ser: 5.6 mg/dL — ABNORMAL HIGH (ref 0.61–1.24)
GFR calc Af Amer: 11 mL/min — ABNORMAL LOW (ref >60)
GFR calc non Af Amer: 10 mL/min — ABNORMAL LOW (ref >60)
Glucose, Bld: 159 mg/dL — ABNORMAL HIGH (ref 70–99)
Potassium: 5 mmol/L (ref 3.5–5.1)
Sodium: 140 mmol/L (ref 135–145)

## 2018-12-03 LAB — GLUCOSE, CAPILLARY
Glucose-Capillary: 152 mg/dL — ABNORMAL HIGH (ref 70–99)
Glucose-Capillary: 246 mg/dL — ABNORMAL HIGH (ref 70–99)
Glucose-Capillary: 202 mg/dL — ABNORMAL HIGH (ref 70–99)
Glucose-Capillary: 98 mg/dL (ref 70–99)

## 2018-12-03 MED ORDER — BISACODYL 10 MG RE SUPP
10.0000 mg | Freq: Once | RECTAL | Status: AC
  Administered 2018-12-03: 10 mg via RECTAL
  Filled 2018-12-03: qty 1

## 2018-12-03 NOTE — Progress Notes (Signed)
[facility] ROUNDING NOTE   Subjective:   68 year old gentleman chronic kidney disease stage V treated by Dr. Parthenia [facility].  History of diabetes and diabetic nephropathy.  He also secondary parathyroidism hyperlipidemia anemia.  Presented with increased shortness of breath.  He was placed on high-dose IV Lasix 160 mg every 6 hours with good diuresis.  Sodium 140 potassium 5.0 chloride 100 CO2 23 BUN 75 creatinine 5.6 glucose 159 calcium 9.8 TSH 1.4 WBC 10 hemoglobin 12 platelets 260  Urine output 3 L 12/02/2018 weight decreased to 9 9 kg  Medications atorvastatin 20 mg nightly, calcitriol 0.25 mcg daily, doxazosin 4 mg nightly, Lasix 160 mg IV every 6 hours, hydralazine 25 mg twice daily, insulin glargine 20 units nightly, metolazone 10 mg daily, potassium chloride 20 mEq 3 times daily, trazodone 100 mg daily, verapamil 360 mg nightly.  Blood pressure 130/78 pulse 67 temperature 98.1   Objective:  Vital signs in last 24 hours:  Temp:  [98.1 F (36.7 C)-98.7 F (37.1 C)] 98.1 F (36.7 C) (07/05 0728) Pulse Rate:  [63-76] 68 (07/05 0728) Resp:  [13-36] 23 (07/05 0728) BP: (107-180)/(66-96) 107/86 (07/05 0630) SpO2:  [90 %-99 %] 95 % (07/05 0728) Weight:  [99.1 kg] 99.1 kg (07/05 0500)  Weight change: -0.9 kg Filed Weights   12/01/18 1721 12/02/18 0450 12/03/18 0500  Weight: 103.1 kg 102.2 kg 99.1 kg    Intake/Output: I/O last 3 completed shifts: In: 241.2 [I.V.:124.4; IV Piggyback:116.8] Out: 4100 [Urine:4100]   Intake/Output this shift:  No intake/output data recorded.  CVS- RRR no murmurs rubs or gallops AVP mildly increased RS-occasional bibasilar rales ABD- BS present soft non-distended EXT- no edema   Basic Metabolic Panel: Recent Labs  Lab 11/30/18 0741 12/01/18 1330 12/02/18 0426 12/03/18 0501  NA 137 141 140 140  K 3.7 4.1 4.4 5.0  CL 99 101 104 100  CO2 22 24 22 23   GLUCOSE 93 196* 152* 159*  BUN 55* 61*  63* 75*  CREATININE 5.05* 5.29* 5.04* 5.60*  CALCIUM 9.0  9.1 9.2 9.3 9.8  MG 2.1  --   --   --   PHOS 4.6  --   --   --     Liver Function Tests: Recent Labs  Lab 11/30/18 0741 12/01/18 1330  AST  --  15  ALT  --  15  ALKPHOS  --  42  BILITOT  --  0.8  PROT  --  8.1  ALBUMIN 3.9 4.3   No results for input(s): LIPASE, AMYLASE in the last 168 hours. No results for input(s): AMMONIA in the last 168 hours.  CBC: Recent Labs  Lab 11/30/18 0741 11/30/18 0749 12/01/18 1330  WBC 9.1  --  10.0  NEUTROABS 7.4  --  9.3*  HGB 11.2* 11.0* 12.0*  HCT 35.5*  --  38.4*  MCV 90.3  --  91.2  PLT 226  --  260    Cardiac Enzymes: No results for input(s): CKTOTAL, CKMB, CKMBINDEX, TROPONINI in the last 168 hours.  BNP: Invalid input(s): POCBNP  CBG: Recent Labs  Lab 12/02/18 0808 12/02/18 1127 12/02/18 1617 12/02/18 2049 12/03/18 0731  GLUCAP 129* 184* 137* 149* 152*    Microbiology: Results for orders placed or performed during the hospital encounter of 12/01/18  SARS Coronavirus 2 (CEPHEID - Performed in [facility] hospital lab), Hosp Order     Status: None   Collection Time: 12/01/18  1:27 PM   Specimen: Nasopharyngeal Swab  Result Value Ref Range Status   SARS Coronavirus 2 NEGATIVE NEGATIVE Final    Comment: (NOTE) If result is NEGATIVE SARS-CoV-2 target nucleic acids are NOT DETECTED. The SARS-CoV-2 RNA is generally detectable in upper and lower  respiratory specimens during the acute phase of infection. The lowest  concentration of SARS-CoV-2 viral copies this assay can detect is 250  copies / mL. A negative result does not preclude SARS-CoV-2 infection  and should not be used as the sole basis for treatment or other  patient management decisions.  A negative result may occur with  improper specimen collection / handling, submission of specimen other  than nasopharyngeal swab, presence of viral mutation(s) within the  areas targeted by this assay, and  inadequate number of viral copies  (<250 copies / mL). A negative result must be combined with clinical  observations, patient history, and epidemiological information. If result is POSITIVE SARS-CoV-2 target nucleic acids are DETECTED. The SARS-CoV-2 RNA is generally detectable in upper and lower  respiratory specimens dur ing the acute phase of infection.  Positive  results are indicative of active infection with SARS-CoV-2.  Clinical  correlation with patient history and other diagnostic information is  necessary to determine patient infection status.  Positive results do  not rule out bacterial infection or co-infection with other viruses. If result is PRESUMPTIVE POSTIVE SARS-CoV-2 nucleic acids MAY BE PRESENT.   A presumptive positive result was obtained on the submitted specimen  and confirmed on repeat testing.  While 2019 novel coronavirus  (SARS-CoV-2) nucleic acids may be present in the submitted sample  additional confirmatory testing may be necessary for epidemiological  and / or clinical management purposes  to differentiate between  SARS-CoV-2 and other Sarbecovirus currently known to infect humans.  If clinically indicated additional testing with an alternate test  methodology [phone]) is advised. The SARS-CoV-2 RNA is generally  detectable in upper and lower respiratory sp ecimens during the acute  phase of infection. The expected result is Negative. Fact Sheet for Patients:  StrictlyIdeas.no Fact Sheet for Healthcare Providers: BankingDealers.co.za This test is not yet approved or cleared by the [place] FDA and has been authorized for detection and/or diagnosis of SARS-CoV-2 by FDA under an Emergency Use Authorization (EUA).  This EUA will remain in effect (meaning this test can be used) for the duration of the COVID-19 declaration under Section 564(b)(1) of the Act, 21 U.S.C. section 360bbb-3(b)(1), unless the  authorization is terminated or revoked sooner. Performed at [facility], [address] [place], [place] [address]   MRSA PCR Screening     Status: None   Collection Time: 12/01/18  5:22 PM   Specimen: Nasal Mucosa; Nasopharyngeal  Result Value Ref Range Status   MRSA by PCR NEGATIVE NEGATIVE Final    Comment:        The GeneXpert MRSA Assay (FDA approved for NASAL specimens only), is one component of a comprehensive MRSA colonization surveillance program. It is not intended to diagnose MRSA infection nor to guide or monitor treatment for MRSA infections. Performed at [facility], [address] [place],  [address]     Coagulation Studies: No results for input(s): LABPROT, INR in the last 72 hours.  Urinalysis: No results for input(s): COLORURINE, LABSPEC, PHURINE, GLUCOSEU, HGBUR, BILIRUBINUR, KETONESUR, PROTEINUR, UROBILINOGEN, NITRITE, LEUKOCYTESUR in the last 72 hours.  Invalid input(s): APPERANCEUR    Imaging: Dg Chest Portable 1 View  Result Date: 12/01/2018 CLINICAL DATA:  Shortness of breath. EXAM: PORTABLE CHEST 1  VIEW COMPARISON:  Radiograph of November 26, 2013. FINDINGS: Mild cardiomegaly is noted. Interval development of interstitial densities are noted throughout both lungs concerning for pulmonary edema. Small pleural effusions may be present. No pneumothorax is noted. Bony thorax is unremarkable. IMPRESSION: Cardiomegaly is noted with bilateral pulmonary edema and small pleural effusions. Electronically Signed   By: Marijo Conception M.D.   On: 12/01/2018 13:50     Medications:   . sodium chloride    . furosemide 160 mg (12/03/18 0800)  . nitroGLYCERIN Stopped (12/02/18 1300)   . atorvastatin  20 mg Oral QHS  . calcitRIOL  0.25 mcg Oral Daily  . doxazosin  4 mg Oral QHS  . heparin  5,000 Units Subcutaneous Q8H  . hydrALAZINE  25 mg Oral BID  . insulin aspart  0-15 Units Subcutaneous TID WC  . insulin aspart  0-5 Units Subcutaneous QHS  . insulin  glargine  20 Units Subcutaneous QHS  . metolazone  10 mg Oral Daily  . polyethylene glycol  17 g Oral Daily  . potassium chloride  40 mEq Oral TID  . sodium chloride flush  3 mL Intravenous Q12H  . traZODone  100 mg Oral QHS  . verapamil  360 mg Oral QHS   sodium chloride, acetaminophen, hydrALAZINE, ondansetron (ZOFRAN) IV, sodium chloride flush  Assessment/ Plan:   Chronic kidney disease stage V patient being actively followed at [facility].  He has an AV fistula that is mature and ready to use in his left upper extremity.  Etiology of his renal disease appears to be diabetic nephrosclerosis.  There are no indications for urgent dialysis at this time  Anemia does not appear to be an issue at this time  Secondary hyperparathyroidism we will check phosphorus and continue calcitriol 0.25 mcg daily  Hypertension/volume presented with congestive heart failure volume overload, 2D echo EF 25 to 30% .    TSH within goal.  Continues to diurese well with 160 mg IV Lasix every 6 hours metolazone 10 mg daily we will continue diuresis  Diabetes mellitus as per primary team  Hyperlipidemia continue statins  Congestive heart failure with systolic dysfunction EF now 25 to 30%  No indications for dialysis at this point we will continue to diurese patient.   LOS: [place] @TODAY @9 :29 AM

## 2018-12-03 NOTE — Progress Notes (Signed)
PROGRESS NOTE    Steven Perez  IRJ:[id number] DOB: [birth year] DOA: 12/01/2018 PCP: Redmond School, MD   Brief Narrative:  Per HPI: [name] a 68 y.o.malewith a history of end-stage renal disease secondary to acute interstitial nephritis and complex cystic mass on left kidney, hypertension, diabetes on insulin. Patient seen due to shortness of breath this morning. His shortness of breath has become increasingly worse over as the day progressed. He called EMS who found him strongly hypoxic with oxygen sats in the mid 80s. He was placed on nonrebreather brought to the hospital. His shortness of breath was worse with exertion and improved with rest. He does have end-stage renal disease and had a fistula placed approximately 2 years ago. He has not started dialysis yet, but is on the transplant list at [facility].  Patient was admitted with acute hypercapnic and hypoxemic respiratory failure secondary to acute on chronic systolic congestive heart failure in the setting of end-stage renal disease.  He has been started on aggressive IV Lasix dosing with some diuresis noted thus far.  He is steadily improving on 7/5 and will be transferred to telemetry today.  He has been weaned off nitroglycerin drip and has good urine output noted.  Assessment & Plan:   Active Problems:   Diabetes mellitus (HCC)   HTN (hypertension)   CKD (chronic kidney disease) stage 5, GFR less than 15 ml/min (HCC)   Acute respiratory failure with hypoxia and hypercapnia (HCC)   Acute systolic CHF (congestive heart failure) (HCC)   Acute combined respiratory failure secondary to acute on chronic systolic congestive heart failure exacerbation with volume overload -Wean nasal cannula oxygen as tolerated -Nitroglycerin has been weaned off -Continue Lasix 160 mg every 6 hours with metolazone added by nephrology -Transfer to telemetry today -2D echocardiogram with LVEF 25-30% noted and moderate LVH.  Diffuse  hypokinesis.  Will ask for cardiology to evaluate in a.m. -TSH 1.429 -Strict I's and O's and daily weights ongoing -Repeat BMP in a.m.  ESRD -Nephrology following and will initiate dialysis as needed -Follow labs and continue aggressive IV diuresis for now  Hypertension -Still elevated, but improved control noted -Patient off nitroglycerin  Diabetes -Blood glucose readings currently stable -Continue SSI and long-acting insulin -Carb modified diet  Constipation -We will try bisacodyl suppository today   DVT prophylaxis: Heparin Code Status: Full Family Communication: None at bedside Disposition Plan: [facility] nephrology recommendations with addition of metolazone.  Continue aggressive Lasix diuresis and consider hemodialysis as needed.    Transfer to telemetry.  Patient requires inpatient stay due to IV Lasix use and blood pressure control.   Consultants:   Nephrology  Procedures:   None  Antimicrobials:   None  Subjective: Patient seen and evaluated today with some nausea but no vomiting overnight.  He has not had a bowel movement in the last 4 to 5 days.  He continues to feel very weak and has lack of appetite.  He has had good urine output of a little over 2.5 L last night.  He is off his nitroglycerin drip.  Objective: Vitals:   12/03/18 0500 12/03/18 0600 12/03/18 0630 12/03/18 0728  BP: (!) 157/96 (!) 144/81 107/86   Pulse: 69 67 68 68  Resp: (!) 22 (!) 27 (!) 21 (!) 23  Temp: 98.2 F (36.8 C)   98.1 F (36.7 C)  TempSrc:    Oral  SpO2: 95% 93% 94% 95%  Weight: 99.1 kg     Height:  Intake/Output Summary (Last 24 hours) at 12/03/2018 0752 Last data filed at 12/03/2018 0300 Gross per 24 hour  Intake -  Output 2650 ml  Net -2650 ml   Filed Weights   12/01/18 1721 12/02/18 0450 12/03/18 0500  Weight: 103.1 kg 102.2 kg 99.1 kg    Examination:  General exam: Appears calm and comfortable, minimally distressed. Respiratory system:  Clear to auscultation. Respiratory effort normal.  On nasal cannula. Cardiovascular system: S1 & S2 heard, RRR. No JVD, murmurs, rubs, gallops or clicks. No pedal edema. Gastrointestinal system: Abdomen is nondistended, soft and nontender. No organomegaly or masses felt. Normal bowel sounds heard. Central nervous system: Alert and oriented. No focal neurological deficits. Extremities: Symmetric 5 x 5 power. Skin: No rashes, lesions or ulcers Psychiatry: Judgement and insight appear normal. Mood & affect appropriate.     Data Reviewed: I have personally reviewed following labs and imaging studies  CBC: Recent Labs  Lab 11/30/18 0741 11/30/18 0749 12/01/18 1330  WBC 9.1  --  10.0  NEUTROABS 7.4  --  9.3*  HGB 11.2* 11.0* 12.0*  HCT 35.5*  --  38.4*  MCV 90.3  --  91.2  PLT 226  --  191   Basic Metabolic Panel: Recent Labs  Lab 11/30/18 0741 12/01/18 1330 12/02/18 0426 12/03/18 0501  NA 137 141 140 140  K 3.7 4.1 4.4 5.0  CL 99 101 104 100  CO2 22 24 22 23   GLUCOSE 93 196* 152* 159*  BUN 55* 61* 63* 75*  CREATININE 5.05* 5.29* 5.04* 5.60*  CALCIUM 9.0  9.1 9.2 9.3 9.8  MG 2.1  --   --   --   PHOS 4.6  --   --   --    GFR: Estimated Creatinine Clearance: 14.4 mL/min (A) (by C-G formula based on SCr of 5.6 mg/dL (H)). Liver Function Tests: Recent Labs  Lab 11/30/18 0741 12/01/18 1330  AST  --  15  ALT  --  15  ALKPHOS  --  42  BILITOT  --  0.8  PROT  --  8.1  ALBUMIN 3.9 4.3   No results for input(s): LIPASE, AMYLASE in the last 168 hours. No results for input(s): AMMONIA in the last 168 hours. Coagulation Profile: No results for input(s): INR, PROTIME in the last 168 hours. Cardiac Enzymes: No results for input(s): CKTOTAL, CKMB, CKMBINDEX, TROPONINI in the last 168 hours. BNP (last 3 results) No results for input(s): PROBNP in the last 8760 hours. HbA1C: No results for input(s): HGBA1C in the last 72 hours. CBG: Recent Labs  Lab 12/02/18 0808  12/02/18 1127 12/02/18 1617 12/02/18 2049 12/03/18 0731  GLUCAP 129* 184* 137* 149* 152*   Lipid Profile: No results for input(s): CHOL, HDL, LDLCALC, TRIG, CHOLHDL, LDLDIRECT in the last 72 hours. Thyroid Function Tests: Recent Labs    12/02/18 1208  TSH 1.429   Anemia Panel: No results for input(s): VITAMINB12, FOLATE, FERRITIN, TIBC, IRON, RETICCTPCT in the last 72 hours. Sepsis Labs: No results for input(s): PROCALCITON, LATICACIDVEN in the last 168 hours.  Recent Results (from the past 240 hour(s))  SARS Coronavirus 2 (CEPHEID - Performed in [place] hospital lab), Hosp Order     Status: None   Collection Time: 12/01/18  1:27 PM   Specimen: Nasopharyngeal Swab  Result Value Ref Range Status   SARS Coronavirus 2 NEGATIVE NEGATIVE Final    Comment: (NOTE) If result is NEGATIVE SARS-CoV-2 target nucleic acids are NOT DETECTED. The SARS-CoV-2 RNA  is generally detectable in upper and lower  respiratory specimens during the acute phase of infection. The lowest  concentration of SARS-CoV-2 viral copies this assay can detect is 250  copies / mL. A negative result does not preclude SARS-CoV-2 infection  and should not be used as the sole basis for treatment or other  patient management decisions.  A negative result may occur with  improper specimen collection / handling, submission of specimen other  than nasopharyngeal swab, presence of viral mutation(s) within the  areas targeted by this assay, and inadequate number of viral copies  (<250 copies / mL). A negative result must be combined with clinical  observations, patient history, and epidemiological information. If result is POSITIVE SARS-CoV-2 target nucleic acids are DETECTED. The SARS-CoV-2 RNA is generally detectable in upper and lower  respiratory specimens dur ing the acute phase of infection.  Positive  results are indicative of active infection with SARS-CoV-2.  Clinical  correlation with patient history and  other diagnostic information is  necessary to determine patient infection status.  Positive results do  not rule out bacterial infection or co-infection with other viruses. If result is PRESUMPTIVE POSTIVE SARS-CoV-2 nucleic acids MAY BE PRESENT.   A presumptive positive result was obtained on the submitted specimen  and confirmed on repeat testing.  While 2019 novel coronavirus  (SARS-CoV-2) nucleic acids may be present in the submitted sample  additional confirmatory testing may be necessary for epidemiological  and / or clinical management purposes  to differentiate between  SARS-CoV-2 and other Sarbecovirus currently known to infect humans.  If clinically indicated additional testing with an alternate test  methodology [phone]) is advised. The SARS-CoV-2 RNA is generally  detectable in upper and lower respiratory sp ecimens during the acute  phase of infection. The expected result is Negative. Fact Sheet for Patients:  StrictlyIdeas.no Fact Sheet for Healthcare Providers: BankingDealers.co.za This test is not yet approved or cleared by the [place] FDA and has been authorized for detection and/or diagnosis of SARS-CoV-2 by FDA under an Emergency Use Authorization (EUA).  This EUA will remain in effect (meaning this test can be used) for the duration of the COVID-19 declaration under Section 564(b)(1) of the Act, 21 U.S.C. section 360bbb-3(b)(1), unless the authorization is terminated or revoked sooner. Performed at [facility], [address] [place], [place] [address]   MRSA PCR Screening     Status: None   Collection Time: 12/01/18  5:22 PM   Specimen: Nasal Mucosa; Nasopharyngeal  Result Value Ref Range Status   MRSA by PCR NEGATIVE NEGATIVE Final    Comment:        The GeneXpert MRSA Assay (FDA approved for NASAL specimens only), is one component of a comprehensive MRSA colonization surveillance program. It is not  intended to diagnose MRSA infection nor to guide or monitor treatment for MRSA infections. Performed at [facility], [address] [place], [place] [address]          Radiology Studies: Dg Chest Portable 1 View  Result Date: 12/01/2018 CLINICAL DATA:  Shortness of breath. EXAM: PORTABLE CHEST 1 VIEW COMPARISON:  Radiograph of November 26, 2013. FINDINGS: Mild cardiomegaly is noted. Interval development of interstitial densities are noted throughout both lungs concerning for pulmonary edema. Small pleural effusions may be present. No pneumothorax is noted. Bony thorax is unremarkable. IMPRESSION: Cardiomegaly is noted with bilateral pulmonary edema and small pleural effusions. Electronically Signed   By: Marijo Conception M.D.   On: 12/01/2018 13:50  Scheduled Meds: . atorvastatin  20 mg Oral QHS  . bisacodyl  10 mg Rectal Once  . calcitRIOL  0.25 mcg Oral Daily  . doxazosin  4 mg Oral QHS  . heparin  5,000 Units Subcutaneous Q8H  . hydrALAZINE  25 mg Oral BID  . insulin aspart  0-15 Units Subcutaneous TID WC  . insulin aspart  0-5 Units Subcutaneous QHS  . insulin glargine  20 Units Subcutaneous QHS  . metolazone  10 mg Oral Daily  . polyethylene glycol  17 g Oral Daily  . potassium chloride  40 mEq Oral TID  . sodium chloride flush  3 mL Intravenous Q12H  . traZODone  100 mg Oral QHS  . verapamil  360 mg Oral QHS   Continuous Infusions: . sodium chloride    . furosemide 160 mg (12/03/18 0159)  . nitroGLYCERIN Stopped (12/02/18 1300)     LOS: 2 days    Time spent: 30 minutes    Shirell Struthers Darleen Crocker, DO [facility] Pager [phone]  If 7PM-7AM, please contact night-coverage [email or web address] Password [email or web address] 12/03/2018, 7:52 AM

## 2018-12-04 DIAGNOSIS — I5021 Acute systolic (congestive) heart failure: Secondary | ICD-10-CM

## 2018-12-04 LAB — BASIC METABOLIC PANEL
Anion gap: 15 (ref 5–15)
BUN: 92 mg/dL — ABNORMAL HIGH (ref 8–23)
CO2: 22 mmol/L (ref 22–32)
Calcium: 9.7 mg/dL (ref 8.9–10.3)
Chloride: 99 mmol/L (ref 98–111)
Creatinine, Ser: 5.77 mg/dL — ABNORMAL HIGH (ref 0.61–1.24)
GFR calc Af Amer: 11 mL/min — ABNORMAL LOW (ref >60)
GFR calc non Af Amer: 9 mL/min — ABNORMAL LOW (ref >60)
Glucose, Bld: 106 mg/dL — ABNORMAL HIGH (ref 70–99)
Potassium: 4.7 mmol/L (ref 3.5–5.1)
Sodium: 136 mmol/L (ref 135–145)

## 2018-12-04 LAB — GLUCOSE, CAPILLARY
Glucose-Capillary: 107 mg/dL — ABNORMAL HIGH (ref 70–99)
Glucose-Capillary: 113 mg/dL — ABNORMAL HIGH (ref 70–99)
Glucose-Capillary: 111 mg/dL — ABNORMAL HIGH (ref 70–99)
Glucose-Capillary: 138 mg/dL — ABNORMAL HIGH (ref 70–99)

## 2018-12-04 MED ORDER — TEMAZEPAM 15 MG PO CAPS
15.0000 mg | ORAL_CAPSULE | Freq: Every day | ORAL | Status: DC
  Administered 2018-12-04 – 2018-12-06 (×3): 15 mg via ORAL
  Filled 2018-12-04 (×3): qty 1

## 2018-12-04 MED ORDER — ISOSORBIDE MONONITRATE ER 30 MG PO TB24
15.0000 mg | ORAL_TABLET | Freq: Every day | ORAL | Status: DC
  Administered 2018-12-04 – 2018-12-07 (×4): 15 mg via ORAL
  Filled 2018-12-04 (×4): qty 1

## 2018-12-04 MED ORDER — CARVEDILOL 3.125 MG PO TABS
6.2500 mg | ORAL_TABLET | Freq: Two times a day (BID) | ORAL | Status: DC
  Administered 2018-12-04 (×2): 6.25 mg via ORAL
  Filled 2018-12-04 (×3): qty 2

## 2018-12-04 MED ORDER — SORBITOL 70 % SOLN
960.0000 mL | TOPICAL_OIL | Freq: Once | ORAL | Status: AC
  Administered 2018-12-04: 960 mL via RECTAL
  Filled 2018-12-04: qty 473

## 2018-12-04 NOTE — Plan of Care (Signed)
  Problem: Acute Rehab PT Goals(only PT should resolve) Goal: Pt Will Go Supine/Side To Sit Outcome: Progressing Flowsheets (Taken 12/04/2018 1405) Pt will go Supine/Side to Sit: with modified independence Goal: Patient Will Transfer Sit To/From Stand Outcome: Progressing Flowsheets (Taken 12/04/2018 1405) Patient will transfer sit to/from stand: with modified independence Goal: Pt Will Transfer Bed To Chair/Chair To Bed Outcome: Progressing Flowsheets (Taken 12/04/2018 1405) Pt will Transfer Bed to Chair/Chair to Bed: with modified independence Goal: Pt Will Ambulate Outcome: Progressing Flowsheets (Taken 12/04/2018 1405) Pt will Ambulate:  100 feet  with modified independence   2:06 PM, 12/04/18 Lonell Grandchild, MPT Physical Therapist with [facility] [phone] office [phone] mobile phone

## 2018-12-04 NOTE — Progress Notes (Signed)
PROGRESS NOTE    Steven Perez  INO:[id number] DOB: Jan 03, 1951 DOA: 12/01/2018 PCP: Steven School, MD   Brief Narrative:  Per HPI: Steven Perez a 68 y.o.malewith a history of end-stage renal disease secondary to acute interstitial nephritis and complex cystic mass on left kidney, hypertension, diabetes on insulin. Patient seen due to shortness of breath this morning. His shortness of breath has become increasingly worse over as the day progressed. He called EMS who found him strongly hypoxic with oxygen sats in the mid 80s. He was placed on nonrebreather brought to the hospital. His shortness of breath was worse with exertion and improved with rest. He does have end-stage renal disease and had a fistula placed approximately 2 years ago. He has not started dialysis yet, but is on the transplant list at [facility].  Patient was admitted with acute hypercapnic and hypoxemic respiratory failure secondary to acute on chronic systolic congestive heart failure in the setting of end-stage renal disease. He has been started on aggressive IV Lasix dosing with some diuresis noted thus far.  He is steadily improving on 7/5 and will be transferred to telemetry today.  He has been weaned off nitroglycerin drip and has good urine output noted.  On 7/6, further diuresis has been held by nephrology at this time.  They are still evaluating need for hemodialysis while inpatient.  Cardiology has evaluated the patient and recommends initiation of Imdur as well as Coreg.  He will have PT evaluation performed today.   Assessment & Plan:   Active Problems:   Diabetes mellitus (HCC)   HTN (hypertension)   CKD (chronic kidney disease) stage 5, GFR less than 15 ml/min (HCC)   Acute respiratory failure with hypoxia and hypercapnia (HCC)   Acute systolic CHF (congestive heart failure) (HCC)   Acute combined respiratory failure secondary to acute on chronic systolic congestive heart failure exacerbation  with volume overload -Diuresis held by nephrology today; will reassess tomorrow -2D echocardiogram with LVEF 25-30% noted and moderate LVH.  Diffuse hypokinesis.    Appreciate cardiology recommendations as noted below. -TSH 1.429 -Strict I's and O's and daily weights ongoing, -3.3 L fluid output noted overnight -Repeat BMP in a.m.  ESRD -Nephrology following and will initiate dialysis as needed -Follow labs with IV diuresis held for now.  Hypertension -Still elevated, but improved control noted -Patient off nitroglycerin -Imdur and Coreg added by cardiology  Diabetes -Blood glucose readings currently stable -Continue SSI and long-acting insulin -Carb modified diet  Constipation -No result from suppository, will give enema today   DVT prophylaxis:Heparin Code Status:Full Family Communication:None at bedside Disposition Plan:Appreciate nephrology and cardiology recommendations with further IV Lasix being held at this time and close monitoring of symptomatology.  PT evaluation today.  Patient requires inpatient stay due to IV Lasix use and blood pressure control.   Consultants:  Nephrology  Cardiology  Procedures:  None  Antimicrobials:   None  Subjective: Patient seen and evaluated today with no new acute complaints or concerns. No acute concerns or events noted overnight.  He continues to feel weak, but less nauseous.  Objective: Vitals:   12/03/18 1000 12/03/18 1138 12/03/18 2200 12/04/18 0602  BP: 133/73  140/74 (!) 164/79  Pulse: 67 67 66 65  Resp: 18 (!) 32 20 17  Temp:  98.4 F (36.9 C) 98.1 F (36.7 C) 97.7 F (36.5 C)  TempSrc:  Oral Oral Oral  SpO2: 93% 94% 96% 98%  Weight:    97.5 kg  Height:  Intake/Output Summary (Last 24 hours) at 12/04/2018 1056 Last data filed at 12/04/2018 0602 Gross per 24 hour  Intake 240 ml  Output 3550 ml  Net -3310 ml   Filed Weights   12/02/18 0450 12/03/18 0500 12/04/18 0602  Weight:  102.2 kg 99.1 kg 97.5 kg    Examination:  General exam: Appears calm and comfortable  Respiratory system: Clear to auscultation. Respiratory effort normal. Cardiovascular system: S1 & S2 heard, RRR. No JVD, murmurs, rubs, gallops or clicks. No pedal edema. Gastrointestinal system: Abdomen is nondistended, soft and nontender. No organomegaly or masses felt. Normal bowel sounds heard. Central nervous system: Alert and oriented. No focal neurological deficits. Extremities: Symmetric 5 x 5 power. Skin: No rashes, lesions or ulcers Psychiatry: Judgement and insight appear normal. Mood & affect appropriate.     Data Reviewed: I have personally reviewed following labs and imaging studies  CBC: Recent Labs  Lab 11/30/18 0741 11/30/18 0749 12/01/18 1330  WBC 9.1  --  10.0  NEUTROABS 7.4  --  9.3*  HGB 11.2* 11.0* 12.0*  HCT 35.5*  --  38.4*  MCV 90.3  --  91.2  PLT 226  --  790   Basic Metabolic Panel: Recent Labs  Lab 11/30/18 0741 12/01/18 1330 12/02/18 0426 12/03/18 0501 12/04/18 0549  NA 137 141 140 140 136  K 3.7 4.1 4.4 5.0 4.7  CL 99 101 104 100 99  CO2 22 24 22 23 22   GLUCOSE 93 196* 152* 159* 106*  BUN 55* 61* 63* 75* 92*  CREATININE 5.05* 5.29* 5.04* 5.60* 5.77*  CALCIUM 9.0  9.1 9.2 9.3 9.8 9.7  MG 2.1  --   --   --   --   PHOS 4.6  --   --   --   --    GFR: Estimated Creatinine Clearance: 13.9 mL/min (A) (by C-G formula based on SCr of 5.77 mg/dL (H)). Liver Function Tests: Recent Labs  Lab 11/30/18 0741 12/01/18 1330  AST  --  15  ALT  --  15  ALKPHOS  --  42  BILITOT  --  0.8  PROT  --  8.1  ALBUMIN 3.9 4.3   No results for input(s): LIPASE, AMYLASE in the last 168 hours. No results for input(s): AMMONIA in the last 168 hours. Coagulation Profile: No results for input(s): INR, PROTIME in the last 168 hours. Cardiac Enzymes: No results for input(s): CKTOTAL, CKMB, CKMBINDEX, TROPONINI in the last 168 hours. BNP (last 3 results) No results  for input(s): PROBNP in the last 8760 hours. HbA1C: No results for input(s): HGBA1C in the last 72 hours. CBG: Recent Labs  Lab 12/03/18 0731 12/03/18 1124 12/03/18 1620 12/03/18 2154 12/04/18 0745  GLUCAP 152* 246* 202* 98 107*   Lipid Profile: No results for input(s): CHOL, HDL, LDLCALC, TRIG, CHOLHDL, LDLDIRECT in the last 72 hours. Thyroid Function Tests: Recent Labs    12/02/18 1208  TSH 1.429   Anemia Panel: No results for input(s): VITAMINB12, FOLATE, FERRITIN, TIBC, IRON, RETICCTPCT in the last 72 hours. Sepsis Labs: No results for input(s): PROCALCITON, LATICACIDVEN in the last 168 hours.  Recent Results (from the past 240 hour(s))  SARS Coronavirus 2 (CEPHEID - Performed in [place] hospital lab), Hosp Order     Status: None   Collection Time: 12/01/18  1:27 PM   Specimen: Nasopharyngeal Swab  Result Value Ref Range Status   SARS Coronavirus 2 NEGATIVE NEGATIVE Final    Comment: (NOTE) If result  is NEGATIVE SARS-CoV-2 target nucleic acids are NOT DETECTED. The SARS-CoV-2 RNA is generally detectable in upper and lower  respiratory specimens during the acute phase of infection. The lowest  concentration of SARS-CoV-2 viral copies this assay can detect is 250  copies / mL. A negative result does not preclude SARS-CoV-2 infection  and should not be used as the sole basis for treatment or other  patient management decisions.  A negative result may occur with  improper specimen collection / handling, submission of specimen other  than nasopharyngeal swab, presence of viral mutation(s) within the  areas targeted by this assay, and inadequate number of viral copies  (<250 copies / mL). A negative result must be combined with clinical  observations, patient history, and epidemiological information. If result is POSITIVE SARS-CoV-2 target nucleic acids are DETECTED. The SARS-CoV-2 RNA is generally detectable in upper and lower  respiratory specimens dur ing the  acute phase of infection.  Positive  results are indicative of active infection with SARS-CoV-2.  Clinical  correlation with patient history and other diagnostic information is  necessary to determine patient infection status.  Positive results do  not rule out bacterial infection or co-infection with other viruses. If result is PRESUMPTIVE POSTIVE SARS-CoV-2 nucleic acids MAY BE PRESENT.   A presumptive positive result was obtained on the submitted specimen  and confirmed on repeat testing.  While 2019 novel coronavirus  (SARS-CoV-2) nucleic acids may be present in the submitted sample  additional confirmatory testing may be necessary for epidemiological  and / or clinical management purposes  to differentiate between  SARS-CoV-2 and other Sarbecovirus currently known to infect humans.  If clinically indicated additional testing with an alternate test  methodology [id number]) is advised. The SARS-CoV-2 RNA is generally  detectable in upper and lower respiratory sp ecimens during the acute  phase of infection. The expected result is Negative. Fact Sheet for Patients:  StrictlyIdeas.no Fact Sheet for Healthcare Providers: BankingDealers.co.za This test is not yet approved or cleared by the [place] FDA and has been authorized for detection and/or diagnosis of SARS-CoV-2 by FDA under an Emergency Use Authorization (EUA).  This EUA will remain in effect (meaning this test can be used) for the duration of the COVID-19 declaration under Section 564(b)(1) of the Act, 21 U.S.C. section 360bbb-3(b)(1), unless the authorization is terminated or revoked sooner. Performed at [facility], [address], [place], [place] [address]   MRSA PCR Screening     Status: None   Collection Time: 12/01/18  5:22 PM   Specimen: Nasal Mucosa; Nasopharyngeal  Result Value Ref Range Status   MRSA by PCR NEGATIVE NEGATIVE Final    Comment:        The  GeneXpert MRSA Assay (FDA approved for NASAL specimens only), is one component of a comprehensive MRSA colonization surveillance program. It is not intended to diagnose MRSA infection nor to guide or monitor treatment for MRSA infections. Performed at [facility], [address], [place], [place] [address]          Radiology Studies: No results found.      Scheduled Meds: . atorvastatin  20 mg Oral QHS  . calcitRIOL  0.25 mcg Oral Daily  . carvedilol  6.25 mg Oral BID WC  . doxazosin  4 mg Oral QHS  . heparin  5,000 Units Subcutaneous Q8H  . hydrALAZINE  25 mg Oral BID  . insulin aspart  0-15 Units Subcutaneous TID WC  . insulin aspart  0-5 Units  Subcutaneous QHS  . insulin glargine  20 Units Subcutaneous QHS  . isosorbide mononitrate  15 mg Oral Daily  . polyethylene glycol  17 g Oral Daily  . sodium chloride flush  3 mL Intravenous Q12H  . sorbitol, milk of mag, mineral oil, glycerin (SMOG) enema  960 mL Rectal Once  . traZODone  100 mg Oral QHS   Continuous Infusions: . sodium chloride       LOS: 3 days    Time spent: 30 minutes    Dotty Gonzalo Darleen Crocker, DO [facility] Pager [phone]  If 7PM-7AM, please contact night-coverage [email or web address] Password [email or web address] 12/04/2018, 10:56 AM

## 2018-12-04 NOTE — TOC Initial Note (Signed)
Transition of Care [facility]) - Initial/Assessment Note    Patient Details  Name: Steven Perez MRN: [id number] Date of Birth: 1950/11/07  [facility] CM/SW Contact:    Shade Flood, LCSW Phone Number: 12/04/2018, 4:01 PM  Clinical Narrative:                  Pt admitted from home. Consult received for [facility] screen. PT recommending [facility]. Spoke with pt who states that he lives with his wife and that he has been independent in ADLs at home. He does not use any DME. Per pt, he does not have any difficulty getting to appointments or obtaining his medications.   Discussed HH PT recommendation and pt is agreeable. Referral sent to [facility] at pt request.   Per MD, pt may need HD at some point soon though he wasn't anticipating it would be during this admission. Nephrology is following.   TOC will follow and assist as needed.  Expected Discharge Plan: [place] Barriers to Discharge: Continued Medical Work up   Patient Goals and CMS Choice Patient states their goals for this hospitalization and ongoing recovery are:: Return home to prior level of function CMS Medicare.gov Compare Post Acute Care list provided to:: Patient Choice offered to / list presented to : Patient  Expected Discharge Plan and Services Expected Discharge Plan: [place] In-house Referral: Clinical Social Work Discharge Planning Services: CM Consult Post Acute Care Choice: [place] arrangements for the past 2 months: [place]: PT [place]: [place] ([place]) Date [place]: 12/04/18 Time [place]: 1559 Representative spoke with at [place]: [place] Arrangements/Services Living arrangements for the past 2 months: [place] with:: Spouse Patient language and need for interpreter reviewed:: Yes Do you feel safe going back to the place where  you live?: Yes      Need for Family Participation in Patient Care: No (Comment) Care giver support system in place?: Yes (comment)   Criminal Activity/Legal Involvement Pertinent to Current Situation/Hospitalization: No - Comment as needed  Activities of Daily Living Home Assistive Devices/Equipment: None ADL Screening (condition at time of admission) Patient's cognitive ability adequate to safely complete daily activities?: Yes Is the patient deaf or have difficulty hearing?: No Does the patient have difficulty seeing, even when wearing glasses/contacts?: No Does the patient have difficulty concentrating, remembering, or making decisions?: No Patient able to express need for assistance with ADLs?: Yes Does the patient have difficulty dressing or bathing?: No Independently performs ADLs?: Yes (appropriate for developmental age) Does the patient have difficulty walking or climbing stairs?: No Weakness of Legs: None Weakness of Arms/Hands: None  Permission Sought/Granted Permission sought to share information with : Chartered certified accountant granted to share information with : Yes, Verbal Permission Granted     Permission granted to share info w AGENCY: local [place] agencies        Emotional Assessment Appearance:: Appears stated age Attitude/Demeanor/Rapport: Engaged Affect (typically observed): Pleasant Orientation: : Oriented to Self, Oriented to Place, Oriented to  Time, Oriented to Situation Alcohol / Substance Use: Not Applicable Psych Involvement: No (comment)  Admission diagnosis:  Acute decompensated heart failure ([place]) [I50.9] Patient Active Problem List   Diagnosis Date Noted  . Acute  respiratory failure with hypoxia and hypercapnia ([place]) 12/01/2018  . Acute systolic CHF (congestive heart failure) ([place]) 12/01/2018  . Left renal mass   . Pre-transplant evaluation for CKD (chronic kidney disease) 06/19/2014  . Obesity 06/19/2014  . Hyperlipidemia  06/19/2014  . Complex renal cyst 06/19/2014  . CKD (chronic kidney disease) stage 5, GFR less than 15 ml/min (HCC) 06/19/2014  . Erectile dysfunction 06/19/2014  . Renal failure (ARF), acute on chronic (HCC) 11/27/2013  . Diabetes mellitus ([place]) 11/27/2013  . HTN (hypertension) 11/27/2013  . Anemia 11/27/2013  . Metabolic acidosis 33/83/2919  . Acute renal failure ([place]) 11/27/2013  . AKI (acute kidney injury) ([place]) 11/26/2013   PCP:  Redmond School, MD Pharmacy:   [facility] [employer], [place] AT [place] [address] [place] [address] Phone: [phone] Fax: [phone]     Social Determinants of Health (SDOH) Interventions    Readmission Risk Interventions Readmission Risk Prevention Plan 12/04/2018  Transportation Screening Complete  Home Care Screening Complete  Medication Review (RN CM) Complete  Some recent data might be hidden

## 2018-12-04 NOTE — Progress Notes (Signed)
[facility] ROUNDING NOTE   Subjective:   He has been on lasix 160 mg IV every 6 hours and metolazone 10 mg daily.  Has also received potassium scheduled 40 meq TID.  He had 3.6 liters UOP over 7/5.  He feels like a lot of his fluid has come off.  He is feeling a little tired but other than that better.  He would hope to put off dialysis if possible but does want it when it is indicated.  He states not on metolazone at home and tells me home lasix is "two tablets in AM and one in PM - may be 40 mg tablets."  Review of systems:  Swelling down Urinating well  Tired  Denies shortness of breath   Background:  68 year old gentleman chronic kidney disease stage V treated by Dr. Parthenia [facility].  History of diabetes and diabetic nephropathy.  He also secondary parathyroidism hyperlipidemia anemia.  Presented with increased shortness of breath.  He was placed on high-dose IV Lasix 160 mg every 6 hours with good diuresis.    Objective:  Vital signs in last 24 hours:  Temp:  [97.7 F (36.5 C)-98.4 F (36.9 C)] 97.7 F (36.5 C) (07/06 0602) Pulse Rate:  [65-67] 65 (07/06 0602) Resp:  [17-32] 17 (07/06 0602) BP: (133-164)/(73-79) 164/79 (07/06 0602) SpO2:  [93 %-98 %] 98 % (07/06 0602) Weight:  [97.5 kg] 97.5 kg (07/06 0602)  Weight change: -1.6 kg Filed Weights   12/02/18 0450 12/03/18 0500 12/04/18 0602  Weight: 102.2 kg 99.1 kg 97.5 kg    Intake/Output: I/O last 3 completed shifts: In: 240 [P.O.:240] Out: 6200 [Urine:6200]   Intake/Output this shift:  No intake/output data recorded.  General adult male in bed in no acute distress HEENT normocephalic atraumatic extraocular movements intact sclera anicteric Neck supple trachea midline Lungs clear to auscultation bilaterally normal work of breathing at rest  Heart RRR no rub  Abdomen distended but soft and non-tender Extremities there is no lower extremity edema  Psych normal mood and  affect Access: LUE AVF bruit and thrill   Basic Metabolic Panel: Recent Labs  Lab 11/30/18 0741 12/01/18 1330 12/02/18 0426 12/03/18 0501 12/04/18 0549  NA 137 141 140 140 136  K 3.7 4.1 4.4 5.0 4.7  CL 99 101 104 100 99  CO2 22 24 22 23 22   GLUCOSE 93 196* 152* 159* 106*  BUN 55* 61* 63* 75* 92*  CREATININE 5.05* 5.29* 5.04* 5.60* 5.77*  CALCIUM 9.0  9.1 9.2 9.3 9.8 9.7  MG 2.1  --   --   --   --   PHOS 4.6  --   --   --   --     Liver Function Tests: Recent Labs  Lab 11/30/18 0741 12/01/18 1330  AST  --  15  ALT  --  15  ALKPHOS  --  42  BILITOT  --  0.8  PROT  --  8.1  ALBUMIN 3.9 4.3   No results for input(s): LIPASE, AMYLASE in the last 168 hours. No results for input(s): AMMONIA in the last 168 hours.  CBC: Recent Labs  Lab 11/30/18 0741 11/30/18 0749 12/01/18 1330  WBC 9.1  --  10.0  NEUTROABS 7.4  --  9.3*  HGB 11.2* 11.0* 12.0*  HCT 35.5*  --  38.4*  MCV 90.3  --  91.2  PLT 226  --  260    CBG: Recent Labs  Lab 12/03/18 0731  12/03/18 1124 12/03/18 1620 12/03/18 2154 12/04/18 0745  GLUCAP 152* 246* 202* 98 107*    Microbiology: Results for orders placed or performed during the hospital encounter of 12/01/18  SARS Coronavirus 2 (CEPHEID - Performed in [place] hospital lab), Hosp Order     Status: None   Collection Time: 12/01/18  1:27 PM   Specimen: Nasopharyngeal Swab  Result Value Ref Range Status   SARS Coronavirus 2 NEGATIVE NEGATIVE Final    Comment: (NOTE) If result is NEGATIVE SARS-CoV-2 target nucleic acids are NOT DETECTED. The SARS-CoV-2 RNA is generally detectable in upper and lower  respiratory specimens during the acute phase of infection. The lowest  concentration of SARS-CoV-2 viral copies this assay can detect is 250  copies / mL. A negative result does not preclude SARS-CoV-2 infection  and should not be used as the sole basis for treatment or other  patient management decisions.  A negative result may occur  with  improper specimen collection / handling, submission of specimen other  than nasopharyngeal swab, presence of viral mutation(s) within the  areas targeted by this assay, and inadequate number of viral copies  (<250 copies / mL). A negative result must be combined with clinical  observations, patient history, and epidemiological information. If result is POSITIVE SARS-CoV-2 target nucleic acids are DETECTED. The SARS-CoV-2 RNA is generally detectable in upper and lower  respiratory specimens dur ing the acute phase of infection.  Positive  results are indicative of active infection with SARS-CoV-2.  Clinical  correlation with patient history and other diagnostic information is  necessary to determine patient infection status.  Positive results do  not rule out bacterial infection or co-infection with other viruses. If result is PRESUMPTIVE POSTIVE SARS-CoV-2 nucleic acids MAY BE PRESENT.   A presumptive positive result was obtained on the submitted specimen  and confirmed on repeat testing.  While 2019 novel coronavirus  (SARS-CoV-2) nucleic acids may be present in the submitted sample  additional confirmatory testing may be necessary for epidemiological  and / or clinical management purposes  to differentiate between  SARS-CoV-2 and other Sarbecovirus currently known to infect humans.  If clinically indicated additional testing with an alternate test  methodology [phone]) is advised. The SARS-CoV-2 RNA is generally  detectable in upper and lower respiratory sp ecimens during the acute  phase of infection. The expected result is Negative. Fact Sheet for Patients:  StrictlyIdeas.no Fact Sheet for Healthcare Providers: BankingDealers.co.za This test is not yet approved or cleared by the [place] FDA and has been authorized for detection and/or diagnosis of SARS-CoV-2 by FDA under an Emergency Use Authorization (EUA).  This EUA  will remain in effect (meaning this test can be used) for the duration of the COVID-19 declaration under Section 564(b)(1) of the Act, 21 U.S.C. section 360bbb-3(b)(1), unless the authorization is terminated or revoked sooner. Performed at [facility], [address], [place], [place] [address]   MRSA PCR Screening     Status: None   Collection Time: 12/01/18  5:22 PM   Specimen: Nasal Mucosa; Nasopharyngeal  Result Value Ref Range Status   MRSA by PCR NEGATIVE NEGATIVE Final    Comment:        The GeneXpert MRSA Assay (FDA approved for NASAL specimens only), is one component of a comprehensive MRSA colonization surveillance program. It is not intended to diagnose MRSA infection nor to guide or monitor treatment for MRSA infections. Performed at [facility], [address], [place], [place] [address]  Medications:   . sodium chloride    . furosemide 160 mg (12/04/18 0355)  . nitroGLYCERIN Stopped (12/02/18 1300)   . atorvastatin  20 mg Oral QHS  . calcitRIOL  0.25 mcg Oral Daily  . doxazosin  4 mg Oral QHS  . heparin  5,000 Units Subcutaneous Q8H  . hydrALAZINE  25 mg Oral BID  . insulin aspart  0-15 Units Subcutaneous TID WC  . insulin aspart  0-5 Units Subcutaneous QHS  . insulin glargine  20 Units Subcutaneous QHS  . metolazone  10 mg Oral Daily  . polyethylene glycol  17 g Oral Daily  . potassium chloride  40 mEq Oral TID  . sodium chloride flush  3 mL Intravenous Q12H  . traZODone  100 mg Oral QHS  . verapamil  360 mg Oral QHS   sodium chloride, acetaminophen, hydrALAZINE, ondansetron (ZOFRAN) IV, sodium chloride flush  Assessment/ Plan:  # Chronic kidney disease stage V patient being actively followed at [facility].  He has an AV fistula that is mature and ready to use in his left upper extremity.  Etiology of his renal disease appears to be diabetic nephrosclerosis.   - No acute indication for dialysis however monitoring closely for need.   Marked azotemia in setting of high dose diuretics and hopefully suspending these will improve.  - Discontinue diuretics for now as above.  Anticipate restarting as early as 7/7 but will pause for now - CBC in AM    # Acute on chronic systolic CHF  - Discontinue lasix 160 mg IV every 6 hours.  Have discontinued potassium TID  - Discontinue Metolazone 10 mg daily  - Reassess diuretics tomorrow.  Near euvolemic on exam today with marked azotemia and s/p high dose diuretics hoping to hold off on HD if possible   # Hypertension - Fluid overload component improved    # Secondary hyperparathyroidism he is on calcitriol 0.25 mcg daily.  Phos acceptable last check  # Diabetes mellitus as per primary team   # Hyperlipidemia continue statins   LOS: 3 Claudia Desanctis 12/04/2018 7:57 AM

## 2018-12-04 NOTE — Care Management Important Message (Signed)
Important Message  Patient Details  Name: Steven Perez MRN: [id number] Date of Birth: May 02, 1951   Medicare Important Message Given:  Yes     [name] 12/04/2018, 10:06 AM

## 2018-12-04 NOTE — Progress Notes (Signed)
Administered SMOG enema as ordered. Patient had 1 large bowel movement which was loose and brown. Patient states he feels a little less bloated and uncomfortable. Will continue to monitor patient.

## 2018-12-04 NOTE — Evaluation (Signed)
Physical Therapy Evaluation Patient Details Name: Steven Perez MRN: [id number] DOB: [birth year] Today's Date: 12/04/2018   History of Present Illness  Steven Perez is a 68 y.o. 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  Patient seen due to shortness of breath this morning.  His shortness of breath has become increasingly worse over as the day progressed.  He called EMS who found him strongly hypoxic with oxygen sats in the mid 80s.  He was placed on nonrebreather brought to the hospital.  His shortness of breath was worse with exertion and improved with rest.  He does have end-stage renal disease and had a fistula placed approximately 2 years ago.  He has not started dialysis yet, but is on the transplant list at [facility]  Patient functioning near baseline for functional mobility and gait, limited mostly due to c/o fatigue, ambulated in hallway on room air with SpO2 at 96-97%, no loss of balance and tolerated sitting up in chair after therapy.  Patient will benefit from continued physical therapy in hospital and recommended venue below to increase strength, balance, endurance for safe ADLs and gait.     Follow Up Recommendations Home health PT    Equipment Recommendations  None recommended by PT    Recommendations for Other Services       Precautions / Restrictions Precautions Precautions: Fall Restrictions Weight Bearing Restrictions: No      Mobility  Bed Mobility Overal bed mobility: Modified Independent             General bed mobility comments: labored movement  Transfers Overall transfer level: Needs assistance Equipment used: None;1 person hand held assist Transfers: Sit to/from Stand;Stand Pivot Transfers Sit to Stand: Supervision;Min guard Stand pivot transfers: Supervision;Min guard       General transfer  comment: labored movement, increased time  Ambulation/Gait Ambulation/Gait assistance: Supervision;Min guard Gait Distance (Feet): 45 Feet Assistive device: None Gait Pattern/deviations: Decreased step length - right;Decreased step length - left;Decreased stride length;Wide base of support Gait velocity: decreased   General Gait Details: slightly labored cadence with wider base of support, no loss of balance, limited mostly due to c/o fatigue  Stairs            Wheelchair Mobility    Modified Rankin (Stroke Patients Only)       Balance Overall balance assessment: Needs assistance Sitting-balance support: Feet supported;No upper extremity supported Sitting balance-Leahy Scale: Good     Standing balance support: No upper extremity supported;During functional activity Standing balance-Leahy Scale: Fair Standing balance comment: without AD                             Pertinent Vitals/Pain Pain Assessment: No/denies pain    Home Living Family/patient expects to be discharged to:: Private residence Living Arrangements: Spouse/significant other Available Help at Discharge: Family;Available PRN/intermittently Type of Home: House Home Access: Stairs to enter Entrance Stairs-Rails: Right;Left(to wide to reach both) Entrance Stairs-Number of Steps: 3 Home Layout: One level;Laundry or work area in basement;Two level Home Equipment: None      Prior Function Level of Independence: Independent         Comments: Hydrographic surveyor, drives     Hand Dominance        Extremity/Trunk Assessment   Upper Extremity Assessment Upper Extremity Assessment: Generalized weakness  Lower Extremity Assessment Lower Extremity Assessment: Generalized weakness    Cervical / Trunk Assessment Cervical / Trunk Assessment: Normal  Communication   Communication: No difficulties  Cognition Arousal/Alertness: Awake/alert Behavior During Therapy: WFL for tasks  assessed/performed Overall Cognitive Status: Within Functional Limits for tasks assessed                                        General Comments      Exercises     Assessment/Plan    PT Assessment Patient needs continued PT services  PT Problem List Decreased strength;Decreased activity tolerance;Decreased balance;Decreased mobility       PT Treatment Interventions Balance training;Gait training;Stair training;Functional mobility training;Therapeutic activities;Therapeutic exercise;Patient/family education    PT Goals (Current goals can be found in the Care Plan section)  Acute Rehab PT Goals Patient Stated Goal: return home with spouse to assist PT Goal Formulation: With patient Time For Goal Achievement: 12/08/18 Potential to Achieve Goals: Good    Frequency Min 3X/week   Barriers to discharge        Co-evaluation               AM-PAC PT "6 Clicks" Mobility  Outcome Measure Help needed turning from your back to your side while in a flat bed without using bedrails?: None Help needed moving from lying on your back to sitting on the side of a flat bed without using bedrails?: None Help needed moving to and from a bed to a chair (including a wheelchair)?: None Help needed standing up from a chair using your arms (e.g., wheelchair or bedside chair)?: A Little Help needed to walk in hospital room?: A Little Help needed climbing 3-5 steps with a railing? : A Little 6 Click Score: 21    End of Session Equipment Utilized During Treatment: Gait belt Activity Tolerance: Patient tolerated treatment well;Patient limited by fatigue Patient left: in chair;with call bell/phone within reach Nurse Communication: Mobility status PT Visit Diagnosis: Unsteadiness on feet (R26.81);Other abnormalities of gait and mobility (R26.89);Muscle weakness (generalized) (M62.81)    Time: 4801-6553 PT Time Calculation (min) (ACUTE ONLY): 25 min   Charges:   PT  Evaluation $PT Eval Moderate Complexity: 1 Mod PT Treatments $Therapeutic Activity: 23-37 mins        2:04 PM, 12/04/18 Lonell Grandchild, MPT Physical Therapist with [facility] [phone] office [phone] mobile phone

## 2018-12-04 NOTE — Consult Note (Addendum)
Cardiology Consultation:   Patient ID: Steven Perez; [id number]; 1951/03/11   Admit date: 12/01/2018 Date of Consult: 12/04/2018  Primary Care Provider: Redmond School, MD Primary Cardiologist: Carlyle Dolly, MD New Primary Electrophysiologist:  None   Patient Profile:   Steven Perez is a 68 y.o. 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  History of Present Illness:   Steven Perez was admitted 07/03 with increasing SOB, 2nd CHF exacerbation. He was diuresed w/ high-dose Lasix. Was found to have EF 25-30% and cards asked to see.  He is breathing better than on admission, but is still very weak.   He has not had any chest pain at all.   He was gradually building up fluid in his legs and lungs prior to admission.   Not aware of any precipitating events.   Is compliant w/ meds, admits he could be more low Na/DM diet compliant. (wife does the cooking)  Describes orthopnea, denies PND. Increased DOE, but denies SOB at rest.  Feels his renal problem started about 5 years ago when he had a study that included dye.  Used to get a stress test every year because he was on the transplant list at [facility].  He is currently on the inactive list. Last one was 2019.  From review of the [facility] records, his EF was 40% 04/2018 but normal on echo.   Past Medical History:  Diagnosis Date   Acute on chronic renal insufficiency    Anemia    assoc w/ chronic reanl failure   Arthralgia    Chronic kidney disease    Stage 4   Diabetes mellitus    Dyslipidemia    Erectile dysfunction due to arterial insufficiency    HLD (hyperlipidemia)    Hypertension    Insomnia    Left renal mass    Obesity    Secondary hyperparathyroidism of renal origin ([place])    Type 2 diabetes mellitus ([place])     Past Surgical History:  Procedure  Laterality Date   AV FISTULA PLACEMENT Left 11/02/2016   Procedure: ARTERIOVENOUS (AV) FISTULA CREATION LEFT UPPER ARM;  Surgeon: Elam Dutch, MD;  Location: [facility];  Service: Vascular;  Laterality: Left;   IR GENERIC HISTORICAL  06/04/2014   IR RADIOLOGIST EVAL & MGMT 06/04/2014 Aletta Edouard, MD [facility] INTERV RAD   IR GENERIC HISTORICAL  01/15/2016   IR RADIOLOGIST EVAL & MGMT 01/15/2016 [facility] INTERV RAD   IR RADIOLOGIST EVAL & MGMT  01/25/2017   RENAL BIOPSY     RENAL BIOPSY, PERCUTANEOUS     TONSILLECTOMY       Prior to Admission medications   Medication Sig Start Date End Date Taking? Authorizing Provider  calcitRIOL (ROCALTROL) 0.25 MCG capsule Take 0.25 mcg by mouth daily.   Yes [provider]  carvedilol (COREG) 25 MG tablet Take 25 mg by mouth 2 (two) times daily.   Yes [provider]  doxazosin (CARDURA) 4 MG tablet Take 4 mg by mouth at bedtime.   Yes [provider]  epoetin alfa (EPOGEN,PROCRIT) 72620 UNIT/ML injection 10,000 Units every 30 (thirty) days.   Yes Elmarie Shiley, MD  epoetin alfa (EPOGEN,PROCRIT) 35597 UNIT/ML injection 20,000 Units every 30 (thirty) days.   Yes [provider]  furosemide (LASIX) 40 MG tablet Take 80 mg by mouth daily.  Yes [provider]  hydrALAZINE (APRESOLINE) 25 MG tablet Take 25 mg by mouth 2 (two) times daily.   Yes [provider]  Insulin Degludec (TRESIBA FLEXTOUCH) 200 UNIT/ML SOPN Inject 20 Units into the skin at bedtime.   Yes [provider]  simvastatin (ZOCOR) 40 MG tablet Take 40 mg by mouth at bedtime.    Yes [provider]  traZODone (DESYREL) 100 MG tablet Take 100 mg by mouth at bedtime.   Yes [provider]  verapamil (CALAN-SR) 180 MG CR tablet Take 2 tablets (360 mg total) by mouth at bedtime. 12/04/13  Yes Short, Noah Delaine, MD  Vitamin D, Ergocalciferol, (DRISDOL) 50000 UNITS CAPS capsule Take 50,000 Units by mouth every Wednesday.     Yes [provider]    Inpatient Medications: Scheduled Meds:  atorvastatin  20 mg Oral QHS   calcitRIOL  0.25 mcg Oral Daily   doxazosin  4 mg Oral QHS   heparin  5,000 Units Subcutaneous Q8H   hydrALAZINE  25 mg Oral BID   insulin aspart  0-15 Units Subcutaneous TID WC   insulin aspart  0-5 Units Subcutaneous QHS   insulin glargine  20 Units Subcutaneous QHS   polyethylene glycol  17 g Oral Daily   sodium chloride flush  3 mL Intravenous Q12H   sorbitol, milk of mag, mineral oil, glycerin (SMOG) enema  960 mL Rectal Once   traZODone  100 mg Oral QHS   verapamil  360 mg Oral QHS   Continuous Infusions:  sodium chloride     nitroGLYCERIN Stopped (12/02/18 1300)   PRN Meds: sodium chloride, acetaminophen, hydrALAZINE, ondansetron (ZOFRAN) IV, sodium chloride flush  Allergies:    Allergies  Allergen Reactions   Contrast Media [Iodinated Diagnostic Agents] Other (See Comments)    Renal failure after administration of CT contrast     Tetanus Toxoids Swelling    "bad reaction;" told not to take this again    Social History:   Social History   Socioeconomic History   Marital status: Married    Spouse name: Not on file   Number of children: Not on file   Years of education: Not on file   Highest education level: Not on file  Occupational History   Not on file  Social Needs   Financial resource strain: Not on file   Food insecurity    Worry: Not on file    Inability: Not on file   Transportation needs    Medical: Not on file    Non-medical: Not on file  Tobacco Use   Smoking status: Former Smoker   Smokeless tobacco: Never Used  Substance and Sexual Activity   Alcohol use: No    Comment: occasionally   Drug use: No   Sexual activity: Yes  Lifestyle   Physical activity    Days per week: Not on file    Minutes per session: Not on file   Stress: Not on file  Relationships   Social connections    Talks on phone: Not on  file    Gets together: Not on file    Attends religious service: Not on file    Active member of club or organization: Not on file    Attends meetings of clubs or organizations: Not on file    Relationship status: Not on file   Intimate partner violence    Fear of current or ex partner: Not on file    Emotionally abused: Not on file  Physically abused: Not on file    Forced sexual activity: Not on file  Other Topics Concern   Not on file  Social History Narrative   Not on file    Family History:   Family History  Problem Relation Age of Onset   Diabetes Mellitus II Mother    CAD Neg Hx    Stroke Neg Hx    Family Status:  Family Status  Relation Name Status   Mother  (Not Specified)   Neg Hx  (Not Specified)    ROS:  Please see the history of present illness.  All other ROS reviewed and negative.     Physical Exam/Data:   Vitals:   12/03/18 1000 12/03/18 1138 12/03/18 2200 12/04/18 0602  BP: 133/73  140/74 (!) 164/79  Pulse: 67 67 66 65  Resp: 18 (!) 32 20 17  Temp:  98.4 F (36.9 C) 98.1 F (36.7 C) 97.7 F (36.5 C)  TempSrc:  Oral Oral Oral  SpO2: 93% 94% 96% 98%  Weight:    97.5 kg  Height:        Intake/Output Summary (Last 24 hours) at 12/04/2018 0856 Last data filed at 12/04/2018 0602 Gross per 24 hour  Intake 240 ml  Output 3550 ml  Net -3310 ml   Filed Weights   12/02/18 0450 12/03/18 0500 12/04/18 0602  Weight: 102.2 kg 99.1 kg 97.5 kg   Body mass index is 32.68 kg/m.  General:  Well nourished, well developed, in no acute distress HEENT: normal Lymph: no adenopathy Neck: no JVD Endocrine:  No thryomegaly Vascular: No carotid bruits; 4/4 extremity pulses 2+, without bruits  Cardiac:  normal S1, S2; RRR; no murmur  Lungs:  clear to auscultation bilaterally, no wheezing, rhonchi or rales  Abd: soft, nontender, no hepatomegaly  Ext: no edema Musculoskeletal:  No deformities, BUE and BLE strength normal and equal Skin: warm and dry    Neuro:  CNs 2-12 intact, no focal abnormalities noted Psych:  Normal affect   EKG:  The EKG was personally reviewed and demonstrates: 12/01/2018 ECG is sinus rhythm, heart rate 83, borderline left bundle with QRS duration 121 ms Telemetry:  Telemetry was personally reviewed and demonstrates: Sinus rhythm with multifocal PVCs  Relevant CV Studies:  ECHO: 05/02/2018 [facility]) SUMMARY The left ventricle is mildly dilated. There is normal left ventricular wall thickness.  Left ventricular systolic function is mildly reduced. LV ejection fraction = 40-45%.  Left ventricular filling pattern is pseudonormal. Hypokinesis of the mid anterolateral/inferolateral wall; other segments  appear preserved. The right ventricle is normal in size and function. There is mild to moderate mitral regurgitation. There is mild tricuspid regurgitation. The aortic root is normal size. There is no pericardial effusion. Compared to prior study, there has been a slight decline in LV systolic  performance.  ECHO: 12/01/2018  1. The left ventricle has severely reduced systolic function, with an ejection fraction of 25-30%. The cavity size was normal. There is moderate concentric left ventricular hypertrophy. Left ventricular diastolic Doppler parameters are consistent with  restrictive filling. Elevated left atrial and left ventricular end-diastolic pressures Left ventricular diffuse hypokinesis.  2. The right ventricle has mildly reduced systolic function. The cavity was moderately enlarged. There is no increase in right ventricular wall thickness.  3. Left atrial size was moderately dilated.  4. Right atrial size was mildly dilated.  5. The aortic valve is tricuspid. Aortic valve regurgitation was not assessed by color flow Doppler.  MYOVIEW: 05/02/2018  FINDINGS:  Unexpected: None.  Perfusion defects: No reversible perfusion defects. Decreased activity in the inferior wall  improves on stress imaging and  may be artifactual.  TID ratio: <1.25; no transient ischemic dilatation on visual analysis.  Gated images:  Likely spurious due to adjacent bowel activity. Unable to interpret. Defer to ejection fraction and wall motion analysis on the echocardiogram performed today.  CONCLUSION:  No inducible ischemia.      Laboratory Data:  Chemistry Recent Labs  Lab 12/02/18 0426 12/03/18 0501 12/04/18 0549  NA 140 140 136  K 4.4 5.0 4.7  CL 104 100 99  CO2 22 23 22   GLUCOSE 152* 159* 106*  BUN 63* 75* 92*  CREATININE 5.04* 5.60* 5.77*  CALCIUM 9.3 9.8 9.7  GFRNONAA 11* 10* 9*  GFRAA 13* 11* 11*  ANIONGAP 14 17* 15    Lab Results  Component Value Date   ALT 15 12/01/2018   AST 15 12/01/2018   ALKPHOS 42 12/01/2018   BILITOT 0.8 12/01/2018   Hematology Recent Labs  Lab 11/30/18 0741 11/30/18 0749 12/01/18 1330  WBC 9.1  --  10.0  RBC 3.93*  --  4.21*  HGB 11.2* 11.0* 12.0*  HCT 35.5*  --  38.4*  MCV 90.3  --  91.2  MCH 28.5  --  28.5  MCHC 31.5  --  31.3  RDW 13.1  --  13.3  PLT 226  --  260    TSH:  Lab Results  Component Value Date   TSH 1.429 12/02/2018    Magnesium:  Magnesium  Date Value Ref Range Status  11/30/2018 2.1 1.7 - 2.4 mg/dL Final    Comment:    Performed at [facility], [address] [place], [place] [address]     Radiology/Studies:  Dg Chest Portable 1 View  Result Date: 12/01/2018 CLINICAL DATA:  Shortness of breath. EXAM: PORTABLE CHEST 1 VIEW COMPARISON:  Radiograph of November 26, 2013. FINDINGS: Mild cardiomegaly is noted. Interval development of interstitial densities are noted throughout both lungs concerning for pulmonary edema. Small pleural effusions may be present. No pneumothorax is noted. Bony thorax is unremarkable. IMPRESSION: Cardiomegaly is noted with bilateral pulmonary edema and small pleural effusions. Electronically Signed   By: Marijo Conception M.D.   On: 12/01/2018 13:50    Assessment and Plan:   1.  Acute on  chronic combined systolic and diastolic CHF: - Last dose of Lasix was early this a.m., it has been DC'd.  He was getting 160 mg IV every 6 hours -We will leave diuretic dosing to Nephrology - Feel that his volume status is at baseline, weight is 215 pounds or 97.5 kg -Needs to pay close attention to the amount of sodium in the food that he eats and do daily weights.  Follow-up with nephrology  2.  Cardiomyopathy: - At his last echocardiogram at [facility] 04/2018, his EF was 40% but no wall motion abnormalities and a Myoview was without ischemia - On his current echo, he has biventricular dysfunction but no wall motion abnormalities. - As he is not yet on dialysis, would not pursue cath at this time - Review with MD to decide if a Myoview for risk stratification is indicated -Prior to admission he was on verapamil 360 mg daily plus carvedilol 25 mg daily and hydralazine 25 mg twice daily. - Add nitrates, Imdur 30 mg daily instead of BiDil for cost savings -Because of his decreased EF, verapamil is not the best drug and he is  already on a high dose of carvedilol. -Review with MD to decide best options here.  3.  Acute on chronic renal failure - Prior to admission baseline creatinine 4.8 or so - Creatinine 5.05 on admission and now up to 5.77 with diuresis - Management per nephrology, fistula is 68 years old  Otherwise, per IM and consultants Active Problems:   Diabetes mellitus ([place])   HTN (hypertension)   CKD (chronic kidney disease) stage 5, GFR less than 15 ml/min (HCC)   Acute respiratory failure with hypoxia and hypercapnia (HCC)   Acute systolic CHF (congestive heart failure) ([place])     For questions or updates, please contact [facility] Please consult [email or web address] for contact info under Cardiology/STEMI.   SignedRosaria Ferries, PA-C  12/04/2018 8:56 AM Attending note   Patient seen and discussed with PA Barrett, I agree with her documentation above. 68 yo male history  of CKD with fistula but has not started HD as of yet, HTN, DM, prior history of mildly reduced LVEF ([facility] echo 04/2018 LVEF 40-45%)admitted with SOB and hypoxia. Noted indicate O2 sats mid 80s by EMS. In ER placed on bipap   Admit labs:  K 4.1 Cr 5.29 BUN 61 WBC 10 Hgb 12 plt 260 TSH 1.4  ABG 7.27/54.7/102/22 COVID neg CXR cardiomegaly, pulm edema 12/02/18 echo: LVEF 25-30%, restrictive diastolic function, mild RV dysfunction, mod LAE 04/2018 nuclear stress [facility]: no ischemia.     Acute systolic HF. Last echo at [facility] 04/2018 LVEF 40-45%, during this admission LVEF 25-30% with restrictive diastolic dysfunction. Negative 7.1 L this admission. We defer diuretic dosing to nephrology given his advanced kidney disease. No ACE/ARB/ARNI/aldactone until he is committed to HD. Stop verapmail given systolic dysfunction, start coreg 6.25mg  bid.Appears held on admission, had been on 25mg  bid, start lower dose and titrate as tolerated.  Add imdur 15mg  to use in conjunction with his oral hydralazine he is already on. Normal stress test 04/2018, if committed to HD in the future could consider definitive cath due to worsening LV function, I would defer to his primary cardiologist to decide in the future, there is not an acute indication for cath as inpatient at this time.   History of advanced kidney disease, has fistula but had not started HD as of yet as outpatient. Seen by nephrology this admit, high dose lasix 160mg  every 6 hrs per nephrology recs along with metolazone, we will defer diuretics to neprhology. From notes no plans to start HD at this time. Diuretics held today per renal.    No additional cardiology recs at this time, we will follow his vitals and tele tomorrow with med changes today. He actually prefers to establish locally with our clinic at follow up, we will arrange.    Zandra Abts MD

## 2018-12-05 LAB — CBC
HCT: 41 % (ref 39.0–52.0)
Hemoglobin: 13.3 g/dL (ref 13.0–17.0)
MCH: 28.9 pg (ref 26.0–34.0)
MCHC: 32.4 g/dL (ref 30.0–36.0)
MCV: 88.9 fL (ref 80.0–100.0)
Platelets: 286 10*3/uL (ref 150–400)
RBC: 4.61 MIL/uL (ref 4.22–5.81)
RDW: 13 % (ref 11.5–15.5)
WBC: 13.9 10*3/uL — ABNORMAL HIGH (ref 4.0–10.5)
nRBC: 0 % (ref 0.0–0.2)

## 2018-12-05 LAB — GLUCOSE, CAPILLARY
Glucose-Capillary: 121 mg/dL — ABNORMAL HIGH (ref 70–99)
Glucose-Capillary: 123 mg/dL — ABNORMAL HIGH (ref 70–99)
Glucose-Capillary: 136 mg/dL — ABNORMAL HIGH (ref 70–99)
Glucose-Capillary: 136 mg/dL — ABNORMAL HIGH (ref 70–99)

## 2018-12-05 LAB — BASIC METABOLIC PANEL
Anion gap: 17 — ABNORMAL HIGH (ref 5–15)
BUN: 100 mg/dL — ABNORMAL HIGH (ref 8–23)
CO2: 26 mmol/L (ref 22–32)
Calcium: 9.2 mg/dL (ref 8.9–10.3)
Chloride: 94 mmol/L — ABNORMAL LOW (ref 98–111)
Creatinine, Ser: 5.99 mg/dL — ABNORMAL HIGH (ref 0.61–1.24)
GFR calc Af Amer: 10 mL/min — ABNORMAL LOW (ref >60)
GFR calc non Af Amer: 9 mL/min — ABNORMAL LOW (ref >60)
Glucose, Bld: 104 mg/dL — ABNORMAL HIGH (ref 70–99)
Potassium: 3.4 mmol/L — ABNORMAL LOW (ref 3.5–5.1)
Sodium: 137 mmol/L (ref 135–145)

## 2018-12-05 LAB — HIV ANTIBODY (ROUTINE TESTING W REFLEX): HIV Screen 4th Generation wRfx: NONREACTIVE

## 2018-12-05 MED ORDER — CARVEDILOL 12.5 MG PO TABS
12.5000 mg | ORAL_TABLET | Freq: Two times a day (BID) | ORAL | Status: DC
  Administered 2018-12-05 – 2018-12-07 (×3): 12.5 mg via ORAL
  Filled 2018-12-05 (×3): qty 1

## 2018-12-05 NOTE — Progress Notes (Signed)
[facility] NEPHROLOGY PROGRESS NOTE  Assessment/ Plan: Pt is a 68 y.o. yo male with history of CKD stage V follows with Dr. Posey Pronto at [facility], hypertension, DM, CHF admitted with shortness of breath and fluid overload.  Treated with high-dose IV diuretics and metolazone.   #CKD stage V thought to be due to diabetic nephropathy: Received IV Lasix and metolazone with significant improvement in lower extremity edema.  He is euvolemic on exam.  Lasix has been on hold since yesterday afternoon.  Urine output of 3.7 L in 24-hour.  Serum creatinine level trending up to 5.9, BUN 100 and potassium 3.4 today.  He has some generalized weakness and mild nausea.  Discussed with the patient about possibly starting dialysis soon.  He has left upper extremity AV fistula maturing well.  Plan to hold off diuretics today and assess in the morning.  He is agreed with the plan.  #Acute on chronic systolic CHF: EF 96%.  Initially treated with Lasix 160 mg IV every 6 hours and metolazone 10 mg daily.  He is euvolemic.  Seen by cardiology.  On Coreg  # Anemia due to CKD: Hemoglobin at goal.  # Secondary hyperparathyroidism: Check phosphorus level.  On calcitriol.  # HTN/volume; euvolemic.  Continue current medication.  Holding diuretics as discussed above.  Subjective: Seen and examined at bedside.  Reports some weakness and mild nausea earlier today.  No edema.  Good urine output.  Denies headache, dizziness, chest pain, shortness of breath.  No dysgeusia. Objective Vital signs in last 24 hours: Vitals:   12/04/18 1116 12/04/18 1445 12/04/18 2112 12/05/18 0647  BP:  (!) 159/85 (!) 171/84 (!) 176/98  Pulse: 66 65 65 71  Resp:  20 17 16   Temp:  98 F (36.7 C) 98.3 F (36.8 C)   TempSrc:  Oral Oral   SpO2:  96% 97% 97%  Weight:      Height:       Weight change:   Intake/Output Summary (Last 24 hours) at 12/05/2018 0920 Last data filed at 12/05/2018 0700 Gross per 24 hour  Intake 720 ml  Output  3750 ml  Net -3030 ml       Labs: Basic Metabolic Panel: Recent Labs  Lab 11/30/18 0741  12/03/18 0501 12/04/18 0549 12/05/18 0444  NA 137   < > 140 136 137  K 3.7   < > 5.0 4.7 3.4*  CL 99   < > 100 99 94*  CO2 22   < > 23 22 26   GLUCOSE 93   < > 159* 106* 104*  BUN 55*   < > 75* 92* 100*  CREATININE 5.05*   < > 5.60* 5.77* 5.99*  CALCIUM 9.0  9.1   < > 9.8 9.7 9.2  PHOS 4.6  --   --   --   --    < > = values in this interval not displayed.   Liver Function Tests: Recent Labs  Lab 11/30/18 0741 12/01/18 1330  AST  --  15  ALT  --  15  ALKPHOS  --  42  BILITOT  --  0.8  PROT  --  8.1  ALBUMIN 3.9 4.3   No results for input(s): LIPASE, AMYLASE in the last 168 hours. No results for input(s): AMMONIA in the last 168 hours. CBC: Recent Labs  Lab 11/30/18 0741 11/30/18 0749 12/01/18 1330 12/05/18 0444  WBC 9.1  --  10.0 13.9*  NEUTROABS 7.4  --  9.3*  --  HGB 11.2* 11.0* 12.0* 13.3  HCT 35.5*  --  38.4* 41.0  MCV 90.3  --  91.2 88.9  PLT 226  --  260 286   Cardiac Enzymes: No results for input(s): CKTOTAL, CKMB, CKMBINDEX, TROPONINI in the last 168 hours. CBG: Recent Labs  Lab 12/04/18 0745 12/04/18 1144 12/04/18 1623 12/04/18 2113 12/05/18 0750  GLUCAP 107* 113* 111* 138* 121*    Iron Studies: No results for input(s): IRON, TIBC, TRANSFERRIN, FERRITIN in the last 72 hours. Studies/Results: No results found.  Medications: Infusions: . sodium chloride      Scheduled Medications: . atorvastatin  20 mg Oral QHS  . calcitRIOL  0.25 mcg Oral Daily  . carvedilol  12.5 mg Oral BID WC  . doxazosin  4 mg Oral QHS  . heparin  5,000 Units Subcutaneous Q8H  . hydrALAZINE  25 mg Oral BID  . insulin aspart  0-15 Units Subcutaneous TID WC  . insulin aspart  0-5 Units Subcutaneous QHS  . insulin glargine  20 Units Subcutaneous QHS  . isosorbide mononitrate  15 mg Oral Daily  . polyethylene glycol  17 g Oral Daily  . sodium chloride flush  3 mL  Intravenous Q12H  . temazepam  15 mg Oral QHS    have reviewed scheduled and prn medications.  Physical Exam: General:NAD, comfortable Heart:RRR, s1s2 nl, no rubs Lungs:clear b/l, no crackle Abdomen:soft, Non-tender, non-distended Extremities:No LE edema Dialysis Access: Left upper extremity AV fistula, good thrill and bruit. Skin: No rash or ulcer.  Vail Vuncannon Prasad Nakota Elsen 12/05/2018,9:20 AM  LOS: 4 days  Pager: [phone]

## 2018-12-05 NOTE — Progress Notes (Addendum)
Physical Therapy Treatment Patient Details Name: Steven Perez MRN: [id number] DOB: 08-03-1950 Today's Date: 12/05/2018    History of Present Illness Steven Perez is a 68 y.o. 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  Patient seen due to shortness of breath this morning.  His shortness of breath has become increasingly worse over as the day progressed.  He called EMS who found him strongly hypoxic with oxygen sats in the mid 80s.  He was placed on nonrebreather brought to the hospital.  His shortness of breath was worse with exertion and improved with rest.  He does have end-stage renal disease and had a fistula placed approximately 2 years ago.  He has not started dialysis yet, but is on the transplant list at [facility].    PT Comments    Patient agreeable for therapy and states he sat up in chair earlier today.  Patient demonstrates good return for completing BLE ROM/strengthening exercises seated at bedside, increased endurance/distance for gait training without loss of balance with increased speed of cadence.  Patient declined to sit up in chair due to c/o of fatigue.  Patient will benefit from continued physical therapy in hospital and recommended venue below to increase strength, balance, endurance for safe ADLs and gait.    Follow Up Recommendations  Home health PT;Supervision - Intermittent     Equipment Recommendations  None recommended by PT    Recommendations for Other Services       Precautions / Restrictions Precautions Precautions: Fall Restrictions Weight Bearing Restrictions: No    Mobility  Bed Mobility Overal bed mobility: Modified Independent             General bed mobility comments: increased time  Transfers Overall transfer level: Needs assistance Equipment used: None Transfers: Sit to/from Stand;Stand Pivot Transfers Sit to  Stand: Supervision Stand pivot transfers: Supervision       General transfer comment: slightly labored movement  Ambulation/Gait Ambulation/Gait assistance: Supervision Gait Distance (Feet): 120 Feet Assistive device: None Gait Pattern/deviations: Decreased step length - right;Decreased step length - left;Decreased stride length Gait velocity: decreased   General Gait Details: increased endurance/distance for ambulatioon with slightly labord cadence, no loss of balance, demonstrated normal base of support   Stairs             Wheelchair Mobility    Modified Rankin (Stroke Patients Only)       Balance Overall balance assessment: No apparent balance deficits (not formally assessed)                                          Cognition Arousal/Alertness: Awake/alert Behavior During Therapy: WFL for tasks assessed/performed Overall Cognitive Status: Within Functional Limits for tasks assessed                                        Exercises General Exercises - Lower Extremity Long Arc Quad: Seated;AROM;Strengthening;15 reps;Both Hip Flexion/Marching: Seated;AROM;Strengthening;15 reps;Both Toe Raises: Seated;AROM;Strengthening;15 reps;Both Heel Raises: Seated;AROM;Strengthening;15 reps;Both    General Comments        Pertinent Vitals/Pain Pain Assessment: No/denies pain    Home Living  Prior Function            PT Goals (current goals can now be found in the care plan section) Acute Rehab PT Goals Patient Stated Goal: return home with spouse to assist PT Goal Formulation: With patient Time For Goal Achievement: 12/08/18 Potential to Achieve Goals: Good Progress towards PT goals: Progressing toward goals    Frequency    Min 3X/week      PT Plan Current plan remains appropriate    Co-evaluation              AM-PAC PT "6 Clicks" Mobility   Outcome Measure  Help needed  turning from your back to your side while in a flat bed without using bedrails?: None Help needed moving from lying on your back to sitting on the side of a flat bed without using bedrails?: None Help needed moving to and from a bed to a chair (including a wheelchair)?: None Help needed standing up from a chair using your arms (e.g., wheelchair or bedside chair)?: None Help needed to walk in hospital room?: A Little Help needed climbing 3-5 steps with a railing? : A Little 6 Click Score: 22    End of Session Equipment Utilized During Treatment: Gait belt Activity Tolerance: Patient tolerated treatment well Patient left: in bed;with call bell/phone within reach Nurse Communication: Mobility status PT Visit Diagnosis: Unsteadiness on feet (R26.81);Other abnormalities of gait and mobility (R26.89);Muscle weakness (generalized) (M62.81)     Time: 1364-3837 PT Time Calculation (min) (ACUTE ONLY): 29 min  Charges:  $Gait Training: 8-22 mins $Therapeutic Exercise: 8-22 mins                     3:27 PM, 12/05/18 Lonell Grandchild, MPT Physical Therapist with [facility] [phone] office [phone] mobile phone

## 2018-12-05 NOTE — Progress Notes (Signed)
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 Patient seen due to shortness of breath this morning. His shortness of breath has become increasingly worse over as the day progressed. He called EMS who found him strongly hypoxic with oxygen sats in the mid 80s. He was placed on nonrebreather brought to the hospital. His shortness of breath was worse with exertion and improved with rest. He does have end-stage renal disease and had a fistula placed approximately 2 years ago. He has not started dialysis yet, but is on the transplant list at [facility].  Patient was admitted with acute hypercapnic and hypoxemic respiratory failure secondary to acute on chronic systolic congestive heart failure in the setting of end-stage renal disease. He has been started on aggressive IV Lasix dosing with some diuresis noted thus far.He is steadily improving on 7/5 and will be transferred to telemetry today. He has been weaned off nitroglycerin drip and has good urine output noted.  On 7/6, further diuresis has been held by nephrology at this time.  They are still evaluating need for hemodialysis while inpatient.  Cardiology has evaluated the patient and recommends initiation of Imdur as well as Coreg.    PT recommends home with home health on discharge.  Nephrology still following to see if hemodialysis needs to be initiated.  Assessment & Plan:   Active Problems:   Diabetes mellitus (HCC)   HTN (hypertension)   CKD (chronic kidney disease) stage 5, GFR less than 15 ml/min (HCC)   Acute respiratory failure with hypoxia and hypercapnia (HCC)   Acute systolic CHF (congestive heart failure) (HCC)  Acute combined respiratory failure  secondary to acute on chronic systolic congestive heart failure exacerbation with volume overload -Diuresis held by nephrology today; will reassess tomorrow -2D echocardiogramwith LVEF 25-30% noted and moderate LVH. Diffuse hypokinesis.   Appreciate cardiology recommendations as noted below. -PQD8.264 -Strict I's and O's and daily weightsongoing, -1700 fluid output noted overnight -Repeat BMP in a.m.  ESRD -Nephrology following and will initiate dialysis as needed -Follow labs with IV diuresis held for now.  Hypertension -Still elevated, but improved control noted -Patient off nitroglycerin -Imdur and Coreg added by cardiology, 7/7 Coreg dose increased to 12.5 mg twice daily  Diabetes -Blood glucose readings currently stable -Continue SSI and long-acting insulin -Carb modified diet  Constipation -Patient noted to have a bowel movement after enema yesterday   DVT prophylaxis:Heparin Code Status:Full Family Communication:None at bedside Disposition Plan:Appreciate nephrology and cardiology recommendations with further IV Lasix being held at this time and close monitoring of symptomatology.    Nephrology still monitoring for evaluation of dialysis.  Likely discharge to home with home health in the next 24 to 48 hours if no plans for dialysis noted.  Patient requires inpatient stay due to IV Lasix use and blood pressure control.   Consultants:  Nephrology  Cardiology  Procedures:  None  Antimicrobials:   None  Subjective: Patient seen and evaluated today with no new acute complaints or concerns. No acute concerns or events noted overnight.  He denies any nausea or vomiting, but has poor appetite and is still feeling somewhat weak.  Objective: Vitals:   12/04/18 1445 12/04/18 2112 12/05/18 0647 12/05/18 0920  BP: (!) 159/85 (!) 171/84 (!) 176/98 Marland Kitchen)  177/98  Pulse: 65 65 71   Resp: 20 17 16    Temp: 98 F (36.7 C) 98.3 F (36.8 C)    TempSrc:  Oral Oral    SpO2: 96% 97% 97%   Weight:      Height:        Intake/Output Summary (Last 24 hours) at 12/05/2018 1045 Last data filed at 12/05/2018 0900 Gross per 24 hour  Intake 960 ml  Output 3750 ml  Net -2790 ml   Filed Weights   12/02/18 0450 12/03/18 0500 12/04/18 0602  Weight: 102.2 kg 99.1 kg 97.5 kg    Examination:  General exam: Appears calm and comfortable  Respiratory system: Clear to auscultation. Respiratory effort normal. Cardiovascular system: S1 & S2 heard, RRR. No JVD, murmurs, rubs, gallops or clicks. No pedal edema. Gastrointestinal system: Abdomen is nondistended, soft and nontender. No organomegaly or masses felt. Normal bowel sounds heard. Central nervous system: Alert and oriented. No focal neurological deficits. Extremities: Symmetric 5 x 5 power. Skin: No rashes, lesions or ulcers Psychiatry: Judgement and insight appear normal. Mood & affect appropriate.     Data Reviewed: I have personally reviewed following labs and imaging studies  CBC: Recent Labs  Lab 11/30/18 0741 11/30/18 0749 12/01/18 1330 12/05/18 0444  WBC 9.1  --  10.0 13.9*  NEUTROABS 7.4  --  9.3*  --   HGB 11.2* 11.0* 12.0* 13.3  HCT 35.5*  --  38.4* 41.0  MCV 90.3  --  91.2 88.9  PLT 226  --  260 277   Basic Metabolic Panel: Recent Labs  Lab 11/30/18 0741 12/01/18 1330 12/02/18 0426 12/03/18 0501 12/04/18 0549 12/05/18 0444  NA 137 141 140 140 136 137  K 3.7 4.1 4.4 5.0 4.7 3.4*  CL 99 101 104 100 99 94*  CO2 22 24 22 23 22 26   GLUCOSE 93 196* 152* 159* 106* 104*  BUN 55* 61* 63* 75* 92* 100*  CREATININE 5.05* 5.29* 5.04* 5.60* 5.77* 5.99*  CALCIUM 9.0  9.1 9.2 9.3 9.8 9.7 9.2  MG 2.1  --   --   --   --   --   PHOS 4.6  --   --   --   --   --    GFR: Estimated Creatinine Clearance: 13.4 mL/min (A) (by C-G formula based on SCr of 5.99 mg/dL (H)). Liver Function Tests: Recent Labs  Lab 11/30/18 0741 12/01/18 1330  AST  --  15  ALT  --  15  ALKPHOS  --  42   BILITOT  --  0.8  PROT  --  8.1  ALBUMIN 3.9 4.3   No results for input(s): LIPASE, AMYLASE in the last 168 hours. No results for input(s): AMMONIA in the last 168 hours. Coagulation Profile: No results for input(s): INR, PROTIME in the last 168 hours. Cardiac Enzymes: No results for input(s): CKTOTAL, CKMB, CKMBINDEX, TROPONINI in the last 168 hours. BNP (last 3 results) No results for input(s): PROBNP in the last 8760 hours. HbA1C: No results for input(s): HGBA1C in the last 72 hours. CBG: Recent Labs  Lab 12/04/18 0745 12/04/18 1144 12/04/18 1623 12/04/18 2113 12/05/18 0750  GLUCAP 107* 113* 111* 138* 121*   Lipid Profile: No results for input(s): CHOL, HDL, LDLCALC, TRIG, CHOLHDL, LDLDIRECT in the last 72 hours. Thyroid Function Tests: Recent Labs    12/02/18 1208  TSH 1.429   Anemia Panel: No results for input(s): VITAMINB12, FOLATE, FERRITIN, TIBC, IRON, RETICCTPCT in the  last 72 hours. Sepsis Labs: No results for input(s): PROCALCITON, LATICACIDVEN in the last 168 hours.  Recent Results (from the past 240 hour(s))  SARS Coronavirus 2 (CEPHEID - Performed in [place] hospital lab), Hosp Order     Status: None   Collection Time: 12/01/18  1:27 PM   Specimen: Nasopharyngeal Swab  Result Value Ref Range Status   SARS Coronavirus 2 NEGATIVE NEGATIVE Final    Comment: (NOTE) If result is NEGATIVE SARS-CoV-2 target nucleic acids are NOT DETECTED. The SARS-CoV-2 RNA is generally detectable in upper and lower  respiratory specimens during the acute phase of infection. The lowest  concentration of SARS-CoV-2 viral copies this assay can detect is 250  copies / mL. A negative result does not preclude SARS-CoV-2 infection  and should not be used as the sole basis for treatment or other  patient management decisions.  A negative result may occur with  improper specimen collection / handling, submission of specimen other  than nasopharyngeal swab, presence of viral  mutation(s) within the  areas targeted by this assay, and inadequate number of viral copies  (<250 copies / mL). A negative result must be combined with clinical  observations, patient history, and epidemiological information. If result is POSITIVE SARS-CoV-2 target nucleic acids are DETECTED. The SARS-CoV-2 RNA is generally detectable in upper and lower  respiratory specimens dur ing the acute phase of infection.  Positive  results are indicative of active infection with SARS-CoV-2.  Clinical  correlation with patient history and other diagnostic information is  necessary to determine patient infection status.  Positive results do  not rule out bacterial infection or co-infection with other viruses. If result is PRESUMPTIVE POSTIVE SARS-CoV-2 nucleic acids MAY BE PRESENT.   A presumptive positive result was obtained on the submitted specimen  and confirmed on repeat testing.  While 2019 novel coronavirus  (SARS-CoV-2) nucleic acids may be present in the submitted sample  additional confirmatory testing may be necessary for epidemiological  and / or clinical management purposes  to differentiate between  SARS-CoV-2 and other Sarbecovirus currently known to infect humans.  If clinically indicated additional testing with an alternate test  methodology [phone]) is advised. The SARS-CoV-2 RNA is generally  detectable in upper and lower respiratory sp ecimens during the acute  phase of infection. The expected result is Negative. Fact Sheet for Patients:  StrictlyIdeas.no Fact Sheet for Healthcare Providers: BankingDealers.co.za This test is not yet approved or cleared by the [place] FDA and has been authorized for detection and/or diagnosis of SARS-CoV-2 by FDA under an Emergency Use Authorization (EUA).  This EUA will remain in effect (meaning this test can be used) for the duration of the COVID-19 declaration under Section 564(b)(1)  of the Act, 21 U.S.C. section 360bbb-3(b)(1), unless the authorization is terminated or revoked sooner. Performed at [facility], [address], [place], [place] [address]   MRSA PCR Screening     Status: None   Collection Time: 12/01/18  5:22 PM   Specimen: Nasal Mucosa; Nasopharyngeal  Result Value Ref Range Status   MRSA by PCR NEGATIVE NEGATIVE Final    Comment:        The GeneXpert MRSA Assay (FDA approved for NASAL specimens only), is one component of a comprehensive MRSA colonization surveillance program. It is not intended to diagnose MRSA infection nor to guide or monitor treatment for MRSA infections. Performed at [facility], [address], [place], [place] [address]  Radiology Studies: No results found.      Scheduled Meds: . atorvastatin  20 mg Oral QHS  . calcitRIOL  0.25 mcg Oral Daily  . carvedilol  12.5 mg Oral BID WC  . doxazosin  4 mg Oral QHS  . heparin  5,000 Units Subcutaneous Q8H  . hydrALAZINE  25 mg Oral BID  . insulin aspart  0-15 Units Subcutaneous TID WC  . insulin aspart  0-5 Units Subcutaneous QHS  . insulin glargine  20 Units Subcutaneous QHS  . isosorbide mononitrate  15 mg Oral Daily  . polyethylene glycol  17 g Oral Daily  . sodium chloride flush  3 mL Intravenous Q12H  . temazepam  15 mg Oral QHS   Continuous Infusions: . sodium chloride       LOS: 4 days    Time spent: 30 minutes    Rotunda Worden Darleen Crocker, DO [facility] Pager [phone]  If 7PM-7AM, please contact night-coverage [email or web address] Password [email or web address] 12/05/2018, 10:45 AM

## 2018-12-05 NOTE — Progress Notes (Signed)
Vitals and tele reviewed, we will increase coreg to 12.5mg  bid today. Diuretics per nephrology. No plans for ischemic testing at this time. Room to titrate coreg and hydral further as needed for high bp's, verapamil stopped yesterday due to low EF.    Zandra Abts MD

## 2018-12-06 LAB — MAGNESIUM: Magnesium: 2.3 mg/dL (ref 1.7–2.4)

## 2018-12-06 LAB — BASIC METABOLIC PANEL
Anion gap: 14 (ref 5–15)
BUN: 100 mg/dL — ABNORMAL HIGH (ref 8–23)
CO2: 26 mmol/L (ref 22–32)
Calcium: 9 mg/dL (ref 8.9–10.3)
Chloride: 94 mmol/L — ABNORMAL LOW (ref 98–111)
Creatinine, Ser: 5.88 mg/dL — ABNORMAL HIGH (ref 0.61–1.24)
GFR calc Af Amer: 10 mL/min — ABNORMAL LOW (ref >60)
GFR calc non Af Amer: 9 mL/min — ABNORMAL LOW (ref >60)
Glucose, Bld: 90 mg/dL (ref 70–99)
Potassium: 2.8 mmol/L — ABNORMAL LOW (ref 3.5–5.1)
Sodium: 134 mmol/L — ABNORMAL LOW (ref 135–145)

## 2018-12-06 LAB — GLUCOSE, CAPILLARY
Glucose-Capillary: 88 mg/dL (ref 70–99)
Glucose-Capillary: 118 mg/dL — ABNORMAL HIGH (ref 70–99)
Glucose-Capillary: 96 mg/dL (ref 70–99)
Glucose-Capillary: 116 mg/dL — ABNORMAL HIGH (ref 70–99)

## 2018-12-06 LAB — POTASSIUM: Potassium: 3.1 mmol/L — ABNORMAL LOW (ref 3.5–5.1)

## 2018-12-06 LAB — PHOSPHORUS: Phosphorus: 5.9 mg/dL — ABNORMAL HIGH (ref 2.5–4.6)

## 2018-12-06 MED ORDER — POTASSIUM CHLORIDE CRYS ER 20 MEQ PO TBCR
30.0000 meq | EXTENDED_RELEASE_TABLET | Freq: Once | ORAL | Status: AC
  Administered 2018-12-06: 30 meq via ORAL
  Filled 2018-12-06: qty 1

## 2018-12-06 MED ORDER — POTASSIUM CHLORIDE 10 MEQ/100ML IV SOLN
10.0000 meq | INTRAVENOUS | Status: AC
  Administered 2018-12-06 (×2): 10 meq via INTRAVENOUS
  Filled 2018-12-06: qty 100

## 2018-12-06 NOTE — Progress Notes (Signed)
Pt not tolerating IV potasium, rate decreased in half, new IV with blood return placed. Rate was again decreased, pt tolerating at only 68ml/hr.

## 2018-12-06 NOTE — Progress Notes (Signed)
[facility] NEPHROLOGY PROGRESS NOTE  Assessment/ Plan: Pt is a 68 y.o. yo male with history of CKD stage V follows with Dr. Posey Pronto at [facility], hypertension, DM, CHF admitted with shortness of breath and fluid overload.  Treated with high-dose IV diuretics and metolazone.   #CKD stage V thought to be due to diabetic nephropathy: Received IV Lasix and metolazone with significant improvement in lower extremity edema.  He is dry on exam.  Lasix has been on hold since 7/6 afternoon.   -He has good urine output, serum creatinine level stable at 5.8, BUN 100 is stable.  He does not have any uremic symptoms except generalized weakness which can be explained by hypokalemia.  No indication for initiating dialysis.   -He looks dry on exam, continue to hold diuretics, can be little liberal with fluid intake today.  Monitor BMP in the morning.  If his renal function stable or improved by tomorrow then he can probably go home and outpatient follow-up with Dr. Posey Pronto at [facility]. -He has left upper extremity AV fistula maturing well.    #Hypokalemia: Due to diuretics.  Magnesium level acceptable.  Order IV potassium chloride.  Monitor lab.  #Acute on chronic systolic CHF: EF 75%.  Initially treated with Lasix 160 mg IV every 6 hours and metolazone 10 mg daily.  He is euvolemic.  Seen by cardiology.  On Coreg.  He will need diuretics on discharge.  # Anemia due to CKD: Hemoglobin at goal.  # Secondary hyperparathyroidism: Check phosphorus level.  Recommend low phosphorus diet.  On calcitriol.  # HTN/volume; euvolemic.  Continue current medication.  Holding diuretics as discussed above.  Subjective:  Seen and examined at bedside.  Reports doing better.  Working with PT.  Denied headache, dizziness, nausea, vomiting, chest pain, shortness of breath.  Objective Vital signs in last 24 hours: Vitals:   12/05/18 2117 12/06/18 0500 12/06/18 0517 12/06/18 0725  BP:   126/62 138/73  Pulse:   72 72  Resp:    14 16  Temp:   98.7 F (37.1 C) 98.3 F (36.8 C)  TempSrc:   Oral Oral  SpO2: 95%  97% 98%  Weight:  94.7 kg    Height:       Weight change:   Intake/Output Summary (Last 24 hours) at 12/06/2018 0919 Last data filed at 12/06/2018 0729 Gross per 24 hour  Intake 480 ml  Output 1850 ml  Net -1370 ml       Labs: Basic Metabolic Panel: Recent Labs  Lab 11/30/18 0741  12/04/18 0549 12/05/18 0444 12/06/18 0513  NA 137   < > 136 137 134*  K 3.7   < > 4.7 3.4* 2.8*  CL 99   < > 99 94* 94*  CO2 22   < > 22 26 26   GLUCOSE 93   < > 106* 104* 90  BUN 55*   < > 92* 100* 100*  CREATININE 5.05*   < > 5.77* 5.99* 5.88*  CALCIUM 9.0  9.1   < > 9.7 9.2 9.0  PHOS 4.6  --   --   --  5.9*   < > = values in this interval not displayed.   Liver Function Tests: Recent Labs  Lab 11/30/18 0741 12/01/18 1330  AST  --  15  ALT  --  15  ALKPHOS  --  42  BILITOT  --  0.8  PROT  --  8.1  ALBUMIN 3.9 4.3   No  results for input(s): LIPASE, AMYLASE in the last 168 hours. No results for input(s): AMMONIA in the last 168 hours. CBC: Recent Labs  Lab 11/30/18 0741 11/30/18 0749 12/01/18 1330 12/05/18 0444  WBC 9.1  --  10.0 13.9*  NEUTROABS 7.4  --  9.3*  --   HGB 11.2* 11.0* 12.0* 13.3  HCT 35.5*  --  38.4* 41.0  MCV 90.3  --  91.2 88.9  PLT 226  --  260 286   Cardiac Enzymes: No results for input(s): CKTOTAL, CKMB, CKMBINDEX, TROPONINI in the last 168 hours. CBG: Recent Labs  Lab 12/05/18 0750 12/05/18 1114 12/05/18 1623 12/05/18 2052 12/06/18 0717  GLUCAP 121* 123* 136* 136* 88    Iron Studies: No results for input(s): IRON, TIBC, TRANSFERRIN, FERRITIN in the last 72 hours. Studies/Results: No results found.  Medications: Infusions: . sodium chloride    . potassium chloride      Scheduled Medications: . atorvastatin  20 mg Oral QHS  . calcitRIOL  0.25 mcg Oral Daily  . carvedilol  12.5 mg Oral BID WC  . doxazosin  4 mg Oral QHS  . heparin  5,000 Units  Subcutaneous Q8H  . hydrALAZINE  25 mg Oral BID  . insulin aspart  0-15 Units Subcutaneous TID WC  . insulin aspart  0-5 Units Subcutaneous QHS  . insulin glargine  20 Units Subcutaneous QHS  . isosorbide mononitrate  15 mg Oral Daily  . polyethylene glycol  17 g Oral Daily  . sodium chloride flush  3 mL Intravenous Q12H  . temazepam  15 mg Oral QHS    have reviewed scheduled and prn medications.  Physical Exam: General:NAD, comfortable, dry mucous membrane Heart:RRR, s1s2 nl, no rubs Lungs: Clear b/l, no crackle Abdomen:soft, Non-tender, non-distended Extremities: No LE edema Dialysis Access: Left upper extremity AV fistula, good thrill and bruit. Skin: No rash or ulcer.  Dron Prasad Bhandari 12/06/2018,9:19 AM  LOS: 5 days  Pager: [phone]

## 2018-12-06 NOTE — Progress Notes (Signed)
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 Patient seen due to shortness of breath this morning. His shortness of breath has become increasingly worse over as the day progressed. He called EMS who found him strongly hypoxic with oxygen sats in the mid 80s. He was placed on nonrebreather brought to the hospital. His shortness of breath was worse with exertion and improved with rest. He does have end-stage renal disease and had a fistula placed approximately 2 years ago. He has not started dialysis yet, but is on the transplant list at [facility].  Patient was admitted with acute hypercapnic and hypoxemic respiratory failure secondary to acute on chronic systolic congestive heart failure in the setting of end-stage renal disease. He has been started on aggressive IV Lasix dosing with some diuresis noted thus far.He is steadily improving on 7/5 and will be transferred to telemetry today. He has been weaned off nitroglycerin drip and has good urine output noted.On 7/6, further diuresis has been held by nephrology at this time. They are still evaluating need for hemodialysis while inpatient. Cardiology has evaluated the patient and recommends initiation of Imdur as well as Coreg.   PT recommends home with home health on discharge.  Nephrology recommending potassium supplementation and following for another day to ensure that levels have stabilized prior to discharge.  No need for hemodialysis initiation noted at this time.  Cardiology has signed off.  Assessment & Plan:   Active Problems:   Diabetes mellitus (HCC)   HTN (hypertension)   CKD (chronic kidney disease) stage 5, GFR less than 15 ml/min (HCC)  Acute respiratory failure with hypoxia and hypercapnia (HCC)   Acute systolic CHF (congestive heart failure) (HCC)   Acute combined respiratory failure secondary to acute on chronic systolic congestive heart failure exacerbation with volume overload -Diuresis held by nephrology today; will reassess tomorrow -2D echocardiogramwith LVEF 25-30% noted and moderate LVH. Diffuse hypokinesis.Appreciate cardiology recommendations as noted below. -[id number] -Strict I's and O's and daily weightsongoing, -2530 fluid output noted overnight -Repeat BMP in a.m.  Hypokalemia -Oral and IV repletion ordered -Check labs along with magnesium in a.m.  ESRD -Nephrology following and will initiate dialysis as needed -Follow labswith IV diuresis held for now.  Hypertension -Still elevated, but improved control noted -Patient off nitroglycerin -Imdur and Coreg added by cardiology, 7/7 Coreg dose increased to 12.5 mg twice daily  Diabetes -Blood glucose readings currently stable -Continue SSI and long-acting insulin -Carb modified diet  Constipation -Patient noted to have a bowel movement after enema yesterday   DVT prophylaxis:Heparin Code Status:Full Family Communication:None at bedside Disposition Plan:Appreciate nephrologyand cardiology recommendations with further IV Lasix being held at this time and close monitoring of symptomatology.     Nephrology replacing potassium and will recheck labs in a.m.  If patient is stable can discharge to home with home health in a.m.  Patient requires inpatient stay due to IV potassium supplementation and severe hypokalemia that requires recheck in a.m.   Consultants:  Nephrology  Cardiology  Procedures:  None  Antimicrobials:   None  Subjective: Patient seen and evaluated today with no new acute complaints or concerns. No acute concerns or events noted overnight.  He continues to have some ongoing weakness and is noted  to  be hypokalemic this morning.  He maintains good urine output.  He denies any significant nausea or vomiting and has been tolerating his diet.  Objective: Vitals:   12/05/18 2117 12/06/18 0500 12/06/18 0517 12/06/18 0725  BP:   126/62 138/73  Pulse:   72 72  Resp:   14 16  Temp:   98.7 F (37.1 C) 98.3 F (36.8 C)  TempSrc:   Oral Oral  SpO2: 95%  97% 98%  Weight:  94.7 kg    Height:        Intake/Output Summary (Last 24 hours) at 12/06/2018 1108 Last data filed at 12/06/2018 0729 Gross per 24 hour  Intake 480 ml  Output 1850 ml  Net -1370 ml   Filed Weights   12/03/18 0500 12/04/18 0602 12/06/18 0500  Weight: 99.1 kg 97.5 kg 94.7 kg    Examination:  General exam: Appears calm and comfortable  Respiratory system: Clear to auscultation. Respiratory effort normal. Cardiovascular system: S1 & S2 heard, RRR. No JVD, murmurs, rubs, gallops or clicks. No pedal edema. Gastrointestinal system: Abdomen is nondistended, soft and nontender. No organomegaly or masses felt. Normal bowel sounds heard. Central nervous system: Alert and oriented. No focal neurological deficits. Extremities: Symmetric 5 x 5 power. Skin: No rashes, lesions or ulcers Psychiatry: Judgement and insight appear normal. Mood & affect appropriate.     Data Reviewed: I have personally reviewed following labs and imaging studies  CBC: Recent Labs  Lab 11/30/18 0741 11/30/18 0749 12/01/18 1330 12/05/18 0444  WBC 9.1  --  10.0 13.9*  NEUTROABS 7.4  --  9.3*  --   HGB 11.2* 11.0* 12.0* 13.3  HCT 35.5*  --  38.4* 41.0  MCV 90.3  --  91.2 88.9  PLT 226  --  260 245   Basic Metabolic Panel: Recent Labs  Lab 11/30/18 0741  12/02/18 0426 12/03/18 0501 12/04/18 0549 12/05/18 0444 12/06/18 0513 12/06/18 0900  NA 137   < > 140 140 136 137 134*  --   K 3.7   < > 4.4 5.0 4.7 3.4* 2.8*  --   CL 99   < > 104 100 99 94* 94*  --   CO2 22   < > 22 23 22 26 26   --   GLUCOSE 93   < > 152* 159* 106* 104* 90  --    BUN 55*   < > 63* 75* 92* 100* 100*  --   CREATININE 5.05*   < > 5.04* 5.60* 5.77* 5.99* 5.88*  --   CALCIUM 9.0  9.1   < > 9.3 9.8 9.7 9.2 9.0  --   MG 2.1  --   --   --   --   --   --  2.3  PHOS 4.6  --   --   --   --   --  5.9*  --    < > = values in this interval not displayed.   GFR: Estimated Creatinine Clearance: 13.4 mL/min (A) (by C-G formula based on SCr of 5.88 mg/dL (H)). Liver Function Tests: Recent Labs  Lab 11/30/18 0741 12/01/18 1330  AST  --  15  ALT  --  15  ALKPHOS  --  42  BILITOT  --  0.8  PROT  --  8.1  ALBUMIN 3.9 4.3   No results for input(s): LIPASE, AMYLASE in the last 168 hours. No results for input(s): AMMONIA in the last 168 hours. Coagulation Profile: No  results for input(s): INR, PROTIME in the last 168 hours. Cardiac Enzymes: No results for input(s): CKTOTAL, CKMB, CKMBINDEX, TROPONINI in the last 168 hours. BNP (last 3 results) No results for input(s): PROBNP in the last 8760 hours. HbA1C: No results for input(s): HGBA1C in the last 72 hours. CBG: Recent Labs  Lab 12/05/18 0750 12/05/18 1114 12/05/18 1623 12/05/18 2052 12/06/18 0717  GLUCAP 121* 123* 136* 136* 88   Lipid Profile: No results for input(s): CHOL, HDL, LDLCALC, TRIG, CHOLHDL, LDLDIRECT in the last 72 hours. Thyroid Function Tests: No results for input(s): TSH, T4TOTAL, FREET4, T3FREE, THYROIDAB in the last 72 hours. Anemia Panel: No results for input(s): VITAMINB12, FOLATE, FERRITIN, TIBC, IRON, RETICCTPCT in the last 72 hours. Sepsis Labs: No results for input(s): PROCALCITON, LATICACIDVEN in the last 168 hours.  Recent Results (from the past 240 hour(s))  SARS Coronavirus 2 (CEPHEID - Performed in [place] hospital lab), Hosp Order     Status: None   Collection Time: 12/01/18  1:27 PM   Specimen: Nasopharyngeal Swab  Result Value Ref Range Status   SARS Coronavirus 2 NEGATIVE NEGATIVE Final    Comment: (NOTE) If result is NEGATIVE SARS-CoV-2 target  nucleic acids are NOT DETECTED. The SARS-CoV-2 RNA is generally detectable in upper and lower  respiratory specimens during the acute phase of infection. The lowest  concentration of SARS-CoV-2 viral copies this assay can detect is 250  copies / mL. A negative result does not preclude SARS-CoV-2 infection  and should not be used as the sole basis for treatment or other  patient management decisions.  A negative result may occur with  improper specimen collection / handling, submission of specimen other  than nasopharyngeal swab, presence of viral mutation(s) within the  areas targeted by this assay, and inadequate number of viral copies  (<250 copies / mL). A negative result must be combined with clinical  observations, patient history, and epidemiological information. If result is POSITIVE SARS-CoV-2 target nucleic acids are DETECTED. The SARS-CoV-2 RNA is generally detectable in upper and lower  respiratory specimens dur ing the acute phase of infection.  Positive  results are indicative of active infection with SARS-CoV-2.  Clinical  correlation with patient history and other diagnostic information is  necessary to determine patient infection status.  Positive results do  not rule out bacterial infection or co-infection with other viruses. If result is PRESUMPTIVE POSTIVE SARS-CoV-2 nucleic acids MAY BE PRESENT.   A presumptive positive result was obtained on the submitted specimen  and confirmed on repeat testing.  While 2019 novel coronavirus  (SARS-CoV-2) nucleic acids may be present in the submitted sample  additional confirmatory testing may be necessary for epidemiological  and / or clinical management purposes  to differentiate between  SARS-CoV-2 and other Sarbecovirus currently known to infect humans.  If clinically indicated additional testing with an alternate test  methodology [phone]) is advised. The SARS-CoV-2 RNA is generally  detectable in upper and lower  respiratory sp ecimens during the acute  phase of infection. The expected result is Negative. Fact Sheet for Patients:  StrictlyIdeas.no Fact Sheet for Healthcare Providers: BankingDealers.co.za This test is not yet approved or cleared by the [place] FDA and has been authorized for detection and/or diagnosis of SARS-CoV-2 by FDA under an Emergency Use Authorization (EUA).  This EUA will remain in effect (meaning this test can be used) for the duration of the COVID-19 declaration under Section 564(b)(1) of the Act, 21 U.S.C. section 360bbb-3(b)(1), unless the authorization  is terminated or revoked sooner. Performed at [facility], [address] [place], [place] [address]   MRSA PCR Screening     Status: None   Collection Time: 12/01/18  5:22 PM   Specimen: Nasal Mucosa; Nasopharyngeal  Result Value Ref Range Status   MRSA by PCR NEGATIVE NEGATIVE Final    Comment:        The GeneXpert MRSA Assay (FDA approved for NASAL specimens only), is one component of a comprehensive MRSA colonization surveillance program. It is not intended to diagnose MRSA infection nor to guide or monitor treatment for MRSA infections. Performed at [facility], [address], [place],  [address]          Radiology Studies: No results found.      Scheduled Meds: . atorvastatin  20 mg Oral QHS  . calcitRIOL  0.25 mcg Oral Daily  . carvedilol  12.5 mg Oral BID WC  . doxazosin  4 mg Oral QHS  . heparin  5,000 Units Subcutaneous Q8H  . hydrALAZINE  25 mg Oral BID  . insulin aspart  0-15 Units Subcutaneous TID WC  . insulin aspart  0-5 Units Subcutaneous QHS  . insulin glargine  20 Units Subcutaneous QHS  . isosorbide mononitrate  15 mg Oral Daily  . polyethylene glycol  17 g Oral Daily  . sodium chloride flush  3 mL Intravenous Q12H  . temazepam  15 mg Oral QHS   Continuous Infusions: . sodium chloride    . potassium chloride        LOS: 5 days    Time spent: 30 minutes    Faheem Ziemann Darleen Crocker, DO [facility] Pager [phone]  If 7PM-7AM, please contact night-coverage [email or web address] Password [email or web address] 12/06/2018, 11:08 AM

## 2018-12-06 NOTE — Progress Notes (Signed)
[facility] will sign off.   Medication Recommendations:  Continue current regimen. May titrate coreg and hydralazine as needed for blood pressure Other recommendations (labs, testing, etc):  n/a Follow up as an outpatient:  We will arrange outpatient f/u 2-3 weeks    Carlyle Dolly MD

## 2018-12-06 NOTE — Care Management Important Message (Signed)
Important Message  Patient Details  Name: Steven Perez MRN: [id number] Date of Birth: 02/24/1951   Medicare Important Message Given:  Yes     [name] 12/06/2018, 2:22 PM

## 2018-12-07 LAB — RENAL FUNCTION PANEL
Albumin: 3.9 g/dL (ref 3.5–5.0)
Anion gap: 20 — ABNORMAL HIGH (ref 5–15)
BUN: 100 mg/dL — ABNORMAL HIGH (ref 8–23)
CO2: 25 mmol/L (ref 22–32)
Calcium: 9.1 mg/dL (ref 8.9–10.3)
Chloride: 89 mmol/L — ABNORMAL LOW (ref 98–111)
Creatinine, Ser: 5.6 mg/dL — ABNORMAL HIGH (ref 0.61–1.24)
GFR calc Af Amer: 11 mL/min — ABNORMAL LOW (ref >60)
GFR calc non Af Amer: 10 mL/min — ABNORMAL LOW (ref >60)
Glucose, Bld: 94 mg/dL (ref 70–99)
Phosphorus: 5.8 mg/dL — ABNORMAL HIGH (ref 2.5–4.6)
Potassium: 3.1 mmol/L — ABNORMAL LOW (ref 3.5–5.1)
Sodium: 134 mmol/L — ABNORMAL LOW (ref 135–145)

## 2018-12-07 LAB — MAGNESIUM: Magnesium: 2.4 mg/dL (ref 1.7–2.4)

## 2018-12-07 LAB — GLUCOSE, CAPILLARY: Glucose-Capillary: 143 mg/dL — ABNORMAL HIGH (ref 70–99)

## 2018-12-07 LAB — HEPATITIS B SURFACE ANTIGEN: Hepatitis B Surface Ag: NEGATIVE

## 2018-12-07 MED ORDER — ISOSORBIDE MONONITRATE ER 30 MG PO TB24: 15.0000 mg | ORAL_TABLET | Freq: Every day | ORAL | 3 refills | Status: DC

## 2018-12-07 MED ORDER — FUROSEMIDE 80 MG PO TABS: 80.0000 mg | ORAL_TABLET | Freq: Every day | ORAL | Status: DC

## 2018-12-07 MED ORDER — POTASSIUM CHLORIDE CRYS ER 20 MEQ PO TBCR
40.0000 meq | EXTENDED_RELEASE_TABLET | Freq: Once | ORAL | Status: AC
  Administered 2018-12-07: 40 meq via ORAL
  Filled 2018-12-07: qty 2

## 2018-12-07 MED ORDER — POLYETHYLENE GLYCOL 3350 17 G PO PACK: 17.0000 g | PACK | Freq: Every day | ORAL | 0 refills | Status: DC

## 2018-12-07 MED ORDER — POTASSIUM CHLORIDE CRYS ER 20 MEQ PO TBCR: 20.0000 meq | EXTENDED_RELEASE_TABLET | Freq: Every day | ORAL | Status: DC

## 2018-12-07 MED ORDER — CALCIUM ACETATE (PHOS BINDER) 667 MG PO CAPS: 667.0000 mg | ORAL_CAPSULE | Freq: Three times a day (TID) | ORAL | 2 refills | Status: DC

## 2018-12-07 MED ORDER — POTASSIUM CHLORIDE CRYS ER 20 MEQ PO TBCR: 20.0000 meq | EXTENDED_RELEASE_TABLET | Freq: Every day | ORAL | 2 refills | Status: AC

## 2018-12-07 MED ORDER — FUROSEMIDE 40 MG PO TABS: 80.0000 mg | ORAL_TABLET | Freq: Every day | ORAL | 3 refills | Status: DC

## 2018-12-07 MED ORDER — CALCIUM ACETATE (PHOS BINDER) 667 MG PO CAPS: 667.0000 mg | ORAL_CAPSULE | Freq: Three times a day (TID) | ORAL | Status: DC

## 2018-12-07 MED ORDER — CARVEDILOL 12.5 MG PO TABS: 12.5000 mg | ORAL_TABLET | Freq: Two times a day (BID) | ORAL | 3 refills | Status: DC

## 2018-12-07 NOTE — Discharge Summary (Signed)
Physician Discharge Summary  Steven Perez TMH:[id number] DOB: 07-28-1950 DOA: 12/01/2018  PCP: Redmond School, MD  Admit date: 12/01/2018  Discharge date: 12/07/2018  Admitted From:Home  Disposition:  Home  Recommendations for Outpatient Follow-up:  1. Follow up with PCP in 1-2 weeks 2. Follow-up with Dr. Posey Pronto with nephrology on 7/22 at 8:15 AM 3. Repeat BMP at [facility] on 7/15 at 8:30 AM 4. Follow-up with cardiology Dr. Harl Bowie on 7/29 at 2 PM 5. Continue Lasix 80 mg daily along with potassium 20 mEq daily starting 7/10 6. 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 Patient seen due to shortness of breath this morning. His shortness of breath has become increasingly worse over as the day progressed. He called EMS who found him strongly hypoxic with oxygen sats in the mid 80s. He was placed on nonrebreather brought to the hospital. His shortness of breath was worse with exertion and improved with rest. He does have end-stage renal disease and had a fistula placed approximately 2 years ago. He has not started dialysis yet, but is on the transplant list at [facility].  Patient was admitted with acute hypercapnic and hypoxemic respiratory failure secondary to acute on chronic systolic congestive heart failure in the setting of end-stage renal disease. He has been started on aggressive IV Lasix dosing with some diuresis noted thus far.He is steadily improving on 7/5 and will be transferred to telemetry today. He has been weaned off nitroglycerin drip and has good  urine output noted.On 7/6, further diuresis has been held by nephrology at this time. They are still evaluating need for hemodialysis while inpatient. Cardiology has evaluated the patient and recommends initiation of Imdur as well as Coreg.PT recommends home with home health on discharge.   Nephrology has evaluated patient on day of discharge and it appears the patient has not required initiation of hemodialysis over the last 48 to 72 hours.  Patient has much less symptomatic complaints or concerns and does not appear to be uremic and is making good urine output.  He will remain on Lasix 80 mg daily as well as potassium as prescribed with close follow-up to nephrology on 7/22 with Dr. Posey Pronto.  Additionally, patient will have repeat lab work on 7/15.  He will also follow-up with cardiology as noted above and remain on Coreg as well as Imdur.  No other acute events noted during the course of this admission.  He is otherwise stable for discharge.  Discharge Diagnoses:  Active Problems:   Diabetes mellitus (HCC)   HTN (hypertension)   CKD (chronic kidney disease) stage 5, GFR less than 15 ml/min (HCC)   Acute respiratory failure with hypoxia and hypercapnia (HCC)   Acute systolic CHF (congestive heart failure) ([place])  Principal discharge diagnosis: Acute combined respiratory failure secondary to acute on chronic systolic congestive heart failure exacerbation in the setting of CKD stage V.  Discharge Instructions  Discharge Instructions    Diet - low sodium heart healthy   Complete by: As directed    Increase activity slowly   Complete by: As directed      Allergies as of 12/07/2018  Reactions   Contrast Media [iodinated Diagnostic Agents] Other (See Comments)   Renal failure after administration of CT contrast     Tetanus Toxoids Swelling   "bad reaction;" told not to take this again      Medication List    TAKE these medications   calcitRIOL 0.25 MCG capsule Commonly known as:  ROCALTROL Take 0.25 mcg by mouth daily.   calcium acetate 667 MG capsule Commonly known as: PHOSLO Take 1 capsule (667 mg total) by mouth 3 (three) times daily with meals.   carvedilol 12.5 MG tablet Commonly known as: COREG Take 1 tablet (12.5 mg total) by mouth 2 (two) times daily with a meal. What changed:   medication strength  how much to take  when to take this   doxazosin 4 MG tablet Commonly known as: CARDURA Take 4 mg by mouth at bedtime.   epoetin alfa 10000 UNIT/ML injection Commonly known as: EPOGEN 10,000 Units every 30 (thirty) days.   epoetin alfa 20000 UNIT/ML injection Commonly known as: EPOGEN 20,000 Units every 30 (thirty) days.   furosemide 40 MG tablet Commonly known as: LASIX Take 2 tablets (80 mg total) by mouth daily. Start taking on: December 08, 2018   hydrALAZINE 25 MG tablet Commonly known as: APRESOLINE Take 25 mg by mouth 2 (two) times daily.   isosorbide mononitrate 30 MG 24 hr tablet Commonly known as: IMDUR Take 0.5 tablets (15 mg total) by mouth daily.   polyethylene glycol 17 g packet Commonly known as: MIRALAX / GLYCOLAX Take 17 g by mouth daily.   potassium chloride SA 20 MEQ tablet Commonly known as: K-DUR Take 1 tablet (20 mEq total) by mouth daily. Start taking on: December 08, 2018   simvastatin 40 MG tablet Commonly known as: ZOCOR Take 40 mg by mouth at bedtime.   traZODone 100 MG tablet Commonly known as: DESYREL Take 100 mg by mouth at bedtime.   Tyler Aas FlexTouch 200 UNIT/ML Sopn Generic drug: Insulin Degludec Inject 20 Units into the skin at bedtime.   verapamil 180 MG CR tablet Commonly known as: CALAN-SR Take 2 tablets (360 mg total) by mouth at bedtime.   Vitamin D (Ergocalciferol) 1.25 MG (50000 UT) Caps capsule Commonly known as: DRISDOL Take 50,000 Units by mouth every Wednesday.      Follow-up Information    Erma Heritage, PA-C On 12/27/2018.   Specialties: Physician Assistant,  Cardiology Why: at 2:00 pm Contact information: [place] [address] [phone]        Redmond School, MD Follow up in 1 week(s).   Specialty: Internal Medicine Contact information: [address] [place] [address] [phone]        Arnoldo Lenis, MD .   Specialty: Cardiology Contact information: [address] [place] [address] [phone]        Elmarie Shiley, MD Follow up on 12/20/2018.   Specialty: Nephrology Contact information: [place] [address] [phone]          Allergies  Allergen Reactions  . Contrast Media [Iodinated Diagnostic Agents] Other (See Comments)    Renal failure after administration of CT contrast    . Tetanus Toxoids Swelling    "bad reaction;" told not to take this again    Consultations:  Nephrology  Cardiology   Procedures/Studies: Dg Chest Portable 1 View  Result Date: 12/01/2018 CLINICAL DATA:  Shortness of breath. EXAM: PORTABLE CHEST 1 VIEW COMPARISON:  Radiograph of November 26, 2013.  FINDINGS: Mild cardiomegaly is noted. Interval development of interstitial densities are noted throughout both lungs concerning for pulmonary edema. Small pleural effusions may be present. No pneumothorax is noted. Bony thorax is unremarkable. IMPRESSION: Cardiomegaly is noted with bilateral pulmonary edema and small pleural effusions. Electronically Signed   By: Marijo Conception M.D.   On: 12/01/2018 13:50     Discharge Exam: Vitals:   12/06/18 2102 12/07/18 0534  BP: 134/79 131/73  Pulse: 71 65  Resp: 16 16  Temp: (!) 97.5 F (36.4 C) 98.4 F (36.9 C)  SpO2: 97% 100%   Vitals:   12/06/18 2005 12/06/18 2102 12/07/18 0500 12/07/18 0534  BP:  134/79  131/73  Pulse:  71  65  Resp:  16  16  Temp:  (!) 97.5 F (36.4 C)  98.4 F (36.9 C)  TempSrc:  Oral  Oral  SpO2: 94% 97%  100%  Weight:   93.3 kg   Height:        General: Pt is alert, awake, not in acute  distress Cardiovascular: RRR, S1/S2 +, no rubs, no gallops Respiratory: CTA bilaterally, no wheezing, no rhonchi Abdominal: Soft, NT, ND, bowel sounds + Extremities: no edema, no cyanosis    The results of significant diagnostics from this hospitalization (including imaging, microbiology, ancillary and laboratory) are listed below for reference.     Microbiology: Recent Results (from the past 240 hour(s))  SARS Coronavirus 2 (CEPHEID - Performed in [place] hospital lab), Hosp Order     Status: None   Collection Time: 12/01/18  1:27 PM   Specimen: Nasopharyngeal Swab  Result Value Ref [place] Status   SARS Coronavirus 2 NEGATIVE NEGATIVE Final    Comment: (NOTE) If result is NEGATIVE SARS-CoV-2 target nucleic acids are NOT DETECTED. The SARS-CoV-2 RNA is generally detectable in upper and lower  respiratory specimens during the acute phase of infection. The lowest  concentration of SARS-CoV-2 viral copies this assay can detect is 250  copies / mL. A negative result does not preclude SARS-CoV-2 infection  and should not be used as the sole basis for treatment or other  patient management decisions.  A negative result may occur with  improper specimen collection / handling, submission of specimen other  than nasopharyngeal swab, presence of viral mutation(s) within the  areas targeted by this assay, and inadequate number of viral copies  (<250 copies / mL). A negative result must be combined with clinical  observations, patient history, and epidemiological information. If result is POSITIVE SARS-CoV-2 target nucleic acids are DETECTED. The SARS-CoV-2 RNA is generally detectable in upper and lower  respiratory specimens dur ing the acute phase of infection.  Positive  results are indicative of active infection with SARS-CoV-2.  Clinical  correlation with patient history and other diagnostic information is  necessary to determine patient infection status.  Positive results do  not  rule out bacterial infection or co-infection with other viruses. If result is PRESUMPTIVE POSTIVE SARS-CoV-2 nucleic acids MAY BE PRESENT.   A presumptive positive result was obtained on the submitted specimen  and confirmed on repeat testing.  While 2019 novel coronavirus  (SARS-CoV-2) nucleic acids may be present in the submitted sample  additional confirmatory testing may be necessary for epidemiological  and / or clinical management purposes  to differentiate between  SARS-CoV-2 and other Sarbecovirus currently known to infect humans.  If clinically indicated additional testing with an alternate test  methodology [phone]) is advised. The SARS-CoV-2 RNA is generally  detectable in upper and lower respiratory sp ecimens during the acute  phase of infection. The expected result is Negative. Fact Sheet for Patients:  StrictlyIdeas.no Fact Sheet for Healthcare Providers: BankingDealers.co.za This test is not yet approved or cleared by the [place] FDA and has been authorized for detection and/or diagnosis of SARS-CoV-2 by FDA under an Emergency Use Authorization (EUA).  This EUA will remain in effect (meaning this test can be used) for the duration of the COVID-19 declaration under Section 564(b)(1) of the Act, 21 U.S.C. section 360bbb-3(b)(1), unless the authorization is terminated or revoked sooner. Performed at [facility], [address] [place], [place] [address]   MRSA PCR Screening     Status: None   Collection Time: 12/01/18  5:22 PM   Specimen: Nasal Mucosa; Nasopharyngeal  Result Value Ref [place] Status   MRSA by PCR NEGATIVE NEGATIVE Final    Comment:        The GeneXpert MRSA Assay (FDA approved for NASAL specimens only), is one component of a comprehensive MRSA colonization surveillance program. It is not intended to diagnose MRSA infection nor to guide or monitor treatment for MRSA infections. Performed at  [facility], [address] [place], [place] [address]      Labs: BNP (last 3 results) No results for input(s): BNP in the last 8760 hours. Basic Metabolic Panel: Recent Labs  Lab 12/03/18 0501 12/04/18 0549 12/05/18 0444 12/06/18 0513 12/06/18 0900 12/06/18 1319 12/07/18 0505  NA 140 136 137 134*  --   --  134*  K 5.0 4.7 3.4* 2.8*  --  3.1* 3.1*  CL 100 99 94* 94*  --   --  89*  CO2 23 22 26 26   --   --  25  GLUCOSE 159* 106* 104* 90  --   --  94  BUN 75* 92* 100* 100*  --   --  100*  CREATININE 5.60* 5.77* 5.99* 5.88*  --   --  5.60*  CALCIUM 9.8 9.7 9.2 9.0  --   --  9.1  MG  --   --   --   --  2.3  --  2.4  PHOS  --   --   --  5.9*  --   --  5.8*   Liver Function Tests: Recent Labs  Lab 12/01/18 1330 12/07/18 0505  AST 15  --   ALT 15  --   ALKPHOS 42  --   BILITOT 0.8  --   PROT 8.1  --   ALBUMIN 4.3 3.9   No results for input(s): LIPASE, AMYLASE in the last 168 hours. No results for input(s): AMMONIA in the last 168 hours. CBC: Recent Labs  Lab 12/01/18 1330 12/05/18 0444  WBC 10.0 13.9*  NEUTROABS 9.3*  --   HGB 12.0* 13.3  HCT 38.4* 41.0  MCV 91.2 88.9  PLT 260 286   Cardiac Enzymes: No results for input(s): CKTOTAL, CKMB, CKMBINDEX, TROPONINI in the last 168 hours. BNP: Invalid input(s): POCBNP CBG: Recent Labs  Lab 12/06/18 0717 12/06/18 1107 12/06/18 1606 12/06/18 2103 12/07/18 0720  GLUCAP 88 118* 96 116* 143*   D-Dimer No results for input(s): DDIMER in the last 72 hours. Hgb A1c No results for input(s): HGBA1C in the last 72 hours. Lipid Profile No results for input(s): CHOL, HDL, LDLCALC, TRIG, CHOLHDL, LDLDIRECT in the last 72 hours. Thyroid function studies No results for input(s): TSH, T4TOTAL, T3FREE, THYROIDAB in the last 72 hours.  Invalid  input(s): FREET3 Anemia work up No results for input(s): VITAMINB12, FOLATE, FERRITIN, TIBC, IRON, RETICCTPCT in the last 72 hours. Urinalysis    Component Value Date/Time    COLORURINE YELLOW 11/26/2013 1849   APPEARANCEUR CLEAR 11/26/2013 1849   LABSPEC 1.016 11/26/2013 1849   PHURINE 5.0 11/26/2013 1849   GLUCOSEU NEGATIVE 11/26/2013 1849   HGBUR NEGATIVE 11/26/2013 1849   BILIRUBINUR NEGATIVE 11/26/2013 1849   KETONESUR NEGATIVE 11/26/2013 1849   PROTEINUR NEGATIVE 11/26/2013 1849   UROBILINOGEN 0.2 11/26/2013 1849   NITRITE NEGATIVE 11/26/2013 1849   LEUKOCYTESUR TRACE (A) 11/26/2013 1849   Sepsis Labs Invalid input(s): PROCALCITONIN,  WBC,  LACTICIDVEN Microbiology Recent Results (from the past 240 hour(s))  SARS Coronavirus 2 (CEPHEID - Performed in [place] hospital lab), Hosp Order     Status: None   Collection Time: 12/01/18  1:27 PM   Specimen: Nasopharyngeal Swab  Result Value Ref [place] Status   SARS Coronavirus 2 NEGATIVE NEGATIVE Final    Comment: (NOTE) If result is NEGATIVE SARS-CoV-2 target nucleic acids are NOT DETECTED. The SARS-CoV-2 RNA is generally detectable in upper and lower  respiratory specimens during the acute phase of infection. The lowest  concentration of SARS-CoV-2 viral copies this assay can detect is 250  copies / mL. A negative result does not preclude SARS-CoV-2 infection  and should not be used as the sole basis for treatment or other  patient management decisions.  A negative result may occur with  improper specimen collection / handling, submission of specimen other  than nasopharyngeal swab, presence of viral mutation(s) within the  areas targeted by this assay, and inadequate number of viral copies  (<250 copies / mL). A negative result must be combined with clinical  observations, patient history, and epidemiological information. If result is POSITIVE SARS-CoV-2 target nucleic acids are DETECTED. The SARS-CoV-2 RNA is generally detectable in upper and lower  respiratory specimens dur ing the acute phase of infection.  Positive  results are indicative of active infection with SARS-CoV-2.  Clinical   correlation with patient history and other diagnostic information is  necessary to determine patient infection status.  Positive results do  not rule out bacterial infection or co-infection with other viruses. If result is PRESUMPTIVE POSTIVE SARS-CoV-2 nucleic acids MAY BE PRESENT.   A presumptive positive result was obtained on the submitted specimen  and confirmed on repeat testing.  While 2019 novel coronavirus  (SARS-CoV-2) nucleic acids may be present in the submitted sample  additional confirmatory testing may be necessary for epidemiological  and / or clinical management purposes  to differentiate between  SARS-CoV-2 and other Sarbecovirus currently known to infect humans.  If clinically indicated additional testing with an alternate test  methodology [phone]) is advised. The SARS-CoV-2 RNA is generally  detectable in upper and lower respiratory sp ecimens during the acute  phase of infection. The expected result is Negative. Fact Sheet for Patients:  StrictlyIdeas.no Fact Sheet for Healthcare Providers: BankingDealers.co.za This test is not yet approved or cleared by the [place] FDA and has been authorized for detection and/or diagnosis of SARS-CoV-2 by FDA under an Emergency Use Authorization (EUA).  This EUA will remain in effect (meaning this test can be used) for the duration of the COVID-19 declaration under Section 564(b)(1) of the Act, 21 U.S.C. section 360bbb-3(b)(1), unless the authorization is terminated or revoked sooner. Performed at [facility], [address] [place], [place] [address]   MRSA PCR Screening     Status: None  Collection Time: 12/01/18  5:22 PM   Specimen: Nasal Mucosa; Nasopharyngeal  Result Value Ref [place] Status   MRSA by PCR NEGATIVE NEGATIVE Final    Comment:        The GeneXpert MRSA Assay (FDA approved for NASAL specimens only), is one component of a comprehensive MRSA  colonization surveillance program. It is not intended to diagnose MRSA infection nor to guide or monitor treatment for MRSA infections. Performed at [facility], [address], [place], [place] [address]      Time coordinating discharge: 35 minutes  SIGNED:   Rodena Goldmann, DO [facility] 12/07/2018, 9:33 AM  If 7PM-7AM, please contact night-coverage [email or web address] Password [email or web address]

## 2018-12-07 NOTE — Plan of Care (Signed)
  Problem: Education: Goal: Knowledge of General Education information will improve Description Including pain rating scale, medication(s)/side effects and non-pharmacologic comfort measures Outcome: Progressing   Problem: Health Behavior/Discharge Planning: Goal: Ability to manage health-related needs will improve Outcome: Progressing   

## 2018-12-07 NOTE — Progress Notes (Signed)
Nsg Discharge Note  Admit Date:  12/01/2018 Discharge date: 12/07/2018   Joesph July to be D/C'd Home per MD order.  AVS completed.  Copy for chart, and copy for patient signed, and dated. Patient/caregiver able to verbalize understanding. Removed IV-clean, dry, intact. Discharge Medication: Allergies as of 12/07/2018      Reactions   Contrast Media [iodinated Diagnostic Agents] Other (See Comments)   Renal failure after administration of CT contrast     Tetanus Toxoids Swelling   "bad reaction;" told not to take this again      Medication List    TAKE these medications   calcitRIOL 0.25 MCG capsule Commonly known as: ROCALTROL Take 0.25 mcg by mouth daily.   calcium acetate 667 MG capsule Commonly known as: PHOSLO Take 1 capsule (667 mg total) by mouth 3 (three) times daily with meals.   carvedilol 12.5 MG tablet Commonly known as: COREG Take 1 tablet (12.5 mg total) by mouth 2 (two) times daily with a meal. What changed:   medication strength  how much to take  when to take this   doxazosin 4 MG tablet Commonly known as: CARDURA Take 4 mg by mouth at bedtime.   epoetin alfa 10000 UNIT/ML injection Commonly known as: EPOGEN 10,000 Units every 30 (thirty) days.   epoetin alfa 20000 UNIT/ML injection Commonly known as: EPOGEN 20,000 Units every 30 (thirty) days.   furosemide 40 MG tablet Commonly known as: LASIX Take 2 tablets (80 mg total) by mouth daily. Start taking on: December 08, 2018   hydrALAZINE 25 MG tablet Commonly known as: APRESOLINE Take 25 mg by mouth 2 (two) times daily.   isosorbide mononitrate 30 MG 24 hr tablet Commonly known as: IMDUR Take 0.5 tablets (15 mg total) by mouth daily.   polyethylene glycol 17 g packet Commonly known as: MIRALAX / GLYCOLAX Take 17 g by mouth daily.   potassium chloride SA 20 MEQ tablet Commonly known as: K-DUR Take 1 tablet (20 mEq total) by mouth daily. Start taking on: December 08, 2018   simvastatin 40 MG  tablet Commonly known as: ZOCOR Take 40 mg by mouth at bedtime.   traZODone 100 MG tablet Commonly known as: DESYREL Take 100 mg by mouth at bedtime.   Tyler Aas FlexTouch 200 UNIT/ML Sopn Generic drug: Insulin Degludec Inject 20 Units into the skin at bedtime.   verapamil 180 MG CR tablet Commonly known as: CALAN-SR Take 2 tablets (360 mg total) by mouth at bedtime.   Vitamin D (Ergocalciferol) 1.25 MG (50000 UT) Caps capsule Commonly known as: DRISDOL Take 50,000 Units by mouth every Wednesday.       Discharge Assessment: Vitals:   12/06/18 2102 12/07/18 0534  BP: 134/79 131/73  Pulse: 71 65  Resp: 16 16  Temp: (!) 97.5 F (36.4 C) 98.4 F (36.9 C)  SpO2: 97% 100%   Skin clean, dry and intact without evidence of skin break down, no evidence of skin tears noted. IV catheter discontinued intact. Site without signs and symptoms of complications - no redness or edema noted at insertion site, patient denies c/o pain - only slight tenderness at site.  Dressing with slight pressure applied.  D/c Instructions-Education: Discharge instructions given to patient/family with verbalized understanding. D/c education completed with patient/family including follow up instructions, medication list, d/c activities limitations if indicated, with other d/c instructions as indicated by MD - patient able to verbalize understanding, all questions fully answered. Patient instructed to return to ED, call 911, or call  MD for any changes in condition.  Patient escorted via [place], and D/C home via private auto.  Santa Lighter, RN 12/07/2018 11:03 AM

## 2018-12-07 NOTE — Progress Notes (Addendum)
[facility] NEPHROLOGY PROGRESS NOTE  Assessment/ Plan: Pt is a 68 y.o. yo male with history of CKD stage V follows with Dr. Posey Pronto at [facility], hypertension, DM, CHF admitted with shortness of breath and fluid overload.  Treated with high-dose IV diuretics and metolazone.   #CKD stage V thought to be due to diabetic nephropathy: Received IV Lasix and metolazone with significant improvement in lower extremity edema.  He was on lasix 160 mg q6hr and metolazone 10 mg daily. The lasix has been on hold since 7/6 afternoon. He is euvolumic on exam. He in nonoliguric and has no uremic symptoms.  Now his renal function has stabilized with creatinine 5.6 and BUN 100.  He feels great except bothered by urinary catheter.  Plan to discontinue external urinary catheter today.  No absolute indication to start dialysis this time.  Patient is also hesitant about starting dialysis this time. -ResumeLasix 80 mg p.o. daily and potassium chloride 20 mEq from tomorrow. -I have arranged him to have lab done next week 7/15 Wednesday morning at 8:30 AM at [place] [facility].  He has follow-up with Dr. Posey Pronto on 12/20/2018 at 8:15 AM arrival time. -He has left upper extremity AV fistula maturing well.  Okay to discharge from renal perspective.  Patient understand that he needs to come to ER if he has any new symptoms including CP, SOB, N/V, leg swelling etc.  He has home care arranged. Advised salt and fluid restriction.  #Hypokalemia: Due to diuretics.  Magnesium level acceptable.  Replete potassium chloride.  #Acute on chronic systolic CHF: EF 06%. He is euvolemic currently.  Seen by cardiology.  On Coreg.  Diuretics as above.  # Anemia due to CKD: Hemoglobin at goal.  # Secondary hyperparathyroidism: Elevated phosphorus level.  Start PhosLo.  Continue calcitriol.  # HTN/volume; euvolemic.  Continue current medication.   Discussed with primary team.  Subjective:  Seen and examined at bedside.  He feels  good.  Denies headache, dizziness, nausea, vomiting, chest pain, shortness of breath, loss of appetite, dysgeusia.  Objective Vital signs in last 24 hours: Vitals:   12/06/18 2005 12/06/18 2102 12/07/18 0500 12/07/18 0534  BP:  134/79  131/73  Pulse:  71  65  Resp:  16  16  Temp:  (!) 97.5 F (36.4 C)  98.4 F (36.9 C)  TempSrc:  Oral  Oral  SpO2: 94% 97%  100%  Weight:   93.3 kg   Height:       Weight change: -1.4 kg  Intake/Output Summary (Last 24 hours) at 12/07/2018 0856 Last data filed at 12/07/2018 0300 Gross per 24 hour  Intake 720 ml  Output 600 ml  Net 120 ml       Labs: Basic Metabolic Panel: Recent Labs  Lab 12/05/18 0444 12/06/18 0513 12/06/18 1319 12/07/18 0505  NA 137 134*  --  134*  K 3.4* 2.8* 3.1* 3.1*  CL 94* 94*  --  89*  CO2 26 26  --  25  GLUCOSE 104* 90  --  94  BUN 100* 100*  --  100*  CREATININE 5.99* 5.88*  --  5.60*  CALCIUM 9.2 9.0  --  9.1  PHOS  --  5.9*  --  5.8*   Liver Function Tests: Recent Labs  Lab 12/01/18 1330 12/07/18 0505  AST 15  --   ALT 15  --   ALKPHOS 42  --   BILITOT 0.8  --   PROT 8.1  --  ALBUMIN 4.3 3.9   No results for input(s): LIPASE, AMYLASE in the last 168 hours. No results for input(s): AMMONIA in the last 168 hours. CBC: Recent Labs  Lab 12/01/18 1330 12/05/18 0444  WBC 10.0 13.9*  NEUTROABS 9.3*  --   HGB 12.0* 13.3  HCT 38.4* 41.0  MCV 91.2 88.9  PLT 260 286   Cardiac Enzymes: No results for input(s): CKTOTAL, CKMB, CKMBINDEX, TROPONINI in the last 168 hours. CBG: Recent Labs  Lab 12/06/18 0717 12/06/18 1107 12/06/18 1606 12/06/18 2103 12/07/18 0720  GLUCAP 88 118* 96 116* 143*    Iron Studies: No results for input(s): IRON, TIBC, TRANSFERRIN, FERRITIN in the last 72 hours. Studies/Results: No results found.  Medications: Infusions: . sodium chloride      Scheduled Medications: . atorvastatin  20 mg Oral QHS  . calcitRIOL  0.25 mcg Oral Daily  . carvedilol  12.5 mg  Oral BID WC  . doxazosin  4 mg Oral QHS  . heparin  5,000 Units Subcutaneous Q8H  . hydrALAZINE  25 mg Oral BID  . insulin aspart  0-15 Units Subcutaneous TID WC  . insulin aspart  0-5 Units Subcutaneous QHS  . insulin glargine  20 Units Subcutaneous QHS  . isosorbide mononitrate  15 mg Oral Daily  . polyethylene glycol  17 g Oral Daily  . potassium chloride  40 mEq Oral Once  . sodium chloride flush  3 mL Intravenous Q12H  . temazepam  15 mg Oral QHS    have reviewed scheduled and prn medications.  Physical Exam: General:NAD, comfortable,  Heart:RRR, s1s2 nl, no rubs Lungs: Clear bilateral, no crackle Abdomen:soft, Non-tender, non-distended Extremities: No lower extremity edema Dialysis Access: Left upper extremity AV fistula, good thrill and bruit. Skin: No rash or ulcer. Neurology: Alert, awake, no asterixis  Dron Tanna Furry 12/07/2018,8:56 AM  LOS: 6 days  Pager: [phone]

## 2018-12-07 NOTE — Plan of Care (Signed)
  Problem: Education: Goal: Knowledge of General Education information will improve Description: Including pain rating scale, medication(s)/side effects and non-pharmacologic comfort measures 12/07/2018 1009 by Santa Lighter, RN Outcome: Adequate for Discharge 12/07/2018 [place] by Santa Lighter, RN Outcome: Progressing   Problem: Health Behavior/Discharge Planning: Goal: Ability to manage health-related needs will improve 12/07/2018 1009 by Santa Lighter, RN Outcome: Adequate for Discharge 12/07/2018 0941 by Santa Lighter, RN Outcome: Progressing   Problem: Clinical Measurements: Goal: Ability to maintain clinical measurements within normal limits will improve Outcome: Adequate for Discharge Goal: Will remain free from infection Outcome: Adequate for Discharge Goal: Diagnostic test results will improve Outcome: Adequate for Discharge Goal: Respiratory complications will improve Outcome: Adequate for Discharge Goal: Cardiovascular complication will be avoided Outcome: Adequate for Discharge   Problem: Activity: Goal: Risk for activity intolerance will decrease Outcome: Adequate for Discharge   Problem: Nutrition: Goal: Adequate nutrition will be maintained Outcome: Adequate for Discharge   Problem: Coping: Goal: Level of anxiety will decrease Outcome: Adequate for Discharge   Problem: Pain Managment: Goal: General experience of comfort will improve Outcome: Adequate for Discharge   Problem: Safety: Goal: Ability to remain free from injury will improve Outcome: Adequate for Discharge   Problem: Skin Integrity: Goal: Risk for impaired skin integrity will decrease Outcome: Adequate for Discharge   Problem: Activity: Goal: Capacity to carry out activities will improve Outcome: Adequate for Discharge   Problem: Cardiac: Goal: Ability to achieve and maintain adequate cardiopulmonary perfusion will improve Outcome: Adequate for Discharge

## 2018-12-09 ENCOUNTER — Encounter (HOSPITAL_COMMUNITY): Payer: Self-pay | Admitting: Emergency Medicine

## 2018-12-09 ENCOUNTER — Emergency Department (HOSPITAL_COMMUNITY)
Admission: EM | Admit: 2018-12-09 | Discharge: 2018-12-09 | Disposition: A | Discharge status: Home / Self Care | Payer: Medicare Other | Attending: Emergency Medicine | Admitting: Emergency Medicine

## 2018-12-09 ENCOUNTER — Emergency Department (HOSPITAL_COMMUNITY): Payer: Medicare Other

## 2018-12-09 ENCOUNTER — Other Ambulatory Visit: Payer: Self-pay

## 2018-12-09 DIAGNOSIS — Y9301 Activity, walking, marching and hiking: Secondary | ICD-10-CM | POA: Insufficient documentation

## 2018-12-09 DIAGNOSIS — I5022 Chronic systolic (congestive) heart failure: Secondary | ICD-10-CM | POA: Insufficient documentation

## 2018-12-09 DIAGNOSIS — Y929 Unspecified place or not applicable: Secondary | ICD-10-CM | POA: Diagnosis not present

## 2018-12-09 DIAGNOSIS — Y999 Unspecified external cause status: Secondary | ICD-10-CM | POA: Insufficient documentation

## 2018-12-09 DIAGNOSIS — Z794 Long term (current) use of insulin: Secondary | ICD-10-CM | POA: Diagnosis not present

## 2018-12-09 DIAGNOSIS — I132 Hypertensive heart and chronic kidney disease with heart failure and with stage 5 chronic kidney disease, or end stage renal disease: Secondary | ICD-10-CM | POA: Insufficient documentation

## 2018-12-09 DIAGNOSIS — N185 Chronic kidney disease, stage 5: Secondary | ICD-10-CM | POA: Diagnosis not present

## 2018-12-09 DIAGNOSIS — S0181XA Laceration without foreign body of other part of head, initial encounter: Secondary | ICD-10-CM

## 2018-12-09 DIAGNOSIS — S01111A Laceration without foreign body of right eyelid and periocular area, initial encounter: Secondary | ICD-10-CM | POA: Diagnosis not present

## 2018-12-09 DIAGNOSIS — W010XXA Fall on same level from slipping, tripping and stumbling without subsequent striking against object, initial encounter: Secondary | ICD-10-CM | POA: Insufficient documentation

## 2018-12-09 DIAGNOSIS — Z87891 Personal history of nicotine dependence: Secondary | ICD-10-CM | POA: Diagnosis not present

## 2018-12-09 DIAGNOSIS — E1122 Type 2 diabetes mellitus with diabetic chronic kidney disease: Secondary | ICD-10-CM | POA: Insufficient documentation

## 2018-12-09 DIAGNOSIS — Z79899 Other long term (current) drug therapy: Secondary | ICD-10-CM | POA: Insufficient documentation

## 2018-12-09 DIAGNOSIS — S0990XA Unspecified injury of head, initial encounter: Secondary | ICD-10-CM | POA: Diagnosis present

## 2018-12-09 MED ORDER — LIDOCAINE-EPINEPHRINE (PF) 2 %-1:200000 IJ SOLN
20.0000 mL | Freq: Once | INTRAMUSCULAR | Status: DC
  Filled 2018-12-09: qty 20

## 2018-12-09 NOTE — ED Provider Notes (Signed)
Medical screening examination/treatment/procedure(s) were conducted as a shared visit with non-physician practitioner(s) and myself.  I personally evaluated the patient during the encounter.  EKG Interpretation  Date/Time:  Saturday December 09 2018 10:55:57 EDT Ventricular Rate:  77 PR Interval:    QRS Duration: 110 QT Interval:  456 QTC Calculation: 517 R Axis:   -34 Text Interpretation:  Sinus rhythm Left ventricular hypertrophy Nonspecific T abnrm, anterolateral leads Prolonged QT interval New prolonged QT Confirmed by Fredia Sorrow [phone]) on 12/09/2018 11:01:42 AM   Patient seen by me along with physician assistant.  Patient came in today for a fall that occurred at his house.  He got excited normally uses a walker to walk physical therapy had arrived to his house to do his first physical therapy treatment.  And he fell resulting in a laceration above his right eye.  No loss of consciousness no other injuries.  Patient reports being on blood thinners.  Patient will have CT head.  EKG without any significant arrhythmias.  Does not sound as if there was a syncopal episode.  Physician assistant will repair the laceration.  Disposition will be based on the head CT findings.  We also recommend that we ambulate the patient to make sure he he walks reasonably.  Again he does use a walker at home.   Fredia Sorrow, MD 12/09/18 1148

## 2018-12-09 NOTE — Discharge Instructions (Signed)
It was my pleasure taking care of you today!   Keep wound clean with mild soap and water. Ice for additional pain relief and swelling Tylenol for additional pain relief. Follow up with your primary care doctor, urgent care or ER in approximately 5-7 days for wound recheck and suture removal. Monitor area for signs of infection to include, but not limited to: increasing pain, spreading redness, drainage/pus, worsening swelling, or fevers. Return to emergency department for emergent changing or worsening symptoms.

## 2018-12-09 NOTE — ED Provider Notes (Signed)
[facility] EMERGENCY DEPARTMENT Provider Note   CSN: [id number] Arrival date & time: 12/09/18  1003    History   Chief Complaint Chief Complaint  Patient presents with  . Laceration    HPI Steven Perez is a 68 y.o. male.     The history is provided by the patient and medical records. No language interpreter was used.  Laceration  Steven Perez is a 68 y.o. male  with a PMH as listed below who presents to the Emergency Department for evaluation after fall just prior to arrival.  Patient states that it was the day of his first physical therapy appointment.  He realized that the physical therapist was outside and "got excited" and in a rush to get the door.  He reports that he feels as if he was just moving a little too quickly and fell forward.  He did hit his head, wearing his glasses and sustained a laceration to the right upper eyebrow.  He reports having severe allergy to tetanus vaccine and was told to never take it again.  Denies loss of consciousness, dizziness, chest pain or shortness of breath.  No nausea or vomiting.    Past Medical History:  Diagnosis Date  . Acute on chronic renal insufficiency   . Anemia    assoc w/ chronic reanl failure  . Arthralgia   . Chronic kidney disease    Stage 4  . Diabetes mellitus   . Dyslipidemia   . Erectile dysfunction due to arterial insufficiency   . HLD (hyperlipidemia)   . Hypertension   . Insomnia   . Left renal mass   . Obesity   . Secondary hyperparathyroidism of renal origin ([place])   . Type 2 diabetes mellitus [facility])     Patient Active Problem List   Diagnosis Date Noted  . Acute respiratory failure with hypoxia and hypercapnia ([place]) 12/01/2018  . Acute systolic CHF (congestive heart failure) ([place]) 12/01/2018  . Left renal mass   . Pre-transplant evaluation for CKD (chronic kidney disease) 06/19/2014  . Obesity 06/19/2014  . Hyperlipidemia 06/19/2014  . Complex renal cyst 06/19/2014  . CKD (chronic kidney  disease) stage 5, GFR less than 15 ml/min (HCC) 06/19/2014  . Erectile dysfunction 06/19/2014  . Renal failure (ARF), acute on chronic (HCC) 11/27/2013  . Diabetes mellitus ([place]) 11/27/2013  . HTN (hypertension) 11/27/2013  . Anemia 11/27/2013  . Metabolic acidosis 62/69/4854  . Acute renal failure ([place]) 11/27/2013  . AKI (acute kidney injury) ([place]) 11/26/2013    Past Surgical History:  Procedure Laterality Date  . AV FISTULA PLACEMENT Left 11/02/2016   Procedure: ARTERIOVENOUS (AV) FISTULA CREATION LEFT UPPER ARM;  Surgeon: Elam Dutch, MD;  Location: [facility];  Service: Vascular;  Laterality: Left;  . IR GENERIC HISTORICAL  06/04/2014   IR RADIOLOGIST EVAL & MGMT 06/04/2014 Aletta Edouard, MD [facility] INTERV RAD  . IR GENERIC HISTORICAL  01/15/2016   IR RADIOLOGIST EVAL & MGMT 01/15/2016 [facility] INTERV RAD  . IR RADIOLOGIST EVAL & MGMT  01/25/2017  . RENAL BIOPSY    . RENAL BIOPSY, PERCUTANEOUS    . TONSILLECTOMY          Home Medications    Prior to Admission medications   Medication Sig Start Date End Date Taking? Authorizing Provider  calcitRIOL (ROCALTROL) 0.25 MCG capsule Take 0.25 mcg by mouth daily.   Yes [provider]  calcium acetate (PHOSLO) 667 MG capsule Take 1 capsule (667 mg total) by mouth 3 (three)  times daily with meals. 12/07/18 01/06/19 Yes Shah, Pratik D, DO  carvedilol (COREG) 12.5 MG tablet Take 1 tablet (12.5 mg total) by mouth 2 (two) times daily with a meal. 12/07/18 01/06/19 Yes Shah, Pratik D, DO  doxazosin (CARDURA) 4 MG tablet Take 4 mg by mouth at bedtime.   Yes [provider]  epoetin alfa (EPOGEN,PROCRIT) 63016 UNIT/ML injection 10,000 Units every 30 (thirty) days.   Yes Elmarie Shiley, MD  epoetin alfa (EPOGEN,PROCRIT) 01093 UNIT/ML injection 20,000 Units every 30 (thirty) days.   Yes [provider]  furosemide (LASIX) 40 MG tablet Take 2 tablets (80 mg total) by mouth daily. 12/08/18 01/07/19 Yes Shah, Pratik D, DO  hydrALAZINE  (APRESOLINE) 25 MG tablet Take 25 mg by mouth 2 (two) times daily.   Yes [provider]  Insulin Degludec (TRESIBA FLEXTOUCH) 200 UNIT/ML SOPN Inject 20 Units into the skin at bedtime.   Yes [provider]  isosorbide mononitrate (IMDUR) 30 MG 24 hr tablet Take 0.5 tablets (15 mg total) by mouth daily. 12/07/18 01/06/19 Yes Shah, Pratik D, DO  polyethylene glycol (MIRALAX / GLYCOLAX) 17 g packet Take 17 g by mouth daily. 12/07/18  Yes Shah, Pratik D, DO  potassium chloride SA (K-DUR) 20 MEQ tablet Take 1 tablet (20 mEq total) by mouth daily. 12/08/18 01/07/19 Yes Shah, Pratik D, DO  pravastatin (PRAVACHOL) 20 MG tablet Take 20 mg by mouth at bedtime.   Yes [provider]  traZODone (DESYREL) 100 MG tablet Take 100 mg by mouth at bedtime.   Yes [provider]  verapamil (CALAN-SR) 180 MG CR tablet Take 2 tablets (360 mg total) by mouth at bedtime. 12/04/13  Yes Short, Noah Delaine, MD  Vitamin D, Ergocalciferol, (DRISDOL) 50000 UNITS CAPS capsule Take 50,000 Units by mouth every Wednesday.    Yes [provider]    Family History Family History  Problem Relation Age of Onset  . Diabetes Mellitus II Mother   . CAD Neg Hx   . Stroke Neg Hx     Social History Social History   Tobacco Use  . Smoking status: Former Research scientist (life sciences)  . Smokeless tobacco: Never Used  Substance Use Topics  . Alcohol use: No    Comment: occasionally  . Drug use: No     Allergies   Contrast media [iodinated diagnostic agents] and Tetanus toxoids   Review of Systems Review of Systems  Skin: Positive for wound.  Neurological: Positive for headaches.  All other systems reviewed and are negative.    Physical Exam Updated Vital Signs BP 120/66   Pulse 75   Temp 98.1 F (36.7 C) (Oral)   Resp (!) 22   Ht 5\' 8"  (1.727 m)   Wt 91.7 kg   SpO2 100%   BMI 30.74 kg/m   Physical Exam Vitals signs and nursing note reviewed.  Constitutional:      General: He is not in acute  distress.    Appearance: He is well-developed.  HENT:     Head: Normocephalic.     Comments: 3 cm laceration to the right eyebrow.  No battle sign or raccoon eyes.  No hemotympanum.  Neck:     Musculoskeletal: Neck supple.  Cardiovascular:     Rate and Rhythm: Normal rate and regular rhythm.     Heart sounds: Normal heart sounds. No murmur.  Pulmonary:     Effort: Pulmonary effort is normal. No respiratory distress.     Breath sounds: Normal breath sounds.  Abdominal:     General: There is no distension.     Palpations: Abdomen is soft.     Tenderness: There is no abdominal tenderness.  Skin:    General: Skin is warm and dry.  Neurological:     Mental Status: He is alert and oriented to person, place, and time.     Comments: Alert, oriented, thought content appropriate, able to give a coherent history. Speech is clear and goal oriented, able to follow commands.  Cranial Nerves:  II:  Peripheral visual fields grossly normal, pupils equal, round, reactive to light III, IV, VI: EOM intact bilaterally, ptosis not present V,VII: smile symmetric, eyes kept closed tightly against resistance, facial light touch sensation equal VIII: hearing grossly normal IX, X: symmetric soft palate movement, uvula elevates symmetrically  XI: bilateral shoulder shrug symmetric and strong XII: midline tongue extension 5/5 muscle strength in upper and lower extremities bilaterally including strong and equal grip strength and dorsiflexion/plantar flexion Sensory to light touch normal in all four extremities.      ED Treatments / Results  Labs (all labs ordered are listed, but only abnormal results are displayed) Labs Reviewed - No data to display  EKG EKG Interpretation  Date/Time:  Saturday December 09 2018 10:55:57 EDT Ventricular Rate:  77 PR Interval:    QRS Duration: 110 QT Interval:  456 QTC Calculation: 517 R Axis:   -34 Text Interpretation:  Sinus rhythm Left ventricular hypertrophy  Nonspecific T abnrm, anterolateral leads Prolonged QT interval New prolonged QT Confirmed by Fredia Sorrow [phone]) on 12/09/2018 11:01:42 AM   Radiology Ct Head Wo Contrast  Result Date: 12/09/2018 CLINICAL DATA:  Status post fall today. Laceration above the right eye. Initial encounter. EXAM: CT HEAD WITHOUT CONTRAST TECHNIQUE: Contiguous axial images were obtained from the base of the skull through the vertex without intravenous contrast. COMPARISON:  None. FINDINGS: Brain: No evidence of acute infarction, hemorrhage, hydrocephalus, extra-axial collection or mass lesion/mass effect. Vascular: No hyperdense vessel or unexpected calcification. Skull: Normal. Negative for fracture or focal lesion. Sinuses/Orbits: Negative. Other: None. IMPRESSION: Negative head CT. Electronically Signed   By: Inge Rise M.D.   On: 12/09/2018 12:45    Procedures .Marland KitchenLaceration Repair  Date/Time: 12/09/2018 12:12 PM Performed by: Ward, Ozella Almond, PA-C Authorized by: Ward, Ozella Almond, PA-C   Consent:    Consent obtained:  Verbal   Consent given by:  Patient   Risks discussed:  Pain, infection, poor cosmetic result and poor wound healing Anesthesia (see MAR for exact dosages):    Anesthesia method:  Local infiltration   Local anesthetic:  Lidocaine 2% WITH epi Laceration details:    Location:  Face   Face location:  R eyebrow   Length (cm):  3 Repair type:    Repair type:  Simple Pre-procedure details:    Preparation:  Patient was prepped and draped in usual sterile fashion Exploration:    Hemostasis achieved with:  Direct pressure   Wound exploration: entire depth of wound probed and visualized   Treatment:    Area cleansed with:  Saline and Betadine   Amount of cleaning:  Standard   Irrigation solution:  Sterile saline Skin repair:    Repair method:  Sutures   Suture size:  5-0   Suture material:  Prolene   Suture technique:  Simple interrupted   Number of sutures:  4  Approximation:    Approximation:  Close Post-procedure details:    Dressing:  Open (no dressing)   Patient  tolerance of procedure:  Tolerated well, no immediate complications   (including critical care time)  Medications Ordered in ED Medications  lidocaine-EPINEPHrine (XYLOCAINE W/EPI) 2 %-1:200000 (PF) injection 20 mL (has no administration in time range)     Initial Impression / Assessment and Plan / ED Course  I have reviewed the triage vital signs and the nursing notes.  Pertinent labs & imaging results that were available during my care of the patient were reviewed by me and considered in my medical decision making (see chart for details).       Steven Perez is a 68 y.o. male who presents to ED for evaluation after likely mechanical fall just prior to arrival.  He did hit his head.  No focal neurologic deficits appreciated on his exam.  CT head without acute findings.  Did sustain laceration to the right eyebrow which was repaired as dictated above.  He is allergic to tetanus and was told never to take it again, therefore tetanus was not updated at this encounter.  Discussed home wound care, return precautions and follow-up care plan.  He has appointment with his primary care doctor on Monday.  All questions answered.   Patient seen by and discussed with Dr. Rogene Houston who agrees with treatment plan.    Final Clinical Impressions(s) / ED Diagnoses   Final diagnoses:  Facial laceration, initial encounter    ED Discharge Orders    None       Ward, Ozella Almond, PA-C 12/09/18 1325    Fredia Sorrow, MD 12/10/18 [phone]

## 2018-12-09 NOTE — ED Triage Notes (Signed)
Pt lost balance and fell. Laceration above RT eye from glasses sliding up. Pt hospitalized recently was given heparin injections for dvt prophylaxis.

## 2018-12-27 ENCOUNTER — Encounter: Payer: Self-pay | Admitting: Student

## 2018-12-27 ENCOUNTER — Other Ambulatory Visit: Payer: Self-pay

## 2018-12-27 ENCOUNTER — Telehealth (INDEPENDENT_AMBULATORY_CARE_PROVIDER_SITE_OTHER): Payer: Medicare Other | Admitting: Student

## 2018-12-27 VITALS — BP 123/60 | HR 54 | Ht 68.0 in | Wt 204.1 lb

## 2018-12-27 DIAGNOSIS — I5042 Chronic combined systolic (congestive) and diastolic (congestive) heart failure: Secondary | ICD-10-CM | POA: Diagnosis not present

## 2018-12-27 DIAGNOSIS — N185 Chronic kidney disease, stage 5: Secondary | ICD-10-CM

## 2018-12-27 DIAGNOSIS — I10 Essential (primary) hypertension: Secondary | ICD-10-CM

## 2018-12-27 DIAGNOSIS — E785 Hyperlipidemia, unspecified: Secondary | ICD-10-CM

## 2018-12-27 MED ORDER — CARVEDILOL 6.25 MG PO TABS: 6.2500 mg | ORAL_TABLET | Freq: Two times a day (BID) | ORAL | 3 refills | Status: DC

## 2018-12-27 NOTE — Patient Instructions (Signed)
Medication Instructions:  Your physician has recommended you make the following change in your medication:  STOP Taking Verapamil  Restart Taking Coreg 6.25 mg Two Times Daily   If you need a refill on your cardiac medications before your next appointment, please call your pharmacy.   Lab work: NONE  If you have labs (blood work) drawn today and your tests are completely normal, you will receive your results only by: Marland Kitchen MyChart Message (if you have MyChart) OR . A paper copy in the mail If you have any lab test that is abnormal or we need to change your treatment, we will call you to review the results.  Testing/Procedures: NONE   Follow-Up: At [facility], you and your health needs are our priority.  As part of our continuing mission to provide you with exceptional heart care, we have created designated Provider Care Teams.  These Care Teams include your primary Cardiologist (physician) and Advanced Practice Providers (APPs -  Physician Assistants and Nurse Practitioners) who all work together to provide you with the care you need, when you need it. You will need a follow up appointment in 2-3 months.  Please call our office 2 months in advance to schedule this appointment.  You may see Carlyle Dolly, MD or one of the following Advanced Practice Providers on your designated Care Team:   Bernerd Pho, PA-C [facility]) . Ermalinda Barrios, PA-C ([place])  Any Other Special Instructions Will Be Listed Below (If Applicable). Thank you for choosing [facility]!

## 2018-12-27 NOTE — Progress Notes (Signed)
Virtual Visit via Video Note   This visit type was conducted due to national recommendations for restrictions regarding the COVID-19 Pandemic (e.g. social distancing) in an effort to limit this patient's exposure and mitigate transmission in our community.  Due to his co-morbid illnesses, this patient is at least at moderate risk for complications without adequate follow up.  This format is felt to be most appropriate for this patient at this time.  All issues noted in this document were discussed and addressed.  A limited physical exam was performed with this format.  Please refer to the patient's chart for his consent to telehealth for [facility].   Date:  12/27/2018   ID:  Steven Perez, DOB 09-29-1950, MRN [id number]  Patient Location: Home Provider Location: Office  PCP:  Redmond School, MD  Cardiologist:  Carlyle Dolly, MD  Electrophysiologist:  None   Evaluation Performed:  Follow-Up Visit  Chief Complaint:  Low Heart Rate  History of Present Illness:    Steven Perez is a 67 y.o. 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   He was most recently admitted to [facility] from 12/01/2018 to 12/07/2018 for evaluation of worsening dyspnea and lower extremity edema.Prior echocardiogram imaging from [facility] was reviewed and showed an EF of 40% in 04/2018 with his prior Myoview showing no ischemia. A repeat echocardiogram was performed during admission and showed his EF is now reduced to 25 to 30%. Diuretic dosing was deferred to Nephrology in the setting of his CKD. Given his high risk for contrast-induced nephropathy and having not yet started HD, a cardiac catheterization was not pursued during admission. He was continued on Coreg along with Hydralazine 25mg  BID and Imdur 15mg  daily. Was discharged on Lasix 80mg  daily with creatinine at 5.60. Weight was  at 93.3 kg.   In talking with the patient today, he reports that he was initially fatigued following hospital discharge and started to check his vitals closely and noted that his HR was in the 40's at times, therefore he stopped Coreg. Says that he has remained on Verapamil (was discontinued during admission but was listed on his discharge summary to continue).  Since hospital discharge he reports "feeling the best he has felt in years". Says that his weight has continued to decline on his home scales and breathing has been at baseline.  He denies any recent chest pain, orthopnea, PND, lower extremity edema, or palpitations.  Today is his 49nd wedding anniversary!   The patient does not have symptoms concerning for COVID-19 infection (fever, chills, cough, or new shortness of breath).    Past Medical History:  Diagnosis Date  . Acute on chronic renal insufficiency   . Anemia    assoc w/ chronic reanl failure  . Arthralgia   . Chronic kidney disease    Stage 4  . Diabetes mellitus   . Dyslipidemia   . Erectile dysfunction due to arterial insufficiency   . HLD (hyperlipidemia)   . Hypertension   . Insomnia   . Left renal mass   . Obesity   . Secondary hyperparathyroidism of renal origin ([place])   . Type 2 diabetes mellitus ([place])    Past Surgical History:  Procedure Laterality Date  . AV FISTULA PLACEMENT Left 11/02/2016   Procedure: ARTERIOVENOUS (AV) FISTULA CREATION LEFT UPPER ARM;  Surgeon: Elam Dutch, MD;  Location: [place];  Service: Vascular;  Laterality: Left;  . IR GENERIC HISTORICAL  06/04/2014   IR RADIOLOGIST EVAL & MGMT 06/04/2014 Aletta Edouard, MD [facility] INTERV RAD  . IR GENERIC HISTORICAL  01/15/2016   IR RADIOLOGIST EVAL & MGMT 01/15/2016 [facility] INTERV RAD  . IR RADIOLOGIST EVAL & MGMT  01/25/2017  . RENAL BIOPSY    . RENAL BIOPSY, PERCUTANEOUS    . TONSILLECTOMY       Current Meds  Medication Sig  . doxazosin (CARDURA) 4 MG tablet Take 2 mg by mouth at bedtime.    Marland Kitchen epoetin alfa (EPOGEN,PROCRIT) 18563 UNIT/ML injection 10,000 Units every 30 (thirty) days.  Marland Kitchen epoetin alfa (EPOGEN,PROCRIT) 14970 UNIT/ML injection 20,000 Units every 30 (thirty) days.  . furosemide (LASIX) 40 MG tablet Take 2 tablets (80 mg total) by mouth daily.  . hydrALAZINE (APRESOLINE) 25 MG tablet Take 25 mg by mouth 2 (two) times daily.  . Insulin Degludec (TRESIBA FLEXTOUCH) 200 UNIT/ML SOPN Inject 20 Units into the skin at bedtime.  . isosorbide mononitrate (IMDUR) 30 MG 24 hr tablet Take 0.5 tablets (15 mg total) by mouth daily.  . polyethylene glycol (MIRALAX / GLYCOLAX) 17 g packet Take 17 g by mouth daily.  . potassium chloride SA (K-DUR) 20 MEQ tablet Take 1 tablet (20 mEq total) by mouth daily.  . pravastatin (PRAVACHOL) 20 MG tablet Take 20 mg by mouth at bedtime.  . traZODone (DESYREL) 100 MG tablet Take 100 mg by mouth at bedtime.  . Vitamin D, Ergocalciferol, (DRISDOL) 50000 UNITS CAPS capsule Take 50,000 Units by mouth every Wednesday.   . [DISCONTINUED] verapamil (CALAN-SR) 180 MG CR tablet Take 2 tablets (360 mg total) by mouth at bedtime.     Allergies:   Contrast media [iodinated diagnostic agents] and Tetanus toxoids   Social History   Tobacco Use  . Smoking status: Former Research scientist (life sciences)  . Smokeless tobacco: Never Used  Substance Use Topics  . Alcohol use: No    Comment: occasionally  . Drug use: No     Family Hx: The patient's family history includes Diabetes Mellitus II in his mother. There is no history of CAD or Stroke.  ROS:   Please see the history of present illness.     All other systems reviewed and are negative.   Prior CV studies:   The following studies were reviewed today:  NST: 04/2018 FINDINGS:  Unexpected: None.  Perfusion defects: No reversible perfusion defects. Decreased activity in the inferior wall improves on stress imaging and may be artifactual.  TID ratio: <1.25; no transient ischemic dilatation on visual analysis.  Gated  images:  Likely spurious due to adjacent bowel activity. Unable to interpret. Defer to ejection fraction and wall motion analysis on the echocardiogram performed today.  CONCLUSION:  No inducible ischemia.  Echocardiogram: 11/2018 IMPRESSIONS    1. The left ventricle has severely reduced systolic function, with an ejection fraction of 25-30%. The cavity size was normal. There is moderate concentric left ventricular hypertrophy. Left ventricular diastolic Doppler parameters are consistent with  restrictive filling. Elevated left atrial and left ventricular end-diastolic pressures Left ventricular diffuse hypokinesis.  2. The right ventricle has mildly reduced systolic function. The cavity was moderately enlarged. There is no increase in right ventricular wall thickness.  3. Left atrial size was moderately dilated.  4. Right atrial size was mildly dilated.  5. The aortic valve is tricuspid. Aortic valve regurgitation was not assessed by color flow Doppler.  Labs/Other Tests and Data Reviewed:  EKG:  No ECG reviewed.  Recent Labs: 12/01/2018: ALT 15 12/02/2018: TSH 1.429 12/05/2018: Hemoglobin 13.3; Platelets 286 12/07/2018: BUN 100; Creatinine, Ser 5.60; Magnesium 2.4; Potassium 3.1; Sodium 134   Recent Lipid Panel No results found for: CHOL, TRIG, HDL, CHOLHDL, LDLCALC, LDLDIRECT  Wt Readings from Last 3 Encounters:  12/27/18 204 lb 1.6 oz (92.6 kg)  12/09/18 202 lb 3.2 oz (91.7 kg)  12/07/18 205 lb 11 oz (93.3 kg)     Objective:    Vital Signs:  BP 123/60   Pulse (!) 54   Ht 5\' 8"  (1.727 m)   Wt 204 lb 1.6 oz (92.6 kg)   SpO2 99%   BMI 31.03 kg/m    General: Pleasant male appearing in NAD Psych: Normal affect. Neuro: Alert and oriented X 3. Moves all extremities spontaneously. HEENT: Normal in appearance.   Lungs:  Resp regular and unlabored by video assessment.    ASSESSMENT & PLAN:    1. Chronic Combined Systolic and Diastolic CHF - EF was 31% by prior  echocardiogram in 04/2018, further reduced at 25-30% when rechecked earlier this month. Recent NST in 04/2018 showed no reversible ischemia and a cardiac catheterization has not been pursued given his advanced CKD. - He has overall done well since hospital discharge and denies any dyspnea on exertion, orthopnea, PND, or lower extremity edema. Weight has continued to decline on his home scales. - Diuretic dosing has been managed by Nephrology and he remains on Lasix 80 mg daily. - He was having issues with bradycardia and self-discontinued Coreg but by review of notes Verapamil was discontinued during admission given his cardiomyopathy but was not removed on his AVS therefore he continued on the medication. Reviewed the reasoning behind stopping this during today's visit. Will stop Verapamil and restart Coreg at 6.25 mg twice daily. Pending HR and BP response, would further titrate to 12.5mg  BID. Continue Hydralazine and Imdur. Not on ACE-I/ARB/ARNI given Stage 5 CKD.   2. HTN - BP has been well-controlled when checked at home, at 123/60 on most recent check. Given we are stopping Verapamil I encouraged him to continue to follow BP and report back with his numbers next week. Continue Hydralazine and Imdur at current dosing.   3. HLD - followed by PCP. He remains on Pravastatin 20mg  daily.   4. Stage 5 CKD - creatinine 5.60 at the time of recent hospital discharge. Followed by [facility].   COVID-19 Education: The signs and symptoms of COVID-19 were discussed with the patient and how to seek care for testing (follow up with PCP or arrange E-visit).  The importance of social distancing was discussed today.  Time:   Today, I have spent 22 minutes with the patient with telehealth technology discussing the above problems.     Medication Adjustments/Labs and Tests Ordered: Current medicines are reviewed at length with the patient today.  Concerns regarding medicines are outlined above.   Tests  Ordered: No orders of the defined types were placed in this encounter.   Medication Changes: Meds ordered this encounter  Medications  . carvedilol (COREG) 6.25 MG tablet    Sig: Take 1 tablet (6.25 mg total) by mouth 2 (two) times daily.    Dispense:  180 tablet    Refill:  3    Follow Up:  In Person in 2-3 months .  Signed, Erma Heritage, PA-C  12/27/2018 4:49 PM    [facility]

## 2018-12-29 ENCOUNTER — Other Ambulatory Visit: Payer: Self-pay

## 2018-12-29 ENCOUNTER — Encounter (HOSPITAL_COMMUNITY)
Admission: RE | Admit: 2018-12-29 | Discharge: 2018-12-29 | Disposition: A | Discharge status: Home / Self Care | Payer: Medicare Other | Source: Ambulatory Visit | Attending: Nephrology | Admitting: Nephrology

## 2018-12-29 ENCOUNTER — Encounter (HOSPITAL_COMMUNITY): Payer: Self-pay

## 2018-12-29 DIAGNOSIS — N185 Chronic kidney disease, stage 5: Secondary | ICD-10-CM | POA: Insufficient documentation

## 2018-12-29 DIAGNOSIS — D631 Anemia in chronic kidney disease: Secondary | ICD-10-CM | POA: Insufficient documentation

## 2018-12-29 LAB — POCT HEMOGLOBIN-HEMACUE: Hemoglobin: 14.5 g/dL (ref 13.0–17.0)

## 2018-12-29 MED ORDER — EPOETIN ALFA 20000 UNIT/ML IJ SOLN: 20000.0000 [IU] | Freq: Once | INTRAMUSCULAR | Status: DC

## 2019-01-01 ENCOUNTER — Telehealth: Payer: Self-pay | Admitting: Cardiology

## 2019-01-01 MED ORDER — CARVEDILOL 12.5 MG PO TABS: 12.5000 mg | ORAL_TABLET | Freq: Two times a day (BID) | ORAL | 3 refills | Status: DC

## 2019-01-01 NOTE — Telephone Encounter (Signed)
    I am glad to see his HR has improved with discontinuation of Verapamil. As discussed at his office visit, would prefer to titrate Coreg to help with his cardiomyopathy and remain off of Verapamil. Would therefore titrate Coreg to 12.5mg  BID. Continue to follow HR and BP. Pending his response over the next few weeks, may need to further titrate Coreg.   Signed, Erma Heritage, PA-C 01/01/2019, 11:38 AM Pager: [phone]

## 2019-01-01 NOTE — Telephone Encounter (Signed)
Patient will increase Coreg to 12.5 mg BID

## 2019-01-01 NOTE — Telephone Encounter (Signed)
Verapamil stopped, Coreg reduced to 6.25 mg BID on 12/27/18   BP 7/30          129/70  HR 63  7/31 146/76  HR 61  8/1 147/81    HR 66  8/2  144/83   HR 74  8/3 153/83    HR 68

## 2019-01-01 NOTE — Telephone Encounter (Signed)
Please give pt a call concerning BP readings -- can be reached @ [phone]

## 2019-01-26 ENCOUNTER — Encounter (HOSPITAL_COMMUNITY)
Admission: RE | Admit: 2019-01-26 | Discharge: 2019-01-26 | Disposition: A | Discharge status: Home / Self Care | Payer: Medicare Other | Source: Ambulatory Visit | Attending: Nephrology | Admitting: Nephrology

## 2019-01-26 ENCOUNTER — Other Ambulatory Visit: Payer: Self-pay

## 2019-01-26 DIAGNOSIS — N185 Chronic kidney disease, stage 5: Secondary | ICD-10-CM | POA: Insufficient documentation

## 2019-01-26 DIAGNOSIS — D631 Anemia in chronic kidney disease: Secondary | ICD-10-CM | POA: Diagnosis not present

## 2019-01-26 LAB — CBC WITH DIFFERENTIAL/PLATELET
Abs Immature Granulocytes: 0.04 10*3/uL (ref 0.00–0.07)
Basophils Absolute: 0 10*3/uL (ref 0.0–0.1)
Basophils Relative: 0 %
Eosinophils Absolute: 0.1 10*3/uL (ref 0.0–0.5)
Eosinophils Relative: 1 %
HCT: 39.4 % (ref 39.0–52.0)
Hemoglobin: 12.8 g/dL — ABNORMAL LOW (ref 13.0–17.0)
Immature Granulocytes: 0 %
Lymphocytes Relative: 12 %
Lymphs Abs: 1.3 10*3/uL (ref 0.7–4.0)
MCH: 29.3 pg (ref 26.0–34.0)
MCHC: 32.5 g/dL (ref 30.0–36.0)
MCV: 90.2 fL (ref 80.0–100.0)
Monocytes Absolute: 0.7 10*3/uL (ref 0.1–1.0)
Monocytes Relative: 7 %
Neutro Abs: 8.7 10*3/uL — ABNORMAL HIGH (ref 1.7–7.7)
Neutrophils Relative %: 80 %
Platelets: 284 10*3/uL (ref 150–400)
RBC: 4.37 MIL/uL (ref 4.22–5.81)
RDW: 13.1 % (ref 11.5–15.5)
WBC: 10.9 10*3/uL — ABNORMAL HIGH (ref 4.0–10.5)
nRBC: 0 % (ref 0.0–0.2)

## 2019-01-26 LAB — RENAL FUNCTION PANEL
Albumin: 3.8 g/dL (ref 3.5–5.0)
Anion gap: 9 (ref 5–15)
BUN: 43 mg/dL — ABNORMAL HIGH (ref 8–23)
CO2: 26 mmol/L (ref 22–32)
Calcium: 9 mg/dL (ref 8.9–10.3)
Chloride: 103 mmol/L (ref 98–111)
Creatinine, Ser: 4.48 mg/dL — ABNORMAL HIGH (ref 0.61–1.24)
GFR calc Af Amer: 15 mL/min — ABNORMAL LOW (ref >60)
GFR calc non Af Amer: 13 mL/min — ABNORMAL LOW (ref >60)
Glucose, Bld: 112 mg/dL — ABNORMAL HIGH (ref 70–99)
Phosphorus: 3.9 mg/dL (ref 2.5–4.6)
Potassium: 4.1 mmol/L (ref 3.5–5.1)
Sodium: 138 mmol/L (ref 135–145)

## 2019-01-26 LAB — FERRITIN: Ferritin: 936 ng/mL — ABNORMAL HIGH (ref 24–336)

## 2019-01-26 LAB — POCT HEMOGLOBIN-HEMACUE: Hemoglobin: 12.8 g/dL — ABNORMAL LOW (ref 13.0–17.0)

## 2019-01-26 LAB — IRON AND TIBC
Iron: 93 ug/dL (ref 45–182)
Saturation Ratios: 34 % (ref 17.9–39.5)
TIBC: 271 ug/dL (ref 250–450)
UIBC: 178 ug/dL

## 2019-02-23 ENCOUNTER — Encounter (HOSPITAL_COMMUNITY)
Admission: RE | Admit: 2019-02-23 | Discharge: 2019-02-23 | Disposition: A | Discharge status: Home / Self Care | Payer: Medicare Other | Source: Ambulatory Visit | Attending: Nephrology | Admitting: Nephrology

## 2019-02-23 ENCOUNTER — Other Ambulatory Visit: Payer: Self-pay

## 2019-02-23 DIAGNOSIS — D631 Anemia in chronic kidney disease: Secondary | ICD-10-CM | POA: Diagnosis not present

## 2019-02-23 DIAGNOSIS — N185 Chronic kidney disease, stage 5: Secondary | ICD-10-CM | POA: Diagnosis not present

## 2019-02-23 LAB — RENAL FUNCTION PANEL
Albumin: 4 g/dL (ref 3.5–5.0)
Anion gap: 11 (ref 5–15)
BUN: 46 mg/dL — ABNORMAL HIGH (ref 8–23)
CO2: 24 mmol/L (ref 22–32)
Calcium: 9 mg/dL (ref 8.9–10.3)
Chloride: 103 mmol/L (ref 98–111)
Creatinine, Ser: 4.58 mg/dL — ABNORMAL HIGH (ref 0.61–1.24)
GFR calc Af Amer: 14 mL/min — ABNORMAL LOW (ref >60)
GFR calc non Af Amer: 12 mL/min — ABNORMAL LOW (ref >60)
Glucose, Bld: 93 mg/dL (ref 70–99)
Phosphorus: 4.2 mg/dL (ref 2.5–4.6)
Potassium: 3.7 mmol/L (ref 3.5–5.1)
Sodium: 138 mmol/L (ref 135–145)

## 2019-02-23 LAB — CBC WITH DIFFERENTIAL/PLATELET
Abs Immature Granulocytes: 0.03 10*3/uL (ref 0.00–0.07)
Basophils Absolute: 0 10*3/uL (ref 0.0–0.1)
Basophils Relative: 0 %
Eosinophils Absolute: 0.2 10*3/uL (ref 0.0–0.5)
Eosinophils Relative: 2 %
HCT: 40.8 % (ref 39.0–52.0)
Hemoglobin: 13.1 g/dL (ref 13.0–17.0)
Immature Granulocytes: 0 %
Lymphocytes Relative: 14 %
Lymphs Abs: 1.1 10*3/uL (ref 0.7–4.0)
MCH: 29 pg (ref 26.0–34.0)
MCHC: 32.1 g/dL (ref 30.0–36.0)
MCV: 90.3 fL (ref 80.0–100.0)
Monocytes Absolute: 0.6 10*3/uL (ref 0.1–1.0)
Monocytes Relative: 8 %
Neutro Abs: 6.3 10*3/uL (ref 1.7–7.7)
Neutrophils Relative %: 76 %
Platelets: 261 10*3/uL (ref 150–400)
RBC: 4.52 MIL/uL (ref 4.22–5.81)
RDW: 13.3 % (ref 11.5–15.5)
WBC: 8.2 10*3/uL (ref 4.0–10.5)
nRBC: 0 % (ref 0.0–0.2)

## 2019-02-23 LAB — FERRITIN: Ferritin: 943 ng/mL — ABNORMAL HIGH (ref 24–336)

## 2019-02-23 LAB — IRON AND TIBC
Iron: 95 ug/dL (ref 45–182)
Saturation Ratios: 32 % (ref 17.9–39.5)
TIBC: 297 ug/dL (ref 250–450)
UIBC: 202 ug/dL

## 2019-02-23 LAB — MAGNESIUM: Magnesium: 2.2 mg/dL (ref 1.7–2.4)

## 2019-02-23 LAB — POCT HEMOGLOBIN-HEMACUE: Hemoglobin: 13.1 g/dL (ref 13.0–17.0)

## 2019-03-07 ENCOUNTER — Other Ambulatory Visit: Payer: Self-pay | Admitting: Interventional Radiology

## 2019-03-07 DIAGNOSIS — N2889 Other specified disorders of kidney and ureter: Secondary | ICD-10-CM

## 2019-03-23 ENCOUNTER — Encounter (HOSPITAL_COMMUNITY)
Admission: RE | Admit: 2019-03-23 | Discharge: 2019-03-23 | Disposition: A | Discharge status: Home / Self Care | Payer: Medicare Other | Source: Ambulatory Visit | Attending: Nephrology | Admitting: Nephrology

## 2019-03-23 ENCOUNTER — Other Ambulatory Visit: Payer: Self-pay

## 2019-03-23 DIAGNOSIS — D631 Anemia in chronic kidney disease: Secondary | ICD-10-CM | POA: Insufficient documentation

## 2019-03-23 DIAGNOSIS — N185 Chronic kidney disease, stage 5: Secondary | ICD-10-CM | POA: Diagnosis not present

## 2019-03-23 LAB — IRON AND TIBC
Iron: 97 ug/dL (ref 45–182)
Saturation Ratios: 33 % (ref 17.9–39.5)
TIBC: 296 ug/dL (ref 250–450)
UIBC: 199 ug/dL

## 2019-03-23 LAB — RENAL FUNCTION PANEL
Albumin: 4.1 g/dL (ref 3.5–5.0)
Anion gap: 10 (ref 5–15)
BUN: 49 mg/dL — ABNORMAL HIGH (ref 8–23)
CO2: 23 mmol/L (ref 22–32)
Calcium: 8.9 mg/dL (ref 8.9–10.3)
Chloride: 105 mmol/L (ref 98–111)
Creatinine, Ser: 4.69 mg/dL — ABNORMAL HIGH (ref 0.61–1.24)
GFR calc Af Amer: 14 mL/min — ABNORMAL LOW (ref >60)
GFR calc non Af Amer: 12 mL/min — ABNORMAL LOW (ref >60)
Glucose, Bld: 89 mg/dL (ref 70–99)
Phosphorus: 4.4 mg/dL (ref 2.5–4.6)
Potassium: 4.2 mmol/L (ref 3.5–5.1)
Sodium: 138 mmol/L (ref 135–145)

## 2019-03-23 LAB — VITAMIN D 25 HYDROXY (VIT D DEFICIENCY, FRACTURES): Vit D, 25-Hydroxy: 110.26 ng/mL — ABNORMAL HIGH (ref 30–100)

## 2019-03-23 LAB — MAGNESIUM: Magnesium: 2 mg/dL (ref 1.7–2.4)

## 2019-03-23 LAB — CBC WITH DIFFERENTIAL/PLATELET
Abs Immature Granulocytes: 0.03 10*3/uL (ref 0.00–0.07)
Basophils Absolute: 0 10*3/uL (ref 0.0–0.1)
Basophils Relative: 0 %
Eosinophils Absolute: 0.2 10*3/uL (ref 0.0–0.5)
Eosinophils Relative: 2 %
HCT: 39.6 % (ref 39.0–52.0)
Hemoglobin: 12.8 g/dL — ABNORMAL LOW (ref 13.0–17.0)
Immature Granulocytes: 0 %
Lymphocytes Relative: 15 %
Lymphs Abs: 1.2 10*3/uL (ref 0.7–4.0)
MCH: 29.1 pg (ref 26.0–34.0)
MCHC: 32.3 g/dL (ref 30.0–36.0)
MCV: 90 fL (ref 80.0–100.0)
Monocytes Absolute: 0.5 10*3/uL (ref 0.1–1.0)
Monocytes Relative: 7 %
Neutro Abs: 6 10*3/uL (ref 1.7–7.7)
Neutrophils Relative %: 76 %
Platelets: 264 10*3/uL (ref 150–400)
RBC: 4.4 MIL/uL (ref 4.22–5.81)
RDW: 13.2 % (ref 11.5–15.5)
WBC: 8 10*3/uL (ref 4.0–10.5)
nRBC: 0 % (ref 0.0–0.2)

## 2019-03-23 LAB — POCT HEMOGLOBIN-HEMACUE: Hemoglobin: 12.5 g/dL — ABNORMAL LOW (ref 13.0–17.0)

## 2019-03-23 LAB — FERRITIN: Ferritin: 959 ng/mL — ABNORMAL HIGH (ref 24–336)

## 2019-03-24 LAB — PTH, INTACT AND CALCIUM
Calcium, Total (PTH): 9.4 mg/dL (ref 8.6–10.2)
PTH: 61 pg/mL (ref 15–65)

## 2019-04-20 ENCOUNTER — Other Ambulatory Visit: Payer: Self-pay

## 2019-04-20 ENCOUNTER — Encounter (HOSPITAL_COMMUNITY): Payer: Self-pay

## 2019-04-20 ENCOUNTER — Encounter (HOSPITAL_COMMUNITY)
Admission: RE | Admit: 2019-04-20 | Discharge: 2019-04-20 | Disposition: A | Discharge status: Home / Self Care | Payer: Medicare Other | Source: Ambulatory Visit | Attending: Nephrology | Admitting: Nephrology

## 2019-04-20 DIAGNOSIS — D631 Anemia in chronic kidney disease: Secondary | ICD-10-CM | POA: Diagnosis not present

## 2019-04-20 DIAGNOSIS — N185 Chronic kidney disease, stage 5: Secondary | ICD-10-CM | POA: Diagnosis not present

## 2019-04-20 LAB — CBC WITH DIFFERENTIAL/PLATELET
Abs Immature Granulocytes: 0.02 10*3/uL (ref 0.00–0.07)
Basophils Absolute: 0 10*3/uL (ref 0.0–0.1)
Basophils Relative: 0 %
Eosinophils Absolute: 0.2 10*3/uL (ref 0.0–0.5)
Eosinophils Relative: 3 %
HCT: 38.2 % — ABNORMAL LOW (ref 39.0–52.0)
Hemoglobin: 12.4 g/dL — ABNORMAL LOW (ref 13.0–17.0)
Immature Granulocytes: 0 %
Lymphocytes Relative: 17 %
Lymphs Abs: 1.4 10*3/uL (ref 0.7–4.0)
MCH: 29.5 pg (ref 26.0–34.0)
MCHC: 32.5 g/dL (ref 30.0–36.0)
MCV: 90.7 fL (ref 80.0–100.0)
Monocytes Absolute: 0.8 10*3/uL (ref 0.1–1.0)
Monocytes Relative: 9 %
Neutro Abs: 6 10*3/uL (ref 1.7–7.7)
Neutrophils Relative %: 71 %
Platelets: 258 10*3/uL (ref 150–400)
RBC: 4.21 MIL/uL — ABNORMAL LOW (ref 4.22–5.81)
RDW: 12.7 % (ref 11.5–15.5)
WBC: 8.4 10*3/uL (ref 4.0–10.5)
nRBC: 0 % (ref 0.0–0.2)

## 2019-04-20 LAB — RENAL FUNCTION PANEL
Albumin: 3.8 g/dL (ref 3.5–5.0)
Anion gap: 12 (ref 5–15)
BUN: 51 mg/dL — ABNORMAL HIGH (ref 8–23)
CO2: 22 mmol/L (ref 22–32)
Calcium: 8.7 mg/dL — ABNORMAL LOW (ref 8.9–10.3)
Chloride: 105 mmol/L (ref 98–111)
Creatinine, Ser: 5 mg/dL — ABNORMAL HIGH (ref 0.61–1.24)
GFR calc Af Amer: 13 mL/min — ABNORMAL LOW (ref >60)
GFR calc non Af Amer: 11 mL/min — ABNORMAL LOW (ref >60)
Glucose, Bld: 82 mg/dL (ref 70–99)
Phosphorus: 4.4 mg/dL (ref 2.5–4.6)
Potassium: 3.9 mmol/L (ref 3.5–5.1)
Sodium: 139 mmol/L (ref 135–145)

## 2019-04-20 LAB — IRON AND TIBC
Iron: 106 ug/dL (ref 45–182)
Saturation Ratios: 40 % — ABNORMAL HIGH (ref 17.9–39.5)
TIBC: 268 ug/dL (ref 250–450)
UIBC: 162 ug/dL

## 2019-04-20 LAB — FERRITIN: Ferritin: 951 ng/mL — ABNORMAL HIGH (ref 24–336)

## 2019-04-20 LAB — POCT HEMOGLOBIN-HEMACUE: Hemoglobin: 12.4 g/dL — ABNORMAL LOW (ref 13.0–17.0)

## 2019-04-20 LAB — MAGNESIUM: Magnesium: 2.2 mg/dL (ref 1.7–2.4)

## 2019-04-20 MED ORDER — EPOETIN ALFA 20000 UNIT/ML IJ SOLN: 20000.0000 [IU] | Freq: Once | INTRAMUSCULAR | Status: DC

## 2019-05-01 ENCOUNTER — Encounter: Payer: Self-pay | Admitting: *Deleted

## 2019-05-01 ENCOUNTER — Ambulatory Visit
Admission: RE | Admit: 2019-05-01 | Discharge: 2019-05-01 | Disposition: A | Discharge status: Home / Self Care | Payer: Medicare Other | Source: Ambulatory Visit | Attending: Interventional Radiology | Admitting: Interventional Radiology

## 2019-05-01 DIAGNOSIS — N2889 Other specified disorders of kidney and ureter: Secondary | ICD-10-CM

## 2019-05-01 HISTORY — PX: IR RADIOLOGIST EVAL & MGMT: IMG5224

## 2019-05-01 NOTE — Progress Notes (Signed)
Chief Complaint: Follow-up of cystic lesion of the left kidney.  History of Present Illness: Steven Perez is a 68 y.o. male originally referred in 2015 for evaluation of a cystic lesion of the left kidney.  This was aspirated in 2016 with aspirated fluid demonstrating debris and blood and no evidence of malignancy.  Since that time, periodic MRI has demonstrated stable bilateral renal cysts.  Decision was made in 2018 to perform a renal ultrasound in 2 years rather than continue with MRI studies routinely.  Steven Perez states that he remains off of dialysis despite fairly longstanding severe chronic kidney disease.  He is on the inactive renal transplantation list at [facility].  He is asymptomatic.  Past Medical History:  Diagnosis Date  . Acute on chronic renal insufficiency   . Anemia    assoc w/ chronic reanl failure  . Arthralgia   . Chronic kidney disease    Stage 4  . Diabetes mellitus   . Dyslipidemia   . Erectile dysfunction due to arterial insufficiency   . HLD (hyperlipidemia)   . Hypertension   . Insomnia   . Left renal mass   . Obesity   . Secondary hyperparathyroidism of renal origin ([place])   . Type 2 diabetes mellitus ([place])     Past Surgical History:  Procedure Laterality Date  . AV FISTULA PLACEMENT Left 11/02/2016   Procedure: ARTERIOVENOUS (AV) FISTULA CREATION LEFT UPPER ARM;  Surgeon: Elam Dutch, MD;  Location: [place];  Service: Vascular;  Laterality: Left;  . IR GENERIC HISTORICAL  06/04/2014   IR RADIOLOGIST EVAL & MGMT 06/04/2014 Aletta Edouard, MD [facility] INTERV RAD  . IR GENERIC HISTORICAL  01/15/2016   IR RADIOLOGIST EVAL & MGMT 01/15/2016 [facility] INTERV RAD  . IR RADIOLOGIST EVAL & MGMT  01/25/2017  . IR RADIOLOGIST EVAL & MGMT  05/01/2019  . RENAL BIOPSY    . RENAL BIOPSY, PERCUTANEOUS    . TONSILLECTOMY      Allergies: Contrast media [iodinated diagnostic agents] and Tetanus toxoids  Medications: Prior to Admission medications   Medication  Sig Start Date End Date Taking? Authorizing Provider  carvedilol (COREG) 12.5 MG tablet Take 1 tablet (12.5 mg total) by mouth 2 (two) times daily. 01/01/19 04/01/19  Strader, Fransisco Hertz, PA-C  doxazosin (CARDURA) 4 MG tablet Take 2 mg by mouth at bedtime.     [provider]  epoetin alfa (EPOGEN,PROCRIT) 40814 UNIT/ML injection 10,000 Units every 30 (thirty) days.    Elmarie Shiley, MD  epoetin alfa (EPOGEN,PROCRIT) 48185 UNIT/ML injection 20,000 Units every 30 (thirty) days.    [provider]  furosemide (LASIX) 40 MG tablet Take 2 tablets (80 mg total) by mouth daily. 12/08/18 01/07/19  Manuella Ghazi, Pratik D, DO  hydrALAZINE (APRESOLINE) 25 MG tablet Take 25 mg by mouth 2 (two) times daily.    [provider]  Insulin Degludec (TRESIBA FLEXTOUCH) 200 UNIT/ML SOPN Inject 20 Units into the skin at bedtime.    [provider]  isosorbide mononitrate (IMDUR) 30 MG 24 hr tablet Take 0.5 tablets (15 mg total) by mouth daily. 12/07/18 01/06/19  Manuella Ghazi, Pratik D, DO  polyethylene glycol (MIRALAX / GLYCOLAX) 17 g packet Take 17 g by mouth daily. 12/07/18   Manuella Ghazi, Pratik D, DO  potassium chloride SA (K-DUR) 20 MEQ tablet Take 1 tablet (20 mEq total) by mouth daily. 12/08/18 01/07/19  Manuella Ghazi, Pratik D, DO  pravastatin (PRAVACHOL) 20 MG tablet Take 20 mg by mouth at bedtime.  [provider]  traZODone (DESYREL) 100 MG tablet Take 100 mg by mouth at bedtime.    [provider]  Vitamin D, Ergocalciferol, (DRISDOL) 50000 UNITS CAPS capsule Take 50,000 Units by mouth every Wednesday.     [provider]     Family History  Problem Relation Age of Onset  . Diabetes Mellitus II Mother   . CAD Neg Hx   . Stroke Neg Hx     Social History   Socioeconomic History  . Marital status: Married    Spouse name: Not on file  . Number of children: Not on file  . Years of education: Not on file  . Highest education level: Not on file  Occupational History  . Not on file   Social Needs  . Financial resource strain: Not on file  . Food insecurity    Worry: Not on file    Inability: Not on file  . Transportation needs    Medical: Not on file    Non-medical: Not on file  Tobacco Use  . Smoking status: Former Research scientist (life sciences)  . Smokeless tobacco: Never Used  Substance and Sexual Activity  . Alcohol use: No    Comment: occasionally  . Drug use: No  . Sexual activity: Yes  Lifestyle  . Physical activity    Days per week: Not on file    Minutes per session: Not on file  . Stress: Not on file  Relationships  . Social Herbalist on phone: Not on file    Gets together: Not on file    Attends religious service: Not on file    Active member of club or organization: Not on file    Attends meetings of clubs or organizations: Not on file    Relationship status: Not on file  Other Topics Concern  . Not on file  Social History Narrative  . Not on file    Review of Systems: A 12 point ROS discussed and pertinent positives are indicated in the HPI above.  All other systems are negative.  Review of Systems  Constitutional: Negative.   Respiratory: Negative.   Cardiovascular: Negative.   Gastrointestinal: Negative.   Genitourinary: Negative.   Musculoskeletal: Negative.   Neurological: Negative.     Vital Signs: BP (!) 197/73 (BP Location: Right Arm)   Pulse 66   Temp 98 F (36.7 C) (Oral)   SpO2 98%   Physical Exam Vitals signs reviewed.  Constitutional:      General: He is not in acute distress.    Appearance: Normal appearance. He is not ill-appearing, toxic-appearing or diaphoretic.  Musculoskeletal:        General: No swelling.  Skin:    General: Skin is warm and dry.  Neurological:     General: No focal deficit present.     Mental Status: He is alert and oriented to person, place, and time.     MImaging: Ir Radiologist Eval & Mgmt  Result Date: 05/01/2019 Please refer to notes tab for details about interventional procedure.  (Op Note)   Labs:  CBC: Recent Labs    01/26/19 0711  02/23/19 0709  03/23/19 0715 03/23/19 0724 04/20/19 0824 04/20/19 0830  WBC 10.9*  --  8.2  --  8.0  --  8.4  --   HGB 12.8*   < > 13.1   < > 12.8* 12.5* 12.4* 12.4*  HCT 39.4  --  40.8  --  39.6  --  38.2*  --  PLT 284  --  261  --  264  --  258  --    < > = values in this interval not displayed.    COAGS: No results for input(s): INR, APTT in the last 8760 hours.  BMP: Recent Labs    01/26/19 0711 02/23/19 0709 03/23/19 0715 04/20/19 0824  NA 138 138 138 139  K 4.1 3.7 4.2 3.9  CL 103 103 105 105  CO2 26 24 23 22   GLUCOSE 112* 93 89 82  BUN 43* 46* 49* 51*  CALCIUM 9.0 9.0 8.9  9.4 8.7*  CREATININE 4.48* 4.58* 4.69* 5.00*  GFRNONAA 13* 12* 12* 11*  GFRAA 15* 14* 14* 13*    LIVER FUNCTION TESTS: Recent Labs    12/01/18 1330  01/26/19 0711 02/23/19 0709 03/23/19 0715 04/20/19 0824  BILITOT 0.8  --   --   --   --   --   AST 15  --   --   --   --   --   ALT 15  --   --   --   --   --   ALKPHOS 42  --   --   --   --   --   PROT 8.1  --   --   --   --   --   ALBUMIN 4.3   < > 3.8 4.0 4.1 3.8   < > = values in this interval not displayed.    Assessment and Plan:  Renal ultrasound was performed earlier today.  I reviewed images with Mr. Lasser.  The renal ultrasound demonstrates evidence of increased cortical echogenicity bilaterally consistent with chronic kidney disease.  There is no evidence of hydronephrosis.  Bilateral renal cysts are present with one cluster of cysts in the lower left kidney demonstrating some thin internal septations.  Overall quantity and size of cysts appears to have slowly increased since 2015.  The cysts by ultrasound appear likely benign with no solid components.  No definite solid masses are identified by ultrasound.  I did not recommend any further routine imaging of the kidneys and particularly the previously aspirated region of the left kidney.  This now appears to  represent a cluster of cysts.  I told him that renal ultrasound could certainly be performed again if he were to develop hematuria or any new symptoms.  He will continue to follow-up with Dr. Posey Pronto and the transplant service at [facility].   Electronically Signed: Azzie Roup 05/01/2019, 2:29 PM     I spent a total of 10 Minutes in face to face in clinical consultation, greater than 50% of which was counseling/coordinating care for left renal cystic lesion.

## 2019-05-18 ENCOUNTER — Encounter (HOSPITAL_COMMUNITY): Payer: Medicare Other

## 2019-05-18 ENCOUNTER — Other Ambulatory Visit (HOSPITAL_COMMUNITY): Payer: Medicare Other

## 2019-05-22 ENCOUNTER — Encounter: Payer: Self-pay | Admitting: Urology

## 2019-06-04 DIAGNOSIS — N185 Chronic kidney disease, stage 5: Secondary | ICD-10-CM | POA: Diagnosis not present

## 2019-06-15 DIAGNOSIS — N2581 Secondary hyperparathyroidism of renal origin: Secondary | ICD-10-CM | POA: Diagnosis not present

## 2019-06-15 DIAGNOSIS — I120 Hypertensive chronic kidney disease with stage 5 chronic kidney disease or end stage renal disease: Secondary | ICD-10-CM | POA: Diagnosis not present

## 2019-06-15 DIAGNOSIS — N185 Chronic kidney disease, stage 5: Secondary | ICD-10-CM | POA: Diagnosis not present

## 2019-06-15 DIAGNOSIS — D631 Anemia in chronic kidney disease: Secondary | ICD-10-CM | POA: Diagnosis not present

## 2019-07-01 IMAGING — MR MR ABDOMEN W/O CM
6 of 9 series · 21 of 48 positions shown · non-contrast
Comparison: Multiple exams, including 01/15/2016

CLINICAL DATA: Left renal cystic mass aspiration on 12/19/2014.
End-stage renal disease.

EXAM:
MRI ABDOMEN WITHOUT CONTRAST
TECHNIQUE: Multiplanar multisequence MR imaging was performed without the
administration of intravenous contrast.

[Series 3: cor ssfse · coronal · 6.0mm · 0.94mm/px · 2 of 43 slices shown]
[im 1/43]
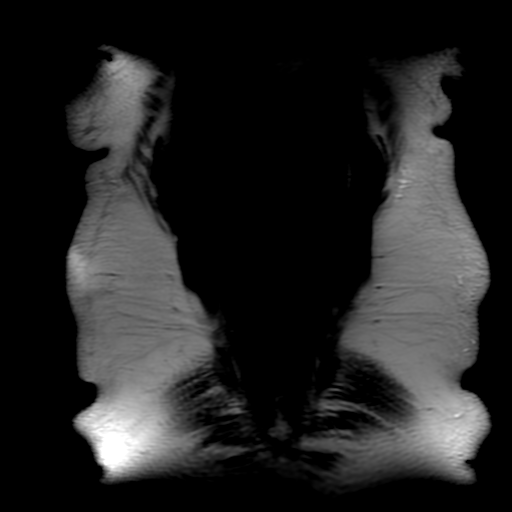
[im 43/43]
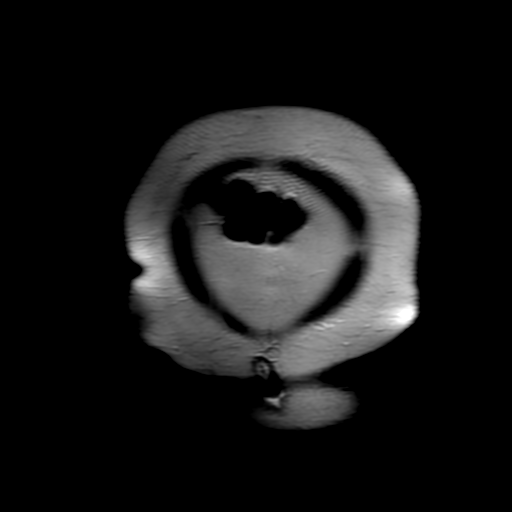

[Series 5: ax ssfse · axial · 6.0mm · 0.90mm/px · z∈[-54,+179]mm · 3 of 40 slices shown]
[im 1/40]
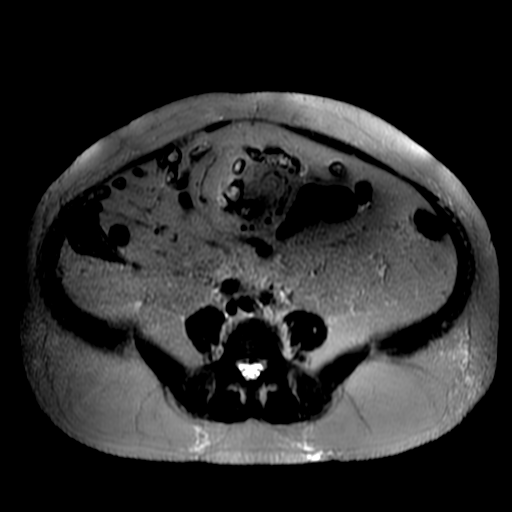
[im 20/40]
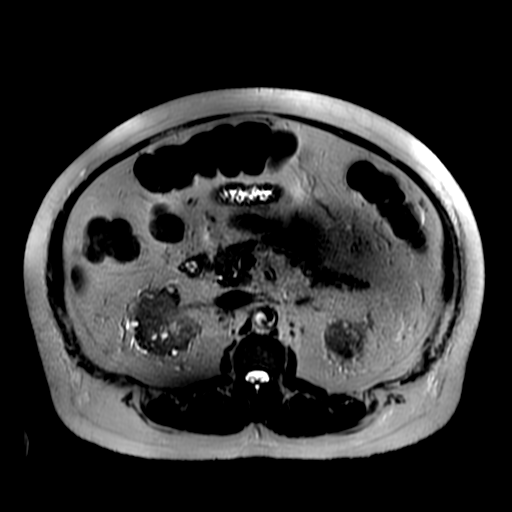
[im 40/40]
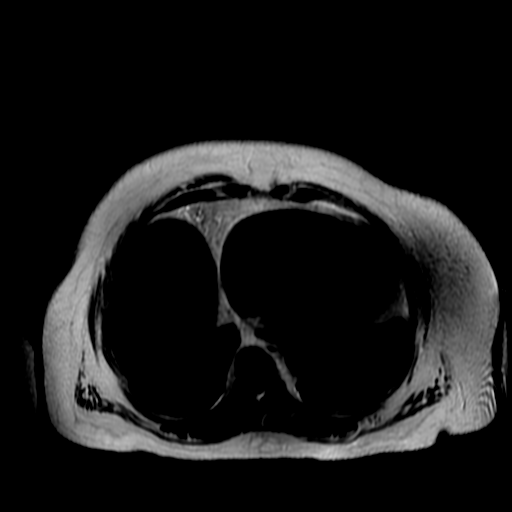

[Series 7: cor 3mm frfse · coronal · 3.0mm · 0.94mm/px · 3 of 47 slices shown]
[im 1/47]
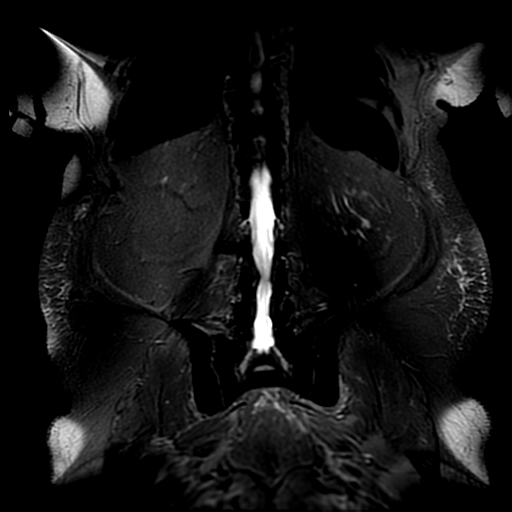
[im 24/47]
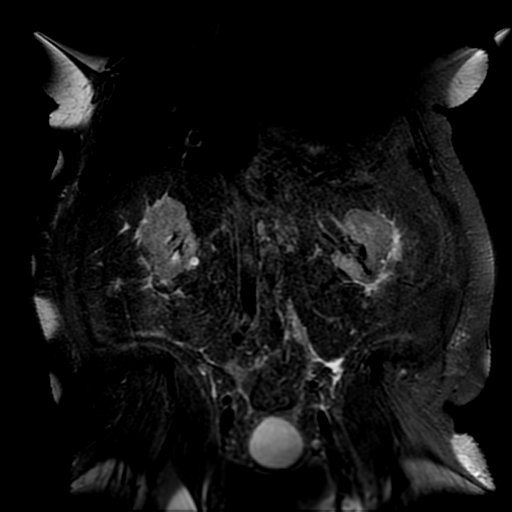
[im 47/47]
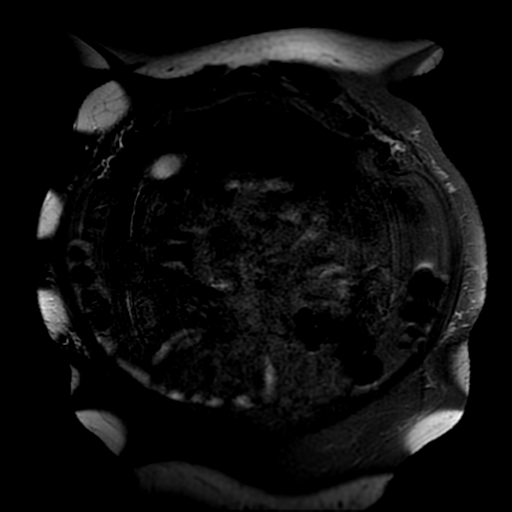

[Series 8: T2 fat-sat · axial · 6.0mm · 0.90mm/px · z∈[-54,+179]mm · 3 of 40 slices shown]
[im 1/40]
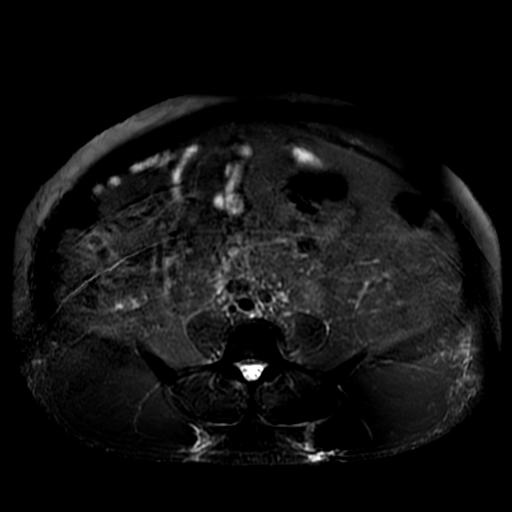
[im 20/40]
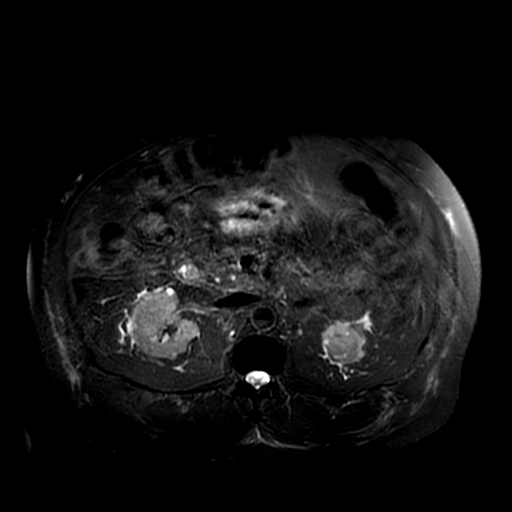
[im 40/40]
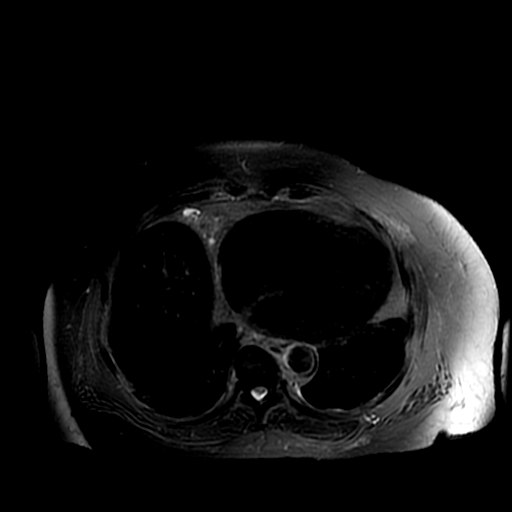

[Series 9: DWI b500 · axial · 8.0mm · 1.95mm/px · z∈[-51,+219]mm · 4 of 56 slices shown]
[im 1/56]
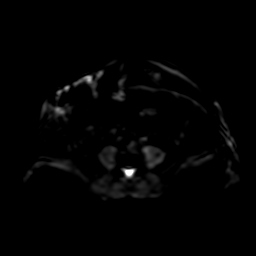
[im 19/56]
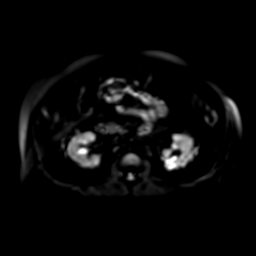
[im 37/56]
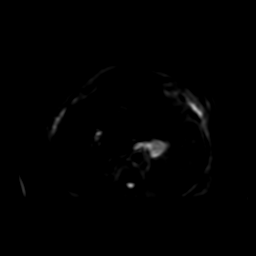
[im 56/56]
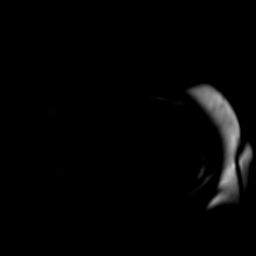

[Series 11: T1 dynamic · axial · 3.0mm · 0.90mm/px · z∈[-52,+104]mm · 6 of 140 slices shown]
[im 1/140]
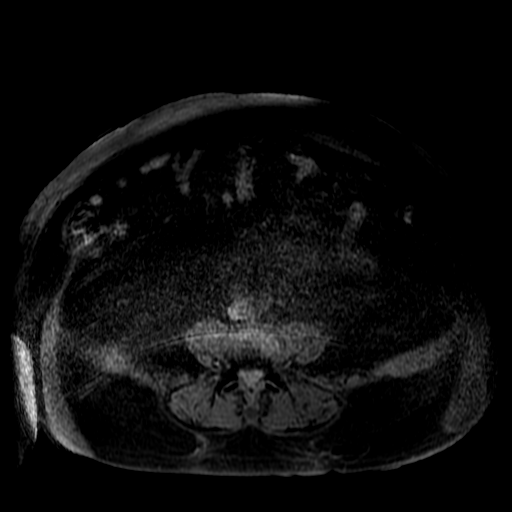
[im 18/140]
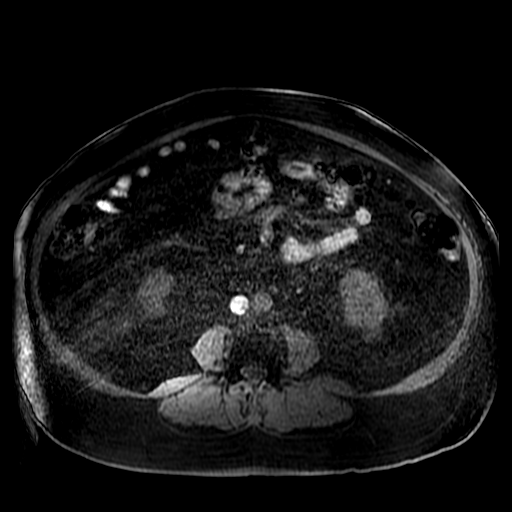
[im 35/140]
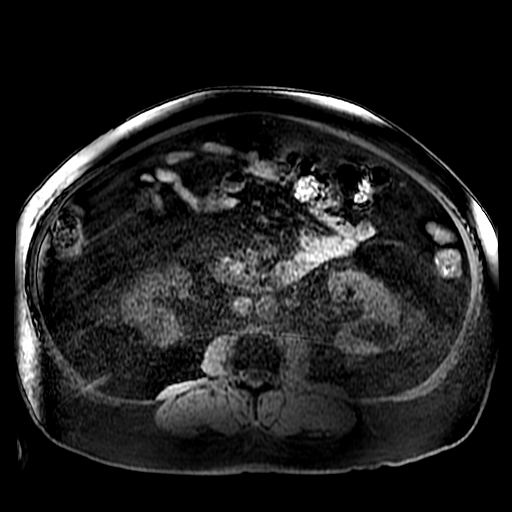
[im 53/140]
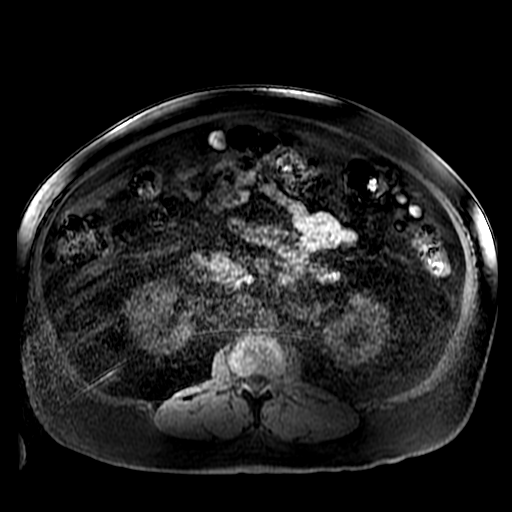
[im 87/140]
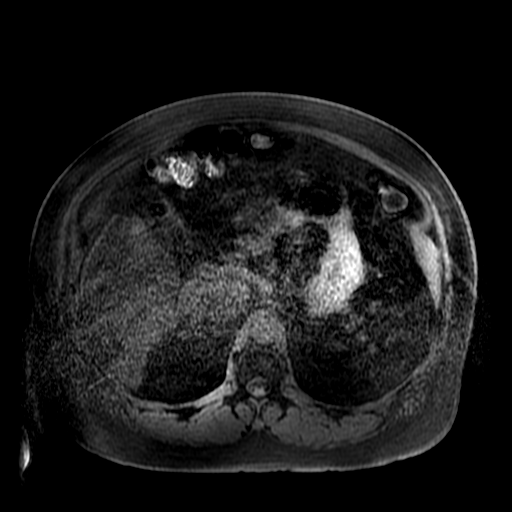
[im 105/140]
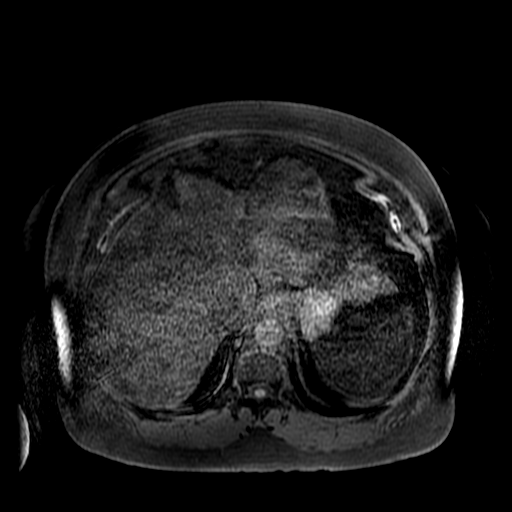

[21 of 48 positions shown; findings below may reference images not displayed]

FINDINGS: Lower chest: Unremarkable where included.

Hepatobiliary: Low signal in the liver on in-phase images which can
indicate hemochromatosis. There is also low signal in the spleen,
and low T2 signal throughout the liver and spleen which can also be
seen in hemochromatosis.

Several small T2 hyperintense lesions scattered in the liver, for
example a 0.9 by 0.7 cm lesion in segment 2 on image [DATE] which
previously measured the same. Some of these lesions are more
cephalad in location than was included on the prior exam. Subject to
the limits of motion artifact, these lesions appear stable from
prior. These lesions are poorly seen on the T1 weighted images.

Pancreas:  Unremarkable

Spleen:  Low T2 signal intensity favoring hemochromatosis.

Adrenals/Urinary Tract:  Adrenal glands normal.

The left mid kidney renal lesion of concern measures 2.3 by 2.3 cm
on image [DATE] (formerly by my measurements of the same). And
contains multiple internal septations some of which may be slightly
thickened for example on image [DATE]. This lesion has primarily high
T2 and low T1 signal characteristics. Overall very similar
appearance compared to 01/15/2016.

Other fluid signal intensity lesions of both kidneys are likely
cysts and similar to prior.

Stomach/Bowel: Unremarkable

Vascular/Lymphatic:  Unremarkable

Other:  No supplemental non-categorized findings.

Musculoskeletal: Hemangioma in the T11 vertebra. Thoracic and lumbar
spondylosis along with degenerative disc disease.
IMPRESSION: 1. Stable appearance of 2.3 cm cystic lesion of the left mid kidney
posteriorly, with multiple internal septations some of which may be
slightly thickened. No significant change from prior. Based on its
characteristics this is at least a Bosniak category 2F lesion. No
obvious morphologic change compared to prior. An actual Bosniak
classification cannot be assigned due to the lack of postcontrast
imaging. Annual surveillance is likely reasonable given the lack of
significant progression or morphologic change over the past year.
2. Hemochromatosis.
3. Stable small nonspecific T2 signal hyperintensities in the liver,
no appreciable change from last year, likely benign but can be
surveilled at the time of renal follow up.

## 2019-07-31 DIAGNOSIS — N189 Chronic kidney disease, unspecified: Secondary | ICD-10-CM | POA: Diagnosis not present

## 2019-07-31 DIAGNOSIS — N185 Chronic kidney disease, stage 5: Secondary | ICD-10-CM | POA: Diagnosis not present

## 2019-07-31 DIAGNOSIS — N2581 Secondary hyperparathyroidism of renal origin: Secondary | ICD-10-CM | POA: Diagnosis not present

## 2019-08-13 DIAGNOSIS — I120 Hypertensive chronic kidney disease with stage 5 chronic kidney disease or end stage renal disease: Secondary | ICD-10-CM | POA: Diagnosis not present

## 2019-08-13 DIAGNOSIS — D631 Anemia in chronic kidney disease: Secondary | ICD-10-CM | POA: Diagnosis not present

## 2019-08-13 DIAGNOSIS — N185 Chronic kidney disease, stage 5: Secondary | ICD-10-CM | POA: Diagnosis not present

## 2019-08-13 DIAGNOSIS — N2581 Secondary hyperparathyroidism of renal origin: Secondary | ICD-10-CM | POA: Diagnosis not present

## 2019-09-24 DIAGNOSIS — E1129 Type 2 diabetes mellitus with other diabetic kidney complication: Secondary | ICD-10-CM | POA: Diagnosis not present

## 2019-09-24 DIAGNOSIS — Z1389 Encounter for screening for other disorder: Secondary | ICD-10-CM | POA: Diagnosis not present

## 2019-09-24 DIAGNOSIS — Z0001 Encounter for general adult medical examination with abnormal findings: Secondary | ICD-10-CM | POA: Diagnosis not present

## 2019-09-24 DIAGNOSIS — N185 Chronic kidney disease, stage 5: Secondary | ICD-10-CM | POA: Diagnosis not present

## 2019-09-24 DIAGNOSIS — E119 Type 2 diabetes mellitus without complications: Secondary | ICD-10-CM | POA: Diagnosis not present

## 2019-09-24 DIAGNOSIS — I10 Essential (primary) hypertension: Secondary | ICD-10-CM | POA: Diagnosis not present

## 2019-09-24 DIAGNOSIS — Z6839 Body mass index (BMI) 39.0-39.9, adult: Secondary | ICD-10-CM | POA: Diagnosis not present

## 2019-10-03 DIAGNOSIS — N185 Chronic kidney disease, stage 5: Secondary | ICD-10-CM | POA: Diagnosis not present

## 2019-10-17 IMAGING — DX DG HIP (WITH OR WITHOUT PELVIS) 2-3V*R*
3 series · 3 of 3 positions shown · non-contrast
Comparison: None.

CLINICAL DATA: Right hip pain for approximately 10 days. No known
injury.

EXAM:
DG HIP (WITH OR WITHOUT PELVIS) 2-3V RIGHT

[pelvis ap]
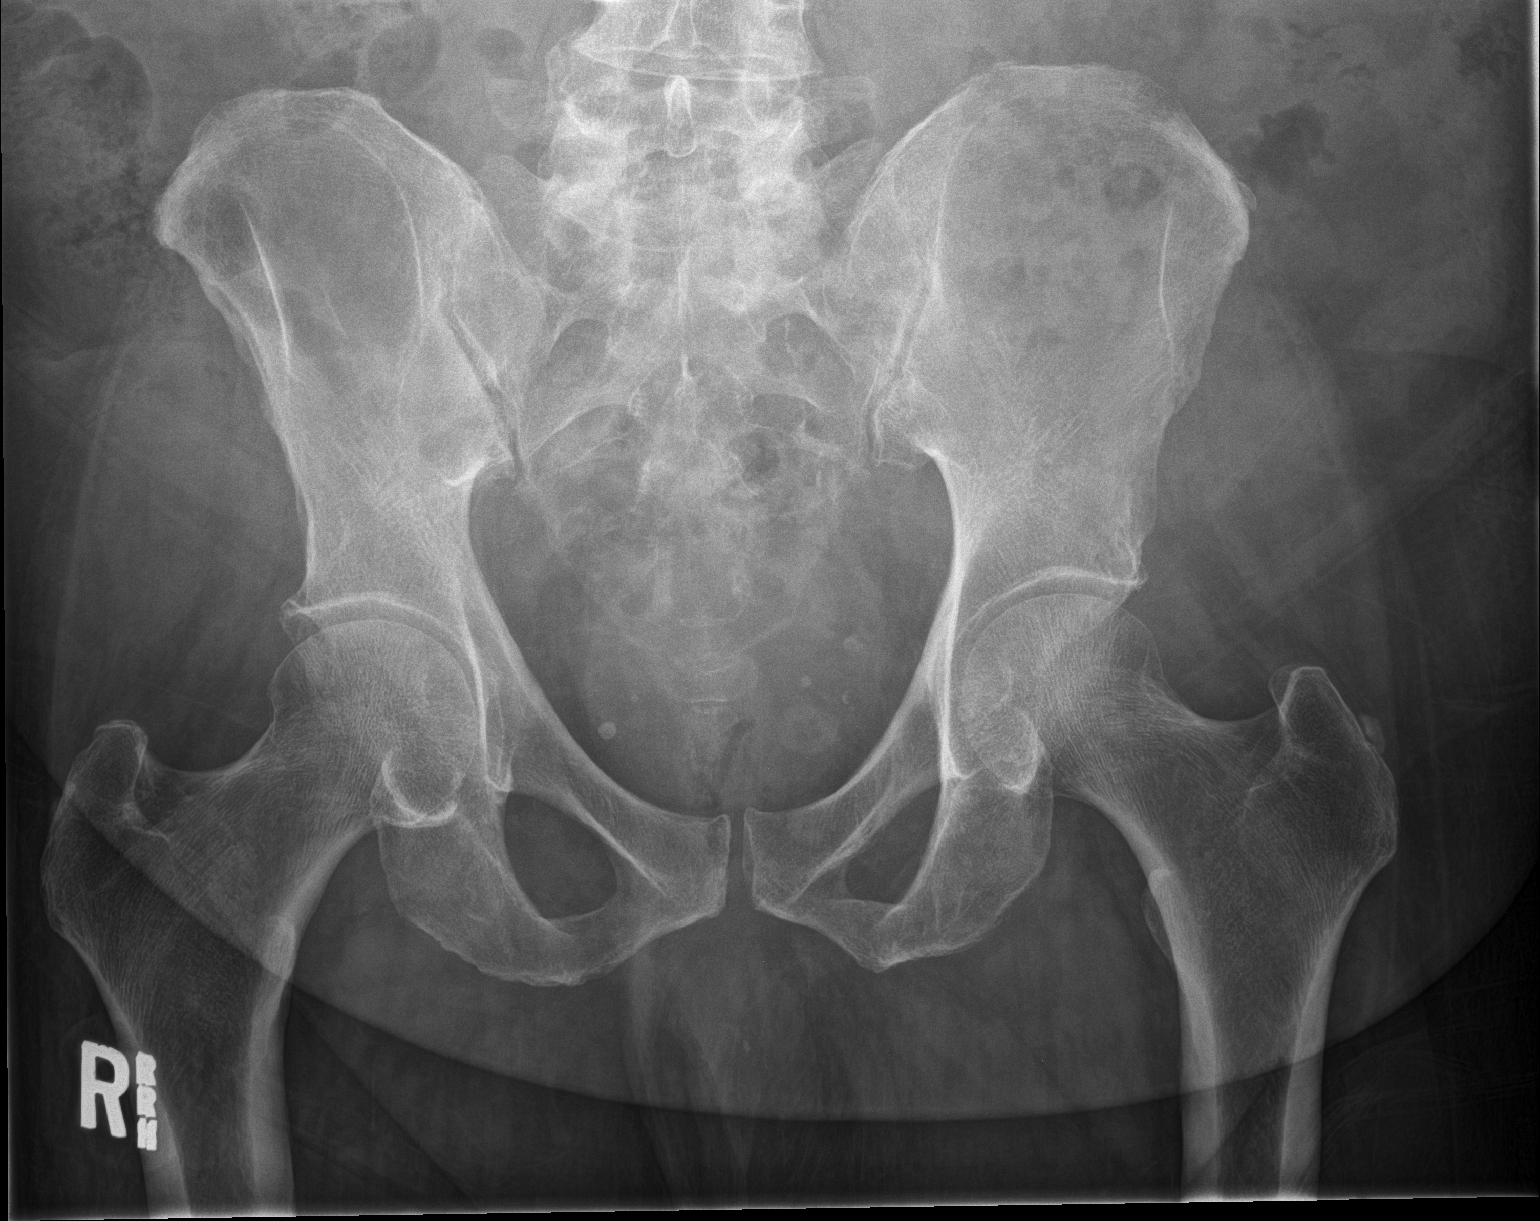

[hip ap]
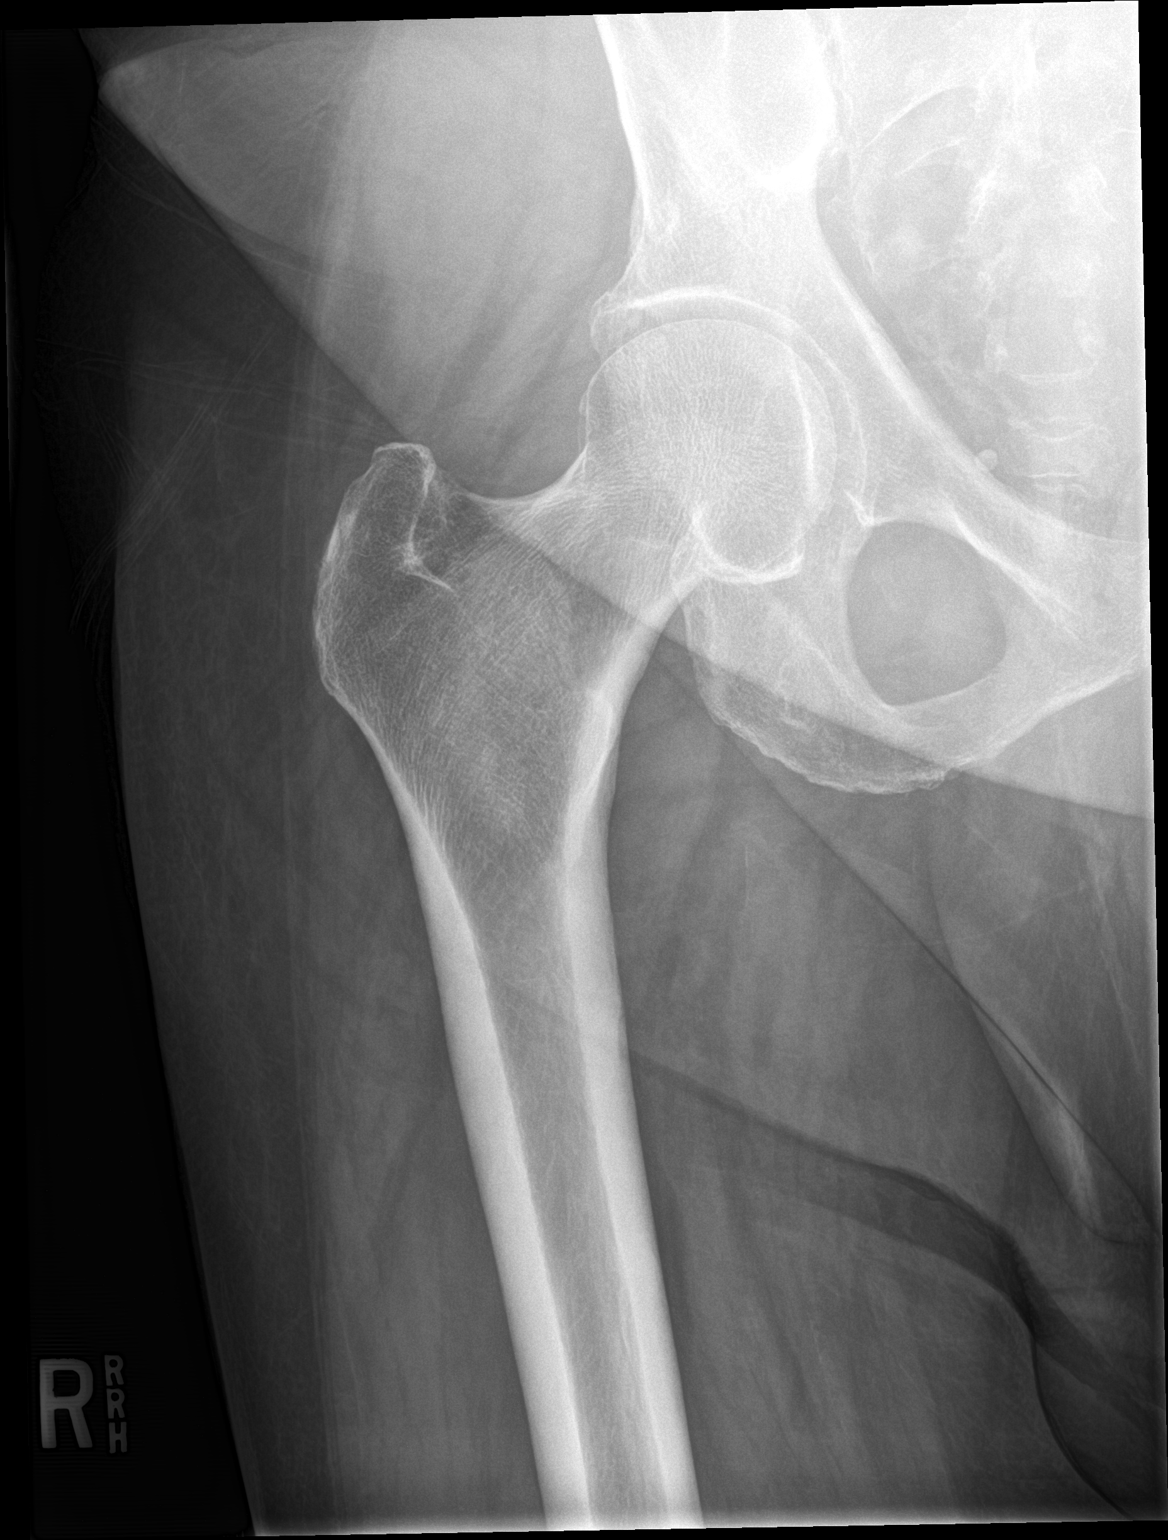

[hip lat]
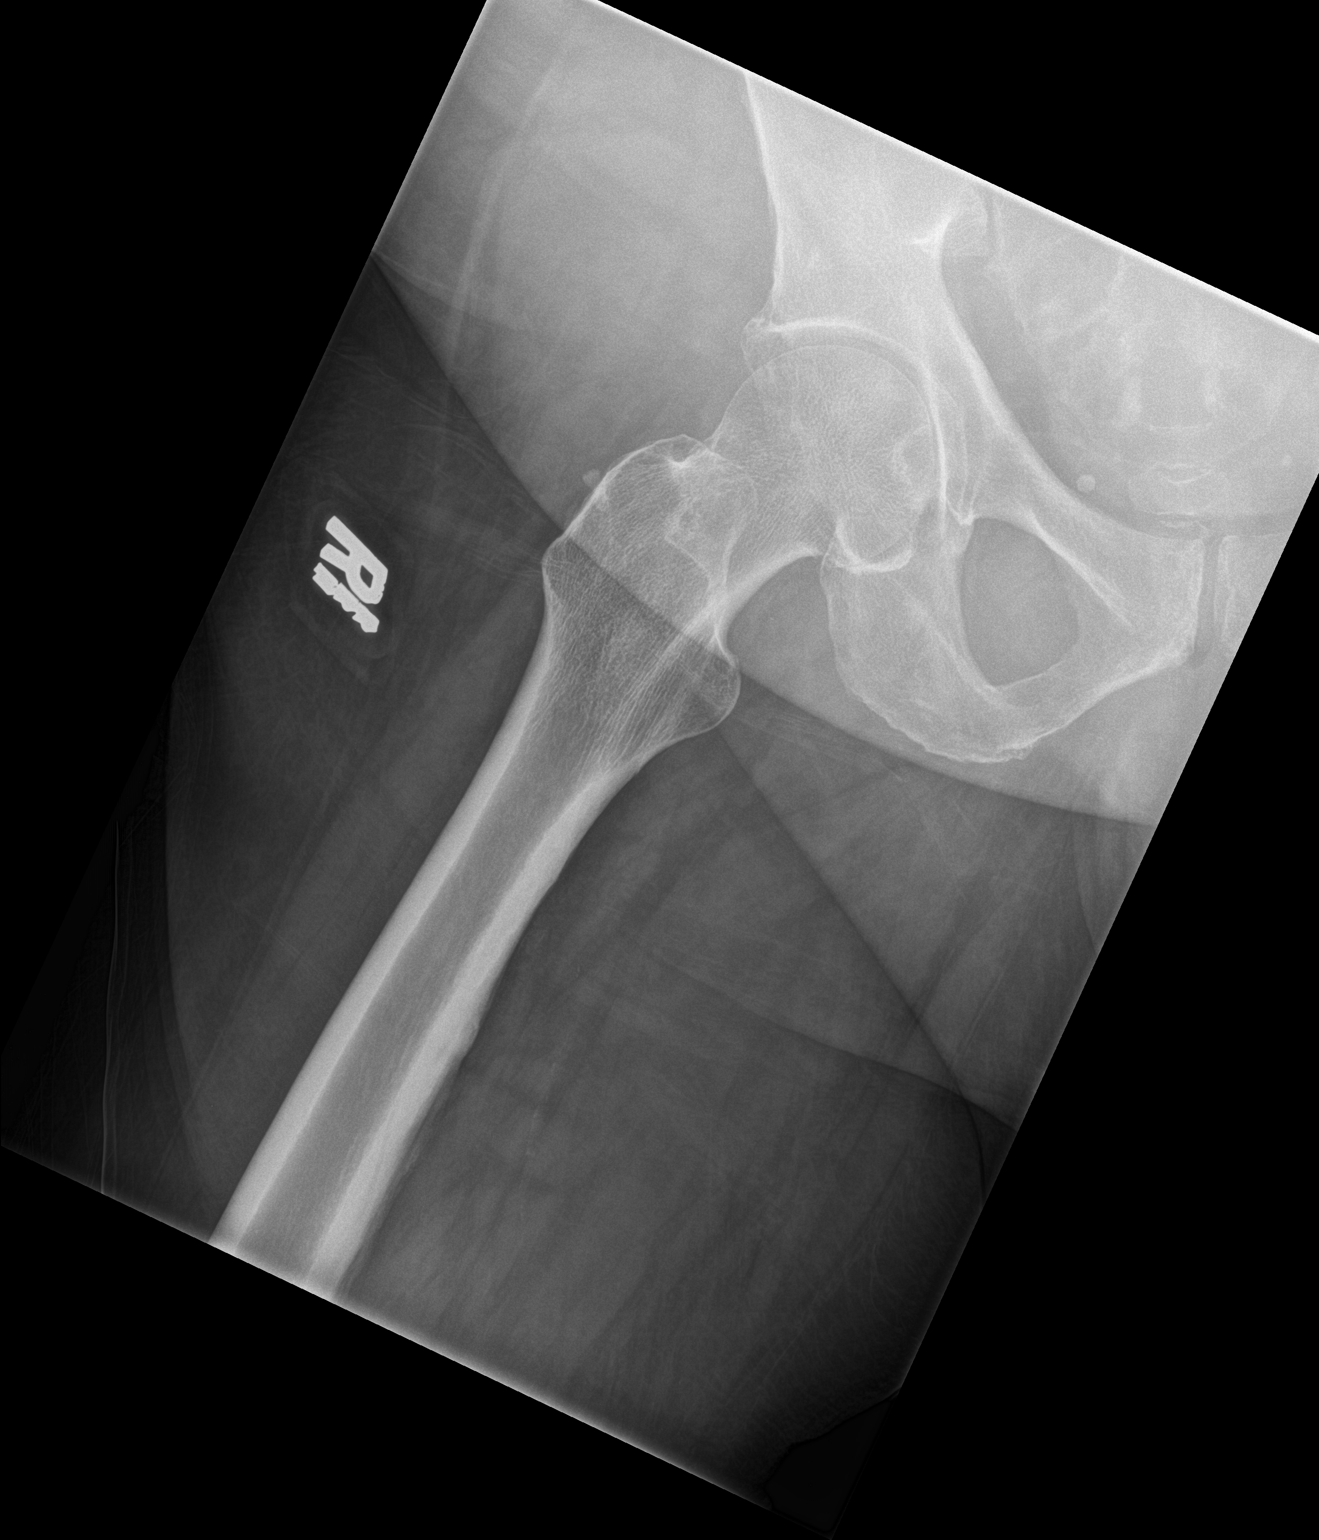

[3 of 3 positions shown; findings below may reference images not displayed]

FINDINGS: There is no evidence of hip fracture or dislocation. There is no
evidence of arthropathy or other focal bone abnormality.
IMPRESSION: Negative exam.

## 2019-10-24 DIAGNOSIS — E1122 Type 2 diabetes mellitus with diabetic chronic kidney disease: Secondary | ICD-10-CM | POA: Diagnosis not present

## 2019-10-24 DIAGNOSIS — I120 Hypertensive chronic kidney disease with stage 5 chronic kidney disease or end stage renal disease: Secondary | ICD-10-CM | POA: Diagnosis not present

## 2019-10-24 DIAGNOSIS — N185 Chronic kidney disease, stage 5: Secondary | ICD-10-CM | POA: Diagnosis not present

## 2019-10-24 DIAGNOSIS — N2581 Secondary hyperparathyroidism of renal origin: Secondary | ICD-10-CM | POA: Diagnosis not present

## 2019-10-24 DIAGNOSIS — D631 Anemia in chronic kidney disease: Secondary | ICD-10-CM | POA: Diagnosis not present

## 2019-12-04 DIAGNOSIS — N185 Chronic kidney disease, stage 5: Secondary | ICD-10-CM | POA: Diagnosis not present

## 2019-12-11 DIAGNOSIS — N2581 Secondary hyperparathyroidism of renal origin: Secondary | ICD-10-CM | POA: Diagnosis not present

## 2019-12-11 DIAGNOSIS — E1122 Type 2 diabetes mellitus with diabetic chronic kidney disease: Secondary | ICD-10-CM | POA: Diagnosis not present

## 2019-12-11 DIAGNOSIS — D631 Anemia in chronic kidney disease: Secondary | ICD-10-CM | POA: Diagnosis not present

## 2019-12-11 DIAGNOSIS — N185 Chronic kidney disease, stage 5: Secondary | ICD-10-CM | POA: Diagnosis not present

## 2019-12-11 DIAGNOSIS — I120 Hypertensive chronic kidney disease with stage 5 chronic kidney disease or end stage renal disease: Secondary | ICD-10-CM | POA: Diagnosis not present

## 2020-01-07 ENCOUNTER — Other Ambulatory Visit: Payer: Self-pay | Admitting: Student

## 2020-01-07 NOTE — Telephone Encounter (Signed)
This is a [place] pt.  °

## 2020-01-30 DIAGNOSIS — N185 Chronic kidney disease, stage 5: Secondary | ICD-10-CM | POA: Diagnosis not present

## 2020-02-05 DIAGNOSIS — E1122 Type 2 diabetes mellitus with diabetic chronic kidney disease: Secondary | ICD-10-CM | POA: Diagnosis not present

## 2020-02-05 DIAGNOSIS — D631 Anemia in chronic kidney disease: Secondary | ICD-10-CM | POA: Diagnosis not present

## 2020-02-05 DIAGNOSIS — Z23 Encounter for immunization: Secondary | ICD-10-CM | POA: Diagnosis not present

## 2020-02-05 DIAGNOSIS — N2581 Secondary hyperparathyroidism of renal origin: Secondary | ICD-10-CM | POA: Diagnosis not present

## 2020-02-05 DIAGNOSIS — N185 Chronic kidney disease, stage 5: Secondary | ICD-10-CM | POA: Diagnosis not present

## 2020-02-05 DIAGNOSIS — I120 Hypertensive chronic kidney disease with stage 5 chronic kidney disease or end stage renal disease: Secondary | ICD-10-CM | POA: Diagnosis not present

## 2020-03-27 DIAGNOSIS — E7849 Other hyperlipidemia: Secondary | ICD-10-CM | POA: Diagnosis not present

## 2020-03-27 DIAGNOSIS — I10 Essential (primary) hypertension: Secondary | ICD-10-CM | POA: Diagnosis not present

## 2020-03-27 DIAGNOSIS — M2141 Flat foot [pes planus] (acquired), right foot: Secondary | ICD-10-CM | POA: Diagnosis not present

## 2020-03-27 DIAGNOSIS — E1129 Type 2 diabetes mellitus with other diabetic kidney complication: Secondary | ICD-10-CM | POA: Diagnosis not present

## 2020-03-27 DIAGNOSIS — Z6834 Body mass index (BMI) 34.0-34.9, adult: Secondary | ICD-10-CM | POA: Diagnosis not present

## 2020-03-31 DIAGNOSIS — N185 Chronic kidney disease, stage 5: Secondary | ICD-10-CM | POA: Diagnosis not present

## 2020-04-07 DIAGNOSIS — D631 Anemia in chronic kidney disease: Secondary | ICD-10-CM | POA: Diagnosis not present

## 2020-04-07 DIAGNOSIS — N185 Chronic kidney disease, stage 5: Secondary | ICD-10-CM | POA: Diagnosis not present

## 2020-04-07 DIAGNOSIS — E1122 Type 2 diabetes mellitus with diabetic chronic kidney disease: Secondary | ICD-10-CM | POA: Diagnosis not present

## 2020-04-07 DIAGNOSIS — I120 Hypertensive chronic kidney disease with stage 5 chronic kidney disease or end stage renal disease: Secondary | ICD-10-CM | POA: Diagnosis not present

## 2020-04-07 DIAGNOSIS — N2581 Secondary hyperparathyroidism of renal origin: Secondary | ICD-10-CM | POA: Diagnosis not present

## 2020-04-16 DIAGNOSIS — H35032 Hypertensive retinopathy, left eye: Secondary | ICD-10-CM | POA: Diagnosis not present

## 2020-05-28 DIAGNOSIS — N185 Chronic kidney disease, stage 5: Secondary | ICD-10-CM | POA: Diagnosis not present

## 2020-06-04 DIAGNOSIS — I120 Hypertensive chronic kidney disease with stage 5 chronic kidney disease or end stage renal disease: Secondary | ICD-10-CM | POA: Diagnosis not present

## 2020-06-04 DIAGNOSIS — N2581 Secondary hyperparathyroidism of renal origin: Secondary | ICD-10-CM | POA: Diagnosis not present

## 2020-06-04 DIAGNOSIS — M62830 Muscle spasm of back: Secondary | ICD-10-CM | POA: Diagnosis not present

## 2020-06-04 DIAGNOSIS — N185 Chronic kidney disease, stage 5: Secondary | ICD-10-CM | POA: Diagnosis not present

## 2020-06-04 DIAGNOSIS — E1122 Type 2 diabetes mellitus with diabetic chronic kidney disease: Secondary | ICD-10-CM | POA: Diagnosis not present

## 2020-06-04 DIAGNOSIS — D631 Anemia in chronic kidney disease: Secondary | ICD-10-CM | POA: Diagnosis not present

## 2020-07-21 DIAGNOSIS — N185 Chronic kidney disease, stage 5: Secondary | ICD-10-CM | POA: Diagnosis not present

## 2020-08-01 DIAGNOSIS — N2581 Secondary hyperparathyroidism of renal origin: Secondary | ICD-10-CM | POA: Diagnosis not present

## 2020-08-01 DIAGNOSIS — E1122 Type 2 diabetes mellitus with diabetic chronic kidney disease: Secondary | ICD-10-CM | POA: Diagnosis not present

## 2020-08-01 DIAGNOSIS — N185 Chronic kidney disease, stage 5: Secondary | ICD-10-CM | POA: Diagnosis not present

## 2020-08-01 DIAGNOSIS — I120 Hypertensive chronic kidney disease with stage 5 chronic kidney disease or end stage renal disease: Secondary | ICD-10-CM | POA: Diagnosis not present

## 2020-08-01 DIAGNOSIS — D631 Anemia in chronic kidney disease: Secondary | ICD-10-CM | POA: Diagnosis not present

## 2020-08-12 NOTE — Progress Notes (Signed)
Cardiology Office Note    Date:  08/18/2020   ID:  Steven Perez, DOB [birth year], MRN [id number]   PCP:  [facility], [place]  Cardiologist:  Carlyle Dolly, MD  Advanced Practice Provider:  No care team member to display Electrophysiologist:  None   [phone]   Chief Complaint  Patient presents with  . Follow-up    History of Present Illness:  Steven Perez is a 70 y.o. male with history of hypertension, HLD, DM type II, secondary hyperparathyroidism, CKD, presumed nonischemic cardiomyopathy ejection fraction 40% in 2019 to be [place], 25 to 30% 11/2018.  Because of his CKD cardiac catheterization was not pursued.  He had a normal NST 04/2018.  Patient last had a telemedicine visit with our office 12/27/2018.  He was started back on Coreg which was inadvertently stopped and supposed to come in for further titration.  Verapamil was stopped given his cardiomyopathy.   Patient comes in for f/u. Denies chest pain, palpitations, dizziness or presyncope. Has fatigue and chronic DOE he relates to CKD stage 5. Very sedentary.    Past Medical History:  Diagnosis Date  . Acute on chronic renal insufficiency   . Anemia    assoc w/ chronic reanl failure  . Arthralgia   . Chronic kidney disease    Stage 4  . Diabetes mellitus   . Dyslipidemia   . Erectile dysfunction due to arterial insufficiency   . HLD (hyperlipidemia)   . Hypertension   . Insomnia   . Left renal mass   . Obesity   . Secondary hyperparathyroidism of renal origin ([place])   . Type 2 diabetes mellitus ([place])     Past Surgical History:  Procedure Laterality Date  . AV FISTULA PLACEMENT Left 11/02/2016   Procedure: ARTERIOVENOUS (AV) FISTULA CREATION LEFT UPPER ARM;  Surgeon: Elam Dutch, MD;  Location: [facility];  Service: Vascular;  Laterality: Left;  . IR GENERIC HISTORICAL  06/04/2014   IR RADIOLOGIST EVAL & MGMT 06/04/2014 Aletta Edouard, MD [facility] INTERV RAD  . IR GENERIC  HISTORICAL  01/15/2016   IR RADIOLOGIST EVAL & MGMT 01/15/2016 [facility] INTERV RAD  . IR RADIOLOGIST EVAL & MGMT  01/25/2017  . IR RADIOLOGIST EVAL & MGMT  05/01/2019  . RENAL BIOPSY    . RENAL BIOPSY, PERCUTANEOUS    . TONSILLECTOMY      Current Medications: Current Meds  Medication Sig  . carvedilol (COREG) 12.5 MG tablet TAKE 1 TABLET(12.5 MG) BY MOUTH TWICE DAILY  . doxazosin (CARDURA) 4 MG tablet Take 4 mg by mouth at bedtime.  . furosemide (LASIX) 40 MG tablet Take 40 mg by mouth 3 (three) times daily.  . hydrALAZINE (APRESOLINE) 25 MG tablet Take 25 mg by mouth 2 (two) times daily.  . insulin degludec (TRESIBA) 200 UNIT/ML FlexTouch Pen Inject 20 Units into the skin at bedtime.  . potassium chloride SA (K-DUR) 20 MEQ tablet Take 1 tablet (20 mEq total) by mouth daily.  . pravastatin (PRAVACHOL) 20 MG tablet Take 20 mg by mouth at bedtime.  . traZODone (DESYREL) 100 MG tablet Take 100 mg by mouth at bedtime.     Allergies:   Contrast media [iodinated diagnostic agents] and Tetanus toxoids   Social History   Socioeconomic History  . Marital status: Married    Spouse name: Not on file  . Number of children: Not on file  . Years of education: Not on file  . Highest education level:  Not on file  Occupational History  . Not on file  Tobacco Use  . Smoking status: Former Research scientist (life sciences)  . Smokeless tobacco: Never Used  Vaping Use  . Vaping Use: Never used  Substance and Sexual Activity  . Alcohol use: No    Comment: occasionally  . Drug use: No  . Sexual activity: Yes  Other Topics Concern  . Not on file  Social History Narrative  . Not on file   Social Determinants of Health   Financial Resource Strain: Not on file  Food Insecurity: Not on file  Transportation Needs: Not on file  Physical Activity: Not on file  Stress: Not on file  Social Connections: Not on file     Family History:  The patient's family history includes Diabetes Mellitus II in his mother.   ROS:    Please see the history of present illness.    ROS All other systems reviewed and are negative.   PHYSICAL EXAM:   VS:  BP 132/80   Pulse 87   Ht 5\' 8"  (1.727 m)   Wt 216 lb (98 kg)   SpO2 98%   BMI 32.84 kg/m   Physical Exam  GEN: Obese, in no acute distress  Neck: no JVD, carotid bruits, or masses Cardiac:RRR; no murmurs, rubs, or gallops  Respiratory:  clear to auscultation bilaterally, normal work of breathing GI: soft, nontender, nondistended, + BS Ext: without cyanosis, clubbing, or edema, Good distal pulses bilaterally Neuro:  Alert and Oriented x 3 Psych: euthymic mood, full affect  Wt Readings from Last 3 Encounters:  08/18/20 216 lb (98 kg)  04/20/19 218 lb (98.9 kg)  12/29/18 204 lb 2.3 oz (92.6 kg)      Studies/Labs Reviewed:   EKG:  EKG is ordered today.  The ekg ordered today demonstrates NSR with PVC with prolonged QTc 461 unchanged  Recent Labs: No results found for requested labs within last 8760 hours.   Lipid Panel No results found for: CHOL, TRIG, HDL, CHOLHDL, VLDL, LDLCALC, LDLDIRECT  Additional studies/ records that were reviewed today include:  NST: 04/2018 FINDINGS:  Unexpected: None.  Perfusion defects: No reversible perfusion defects. Decreased activity in the inferior wall improves on stress imaging and may be artifactual.  TID ratio: <1.25; no transient ischemic dilatation on visual analysis.  Gated images:  Likely spurious due to adjacent bowel activity. Unable to interpret. Defer to ejection fraction and wall motion analysis on the echocardiogram performed today.  CONCLUSION:  No inducible ischemia.   Echocardiogram: 11/2018 IMPRESSIONS      1. The left ventricle has severely reduced systolic function, with an ejection fraction of 25-30%. The cavity size was normal. There is moderate concentric left ventricular hypertrophy. Left ventricular diastolic Doppler parameters are consistent with  restrictive filling. Elevated left  atrial and left ventricular end-diastolic pressures Left ventricular diffuse hypokinesis.  2. The right ventricle has mildly reduced systolic function. The cavity was moderately enlarged. There is no increase in right ventricular wall thickness.  3. Left atrial size was moderately dilated.  4. Right atrial size was mildly dilated.  5. The aortic valve is tricuspid. Aortic valve regurgitation was not assessed by color flow Doppler.      Risk Assessment/Calculations:         ASSESSMENT:    1. Renovascular hypertension   2. Chronic combined systolic and diastolic CHF (congestive heart failure) ([place])   3. NICM (nonischemic cardiomyopathy) ([place])   4. Essential hypertension   5. Hyperlipidemia, unspecified hyperlipidemia  type   6. CKD (chronic kidney disease) stage 5, GFR less than 15 ml/min (HCC)      PLAN:  In order of problems listed above:  Chronic combined systolic and diastolic CHF Echo 1031 LVEF 25 to 30%.  Was to have follow-up echo but has not been seen since 11/2018. Will order today. If EF still low consider increase coreg. Overall compensated.   Presumed nonischemic cardiomyopathy NST 2019 no ischemia, no cath because of CKD. No chest pain, very sedentary. Recommend increase his activity gradually  Hypertension BP controlled   Hyperlipidemia on pravastatin followed by PCP  CKD stage 5 Crt 5.69 05/28/20 followed by renal  Shared Decision Making/Informed Consent        Medication Adjustments/Labs and Tests Ordered: Current medicines are reviewed at length with the patient today.  Concerns regarding medicines are outlined above.  Medication changes, Labs and Tests ordered today are listed in the Patient Instructions below. There are no Patient Instructions on file for this visit.   Sumner Boast, PA-C  08/18/2020 12:41 PM    [facility] [address], [place], [place]  [address] Phone: [phone]; Fax: [phone]

## 2020-08-18 ENCOUNTER — Other Ambulatory Visit: Payer: Self-pay

## 2020-08-18 ENCOUNTER — Encounter: Payer: Self-pay | Admitting: Physician Assistant

## 2020-08-18 ENCOUNTER — Ambulatory Visit: Payer: Medicare PPO | Admitting: Physician Assistant

## 2020-08-18 VITALS — BP 132/80 | HR 87 | Ht 68.0 in | Wt 216.0 lb

## 2020-08-18 DIAGNOSIS — I5042 Chronic combined systolic (congestive) and diastolic (congestive) heart failure: Secondary | ICD-10-CM

## 2020-08-18 DIAGNOSIS — E785 Hyperlipidemia, unspecified: Secondary | ICD-10-CM | POA: Diagnosis not present

## 2020-08-18 DIAGNOSIS — I428 Other cardiomyopathies: Secondary | ICD-10-CM

## 2020-08-18 DIAGNOSIS — I150 Renovascular hypertension: Secondary | ICD-10-CM

## 2020-08-18 DIAGNOSIS — N185 Chronic kidney disease, stage 5: Secondary | ICD-10-CM

## 2020-08-18 NOTE — Patient Instructions (Signed)
Medication Instructions:  Your physician recommends that you continue on your current medications as directed. Please refer to the Current Medication list given to you today.  *If you need a refill on your cardiac medications before your next appointment, please call your pharmacy*   Lab Work: None If you have labs (blood work) drawn today and your tests are completely normal, you will receive your results only by: [place] MyChart Message (if you have MyChart) OR . A paper copy in the mail If you have any lab test that is abnormal or we need to change your treatment, we will call you to review the results.   Testing/Procedures: Your physician has requested that you have an echocardiogram. Echocardiography is a painless test that uses sound waves to create images of your heart. It provides your doctor with information about the size and shape of your heart and how well your heart's chambers and valves are working. This procedure takes approximately one hour. There are no restrictions for this procedure.     Follow-Up: At [facility], you and your health needs are our priority.  As part of our continuing mission to provide you with exceptional heart care, we have created designated Provider Care Teams.  These Care Teams include your primary Cardiologist (physician) and Advanced Practice Providers (APPs -  Physician Assistants and Nurse Practitioners) who all work together to provide you with the care you need, when you need it.  We recommend signing up for the patient portal called "MyChart".  Sign up information is provided on this After Visit Summary.  MyChart is used to connect with patients for Virtual Visits (Telemedicine).  Patients are able to view lab/test results, encounter notes, upcoming appointments, etc.  Non-urgent messages can be sent to your provider as well.   To learn more about what you can do with MyChart, go to [email or web address].    Your next appointment:   6  month(s)  The format for your next appointment:   In Person  Provider:   Carlyle Dolly, MD   Other Instructions    Echocardiogram An echocardiogram is a test that uses sound waves (ultrasound) to produce images of the heart. Images from an echocardiogram can provide important information about:  Heart size and shape.  The size and thickness and movement of your heart's walls.  Heart muscle function and strength.  Heart valve function or if you have stenosis. Stenosis is when the heart valves are too narrow.  If blood is flowing backward through the heart valves (regurgitation).  A tumor or infectious growth around the heart valves.  Areas of heart muscle that are not working well because of poor blood flow or injury from a heart attack.  Aneurysm detection. An aneurysm is a weak or damaged part of an artery wall. The wall bulges out from the normal force of blood pumping through the body. Tell a health care provider about:  Any allergies you have.  All medicines you are taking, including vitamins, herbs, eye drops, creams, and over-the-counter medicines.  Any blood disorders you have.  Any surgeries you have had.  Any medical conditions you have.  Whether you are pregnant or may be pregnant. What are the risks? Generally, this is a safe test. However, problems may occur, including an allergic reaction to dye (contrast) that may be used during the test. What happens before the test? No specific preparation is needed. You may eat and drink normally. What happens during the test?  You will take off  your clothes from the waist up and put on a hospital gown.  Electrodes or electrocardiogram (ECG)patches may be placed on your chest. The electrodes or patches are then connected to a device that monitors your heart rate and rhythm.  You will lie down on a table for an ultrasound exam. A gel will be applied to your chest to help sound waves pass through your skin.  A  handheld device, called a transducer, will be pressed against your chest and moved over your heart. The transducer produces sound waves that travel to your heart and bounce back (or "echo" back) to the transducer. These sound waves will be captured in real-time and changed into images of your heart that can be viewed on a video monitor. The images will be recorded on a computer and reviewed by your health care provider.  You may be asked to change positions or hold your breath for a short time. This makes it easier to get different views or better views of your heart.  In some cases, you may receive contrast through an IV in one of your veins. This can improve the quality of the pictures from your heart. The procedure may vary among health care providers and hospitals.   What can I expect after the test? You may return to your normal, everyday life, including diet, activities, and medicines, unless your health care provider tells you not to do that. Follow these instructions at home:  It is up to you to get the results of your test. Ask your health care provider, or the department that is doing the test, when your results will be ready.  Keep all follow-up visits. This is important. Summary  An echocardiogram is a test that uses sound waves (ultrasound) to produce images of the heart.  Images from an echocardiogram can provide important information about the size and shape of your heart, heart muscle function, heart valve function, and other possible heart problems.  You do not need to do anything to prepare before this test. You may eat and drink normally.  After the echocardiogram is completed, you may return to your normal, everyday life, unless your health care provider tells you not to do that. This information is not intended to replace advice given to you by your health care provider. Make sure you discuss any questions you have with your health care provider. Document Revised:  01/08/2020 Document Reviewed: 01/08/2020 Elsevier Patient Education  2021 [employer].

## 2020-09-04 ENCOUNTER — Other Ambulatory Visit: Payer: Self-pay

## 2020-09-04 ENCOUNTER — Ambulatory Visit (HOSPITAL_COMMUNITY)
Admission: RE | Admit: 2020-09-04 | Discharge: 2020-09-04 | Disposition: A | Discharge status: Home / Self Care | Payer: Medicare PPO | Source: Ambulatory Visit | Attending: Physician Assistant | Admitting: Physician Assistant

## 2020-09-04 DIAGNOSIS — I428 Other cardiomyopathies: Secondary | ICD-10-CM | POA: Insufficient documentation

## 2020-09-04 LAB — ECHOCARDIOGRAM COMPLETE
AR max vel: 2.44 cm2
AV Area VTI: 2.35 cm2
AV Area mean vel: 2.17 cm2
AV Mean grad: 4.6 mmHg
AV Peak grad: 8 mmHg
Ao pk vel: 1.42 m/s
Area-P 1/2: 5.84 cm2
Calc EF: 33.7 %
MV M vel: 5.21 m/s
MV Peak grad: 108.6 mmHg
Radius: 0.6 cm
S' Lateral: 5.2 cm
Single Plane A2C EF: 30.3 %
Single Plane A4C EF: 37.6 %

## 2020-09-04 NOTE — Progress Notes (Signed)
*  PRELIMINARY RESULTS* Echocardiogram 2D Echocardiogram has been performed.  Steven Perez 09/04/2020, 9:15 AM

## 2020-09-05 ENCOUNTER — Telehealth: Payer: Self-pay | Admitting: *Deleted

## 2020-09-05 MED ORDER — CARVEDILOL 25 MG PO TABS: 25.0000 mg | ORAL_TABLET | Freq: Two times a day (BID) | ORAL | 3 refills | Status: DC

## 2020-09-05 NOTE — Telephone Encounter (Signed)
-----   Message from Imogene Burn, PA-C sent at 09/05/2020  6:31 AM EDT ----- Heart function still down but slightly improved. Heart has trouble relaxing and pulmonary pressures elevated. Renal doses diuretics so I can't change this with CKD. Try to increase coreg 25 mg bid. thanks

## 2020-09-18 ENCOUNTER — Telehealth: Payer: Self-pay

## 2020-09-18 MED ORDER — HYDRALAZINE HCL 25 MG PO TABS: 25.0000 mg | ORAL_TABLET | Freq: Two times a day (BID) | ORAL | 3 refills | Status: DC

## 2020-09-18 NOTE — Telephone Encounter (Signed)
Medication refill request for Hydralazine 25 mg tablets approved by Gerrianne Scale, PA-C and sent to [facility].

## 2020-09-25 DIAGNOSIS — Z23 Encounter for immunization: Secondary | ICD-10-CM | POA: Diagnosis not present

## 2020-09-29 DIAGNOSIS — N185 Chronic kidney disease, stage 5: Secondary | ICD-10-CM | POA: Diagnosis not present

## 2020-09-30 DIAGNOSIS — Q6650 Congenital pes planus, unspecified foot: Secondary | ICD-10-CM | POA: Diagnosis not present

## 2020-09-30 DIAGNOSIS — I10 Essential (primary) hypertension: Secondary | ICD-10-CM | POA: Diagnosis not present

## 2020-09-30 DIAGNOSIS — E119 Type 2 diabetes mellitus without complications: Secondary | ICD-10-CM | POA: Diagnosis not present

## 2020-09-30 DIAGNOSIS — Z1389 Encounter for screening for other disorder: Secondary | ICD-10-CM | POA: Diagnosis not present

## 2020-09-30 DIAGNOSIS — E1129 Type 2 diabetes mellitus with other diabetic kidney complication: Secondary | ICD-10-CM | POA: Diagnosis not present

## 2020-09-30 DIAGNOSIS — Z6837 Body mass index (BMI) 37.0-37.9, adult: Secondary | ICD-10-CM | POA: Diagnosis not present

## 2020-09-30 DIAGNOSIS — I5032 Chronic diastolic (congestive) heart failure: Secondary | ICD-10-CM | POA: Diagnosis not present

## 2020-09-30 DIAGNOSIS — E7849 Other hyperlipidemia: Secondary | ICD-10-CM | POA: Diagnosis not present

## 2020-09-30 DIAGNOSIS — N186 End stage renal disease: Secondary | ICD-10-CM | POA: Diagnosis not present

## 2020-09-30 DIAGNOSIS — Z0000 Encounter for general adult medical examination without abnormal findings: Secondary | ICD-10-CM | POA: Diagnosis not present

## 2020-10-06 DIAGNOSIS — N185 Chronic kidney disease, stage 5: Secondary | ICD-10-CM | POA: Diagnosis not present

## 2020-10-06 DIAGNOSIS — D631 Anemia in chronic kidney disease: Secondary | ICD-10-CM | POA: Diagnosis not present

## 2020-10-06 DIAGNOSIS — N2581 Secondary hyperparathyroidism of renal origin: Secondary | ICD-10-CM | POA: Diagnosis not present

## 2020-10-06 DIAGNOSIS — E1122 Type 2 diabetes mellitus with diabetic chronic kidney disease: Secondary | ICD-10-CM | POA: Diagnosis not present

## 2020-10-06 DIAGNOSIS — I120 Hypertensive chronic kidney disease with stage 5 chronic kidney disease or end stage renal disease: Secondary | ICD-10-CM | POA: Diagnosis not present

## 2020-10-22 DIAGNOSIS — Z1212 Encounter for screening for malignant neoplasm of rectum: Secondary | ICD-10-CM | POA: Diagnosis not present

## 2020-10-22 DIAGNOSIS — Z1211 Encounter for screening for malignant neoplasm of colon: Secondary | ICD-10-CM | POA: Diagnosis not present

## 2020-12-16 DIAGNOSIS — N185 Chronic kidney disease, stage 5: Secondary | ICD-10-CM | POA: Diagnosis not present

## 2020-12-23 DIAGNOSIS — N185 Chronic kidney disease, stage 5: Secondary | ICD-10-CM | POA: Diagnosis not present

## 2020-12-23 DIAGNOSIS — D631 Anemia in chronic kidney disease: Secondary | ICD-10-CM | POA: Diagnosis not present

## 2020-12-23 DIAGNOSIS — E1122 Type 2 diabetes mellitus with diabetic chronic kidney disease: Secondary | ICD-10-CM | POA: Diagnosis not present

## 2020-12-23 DIAGNOSIS — N2581 Secondary hyperparathyroidism of renal origin: Secondary | ICD-10-CM | POA: Diagnosis not present

## 2020-12-23 DIAGNOSIS — I120 Hypertensive chronic kidney disease with stage 5 chronic kidney disease or end stage renal disease: Secondary | ICD-10-CM | POA: Diagnosis not present

## 2021-01-15 DIAGNOSIS — E119 Type 2 diabetes mellitus without complications: Secondary | ICD-10-CM | POA: Diagnosis not present

## 2021-02-10 DIAGNOSIS — N185 Chronic kidney disease, stage 5: Secondary | ICD-10-CM | POA: Diagnosis not present

## 2021-02-17 DIAGNOSIS — N2581 Secondary hyperparathyroidism of renal origin: Secondary | ICD-10-CM | POA: Diagnosis not present

## 2021-02-17 DIAGNOSIS — D631 Anemia in chronic kidney disease: Secondary | ICD-10-CM | POA: Diagnosis not present

## 2021-02-17 DIAGNOSIS — I120 Hypertensive chronic kidney disease with stage 5 chronic kidney disease or end stage renal disease: Secondary | ICD-10-CM | POA: Diagnosis not present

## 2021-02-17 DIAGNOSIS — N185 Chronic kidney disease, stage 5: Secondary | ICD-10-CM | POA: Diagnosis not present

## 2021-02-17 DIAGNOSIS — Z23 Encounter for immunization: Secondary | ICD-10-CM | POA: Diagnosis not present

## 2021-02-20 ENCOUNTER — Encounter: Payer: Self-pay | Admitting: Cardiology

## 2021-02-20 ENCOUNTER — Other Ambulatory Visit: Payer: Self-pay

## 2021-02-20 ENCOUNTER — Ambulatory Visit: Payer: Medicare PPO | Admitting: Cardiology

## 2021-02-20 VITALS — BP 136/70 | HR 77 | Ht 68.0 in | Wt 212.0 lb

## 2021-02-20 DIAGNOSIS — I5022 Chronic systolic (congestive) heart failure: Secondary | ICD-10-CM | POA: Diagnosis not present

## 2021-02-20 MED ORDER — ISOSORBIDE MONONITRATE ER 30 MG PO TB24: 15.0000 mg | ORAL_TABLET | Freq: Every day | ORAL | 3 refills | Status: DC

## 2021-02-20 NOTE — Patient Instructions (Signed)
Medication Instructions:  Your physician has recommended you make the following change in your medication:  START Isosorbide Mononitrate 15 mg tablets daily  *If you need a refill on your cardiac medications before your next appointment, please call your pharmacy*   Lab Work: None If you have labs (blood work) drawn today and your tests are completely normal, you will receive your results only by: [place] (if you have MyChart) OR A paper copy in the mail If you have any lab test that is abnormal or we need to change your treatment, we will call you to review the results.   Testing/Procedures: None   Follow-Up: At [facility], you and your health needs are our priority.  As part of our continuing mission to provide you with exceptional heart care, we have created designated Provider Care Teams.  These Care Teams include your primary Cardiologist (physician) and Advanced Practice Providers (APPs -  Physician Assistants and Nurse Practitioners) who all work together to provide you with the care you need, when you need it.  We recommend signing up for the patient portal called "MyChart".  Sign up information is provided on this After Visit Summary.  MyChart is used to connect with patients for Virtual Visits (Telemedicine).  Patients are able to view lab/test results, encounter notes, upcoming appointments, etc.  Non-urgent messages can be sent to your provider as well.   To learn more about what you can do with MyChart, go to [email or web address].    Your next appointment:   1 month(s)  The format for your next appointment:   In Person  Provider:   You will see one of the following Advanced Practice Providers on your designated Care Team:   [place], PA-C  Ermalinda Barrios, [place]      Other Instructions

## 2021-02-20 NOTE — Progress Notes (Signed)
Clinical Summary Steven Perez is a 70 y.o.male seen today for follow up of the following medical problems. I had seen him in the hospital 2 years ago, this is our first clinic visit.    1.Chronic combined systolic/diastolic HF - echocardiogram at [facility] 04/2018, his EF was 40% but no wall motion abnormalities and a Myoview was without ischemia - 08/2020 echo: LVEF 30-35%, global hypokinesis, grade III dd, normal RV, PASP 86  Some SOB that is chronic. DOE with walking up a flight of stairs which is chronic - no recent edema - compliant with meds. Takes lasix 80mg  in AM and 40mg  in PM     2. CKD V - not on HD, though reports very borderline.  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   Past Medical History:  Diagnosis Date   Acute on chronic renal insufficiency    Anemia    assoc w/ chronic reanl failure   Arthralgia    Chronic kidney disease    Stage 4   Diabetes mellitus    Dyslipidemia    Erectile dysfunction due to arterial insufficiency    HLD (hyperlipidemia)    Hypertension    Insomnia    Left renal mass    Obesity    Secondary hyperparathyroidism of renal origin ([place])    Type 2 diabetes mellitus (HCC)      Allergies  Allergen Reactions   Contrast Media [Iodinated Diagnostic Agents] Other (See Comments)    Renal failure after administration of CT contrast     Tetanus Toxoids Swelling    "bad reaction;" told not to take this again     Current Outpatient Medications  Medication Sig Dispense Refill   carvedilol (COREG) 25 MG tablet Take 1 tablet (25 mg total) by mouth 2 (two) times daily with a meal. 180 tablet 3   doxazosin (CARDURA) 4 MG tablet Take 4 mg by mouth at bedtime.     furosemide (LASIX) 40 MG tablet Take 40 mg by mouth 3 (three) times daily.     hydrALAZINE (APRESOLINE) 25 MG tablet Take 1 tablet (25 mg  total) by mouth 2 (two) times daily. 180 tablet 3   insulin degludec (TRESIBA) 200 UNIT/ML FlexTouch Pen Inject 20 Units into the skin at bedtime.     potassium chloride SA (K-DUR) 20 MEQ tablet Take 1 tablet (20 mEq total) by mouth daily. 30 tablet 2   pravastatin (PRAVACHOL) 20 MG tablet Take 20 mg by mouth at bedtime.     traZODone (DESYREL) 100 MG tablet Take 100 mg by mouth at bedtime.     No current facility-administered medications for this visit.     Past Surgical History:  Procedure Laterality Date   AV FISTULA PLACEMENT Left 11/02/2016   Procedure: ARTERIOVENOUS (AV) FISTULA CREATION LEFT UPPER ARM;  Surgeon: Elam Dutch, MD;  Location: [facility];  Service: Vascular;  Laterality: Left;   IR GENERIC HISTORICAL  06/04/2014   IR RADIOLOGIST EVAL & MGMT 06/04/2014 Aletta Edouard, MD [facility] INTERV RAD   IR GENERIC HISTORICAL  01/15/2016   IR RADIOLOGIST EVAL & MGMT 01/15/2016 [facility] INTERV RAD   IR RADIOLOGIST EVAL & MGMT  01/25/2017   IR RADIOLOGIST EVAL & MGMT  05/01/2019   RENAL BIOPSY     RENAL BIOPSY, PERCUTANEOUS     TONSILLECTOMY  Allergies  Allergen Reactions   Contrast Media [Iodinated Diagnostic Agents] Other (See Comments)    Renal failure after administration of CT contrast     Tetanus Toxoids Swelling    "bad reaction;" told not to take this again      Family History  Problem Relation Age of Onset   Diabetes Mellitus II Mother    CAD Neg Hx    Stroke Neg Hx      Social History Mr. Cada reports that he has quit smoking. He has never used smokeless tobacco. Mr. Drewes reports no history of alcohol use.   Review of Systems CONSTITUTIONAL: No weight loss, fever, chills, weakness or fatigue.  HEENT: Eyes: No visual loss, blurred vision, double vision or yellow sclerae.No hearing loss, sneezing, congestion, runny nose or sore throat.  SKIN: No rash or itching.  CARDIOVASCULAR: per hpi RESPIRATORY: No shortness of breath, cough or sputum.   GASTROINTESTINAL: No anorexia, nausea, vomiting or diarrhea. No abdominal pain or blood.  GENITOURINARY: No burning on urination, no polyuria NEUROLOGICAL: No headache, dizziness, syncope, paralysis, ataxia, numbness or tingling in the extremities. No change in bowel or bladder control.  MUSCULOSKELETAL: No muscle, back pain, joint pain or stiffness.  LYMPHATICS: No enlarged nodes. No history of splenectomy.  PSYCHIATRIC: No history of depression or anxiety.  ENDOCRINOLOGIC: No reports of sweating, cold or heat intolerance. No polyuria or polydipsia.  Marland Kitchen   Physical Examination Today's Vitals   02/20/21 1248  BP: 136/70  Pulse: 77  SpO2: 100%  Weight: 212 lb (96.2 kg)  Height: 5\' 8"  (1.727 m)   Body mass index is 32.23 kg/m.  Gen: resting comfortably, no acute distress HEENT: no scleral icterus, pupils equal round and reactive, no palptable cervical adenopathy,  CV: RRR, no m/r/g no jvd Resp: Clear to auscultation bilaterally GI: abdomen is soft, non-tender, non-distended, normal bowel sounds, no hepatosplenomegaly MSK: extremities are warm, no edema.  Skin: warm, no rash Neuro:  no focal deficits Psych: appropriate affect    Assessment and Plan  Chronic systolic HF -chronic stbale symptoms - medical therapy limited by severe renal dysfunction -continue core,g hydralzine. 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 Hold for RHC until he can also have LHC     Arnoldo Lenis, M.D.,

## 2021-02-26 DIAGNOSIS — Z23 Encounter for immunization: Secondary | ICD-10-CM | POA: Diagnosis not present

## 2021-03-20 DIAGNOSIS — E782 Mixed hyperlipidemia: Secondary | ICD-10-CM | POA: Diagnosis not present

## 2021-03-20 DIAGNOSIS — E211 Secondary hyperparathyroidism, not elsewhere classified: Secondary | ICD-10-CM | POA: Diagnosis not present

## 2021-03-20 DIAGNOSIS — E669 Obesity, unspecified: Secondary | ICD-10-CM | POA: Diagnosis not present

## 2021-03-20 DIAGNOSIS — N185 Chronic kidney disease, stage 5: Secondary | ICD-10-CM | POA: Diagnosis not present

## 2021-03-20 DIAGNOSIS — Z6832 Body mass index (BMI) 32.0-32.9, adult: Secondary | ICD-10-CM | POA: Diagnosis not present

## 2021-03-20 DIAGNOSIS — E1129 Type 2 diabetes mellitus with other diabetic kidney complication: Secondary | ICD-10-CM | POA: Diagnosis not present

## 2021-03-20 DIAGNOSIS — E119 Type 2 diabetes mellitus without complications: Secondary | ICD-10-CM | POA: Diagnosis not present

## 2021-03-20 DIAGNOSIS — I10 Essential (primary) hypertension: Secondary | ICD-10-CM | POA: Diagnosis not present

## 2021-03-26 ENCOUNTER — Other Ambulatory Visit: Payer: Self-pay

## 2021-03-26 ENCOUNTER — Ambulatory Visit: Payer: Medicare PPO | Admitting: Student

## 2021-03-26 ENCOUNTER — Encounter: Payer: Self-pay | Admitting: Student

## 2021-03-26 VITALS — BP 158/78 | HR 72 | Ht 68.0 in | Wt 221.0 lb

## 2021-03-26 DIAGNOSIS — N185 Chronic kidney disease, stage 5: Secondary | ICD-10-CM

## 2021-03-26 DIAGNOSIS — E785 Hyperlipidemia, unspecified: Secondary | ICD-10-CM | POA: Diagnosis not present

## 2021-03-26 DIAGNOSIS — I428 Other cardiomyopathies: Secondary | ICD-10-CM | POA: Diagnosis not present

## 2021-03-26 DIAGNOSIS — I10 Essential (primary) hypertension: Secondary | ICD-10-CM | POA: Diagnosis not present

## 2021-03-26 DIAGNOSIS — I5042 Chronic combined systolic (congestive) and diastolic (congestive) heart failure: Secondary | ICD-10-CM

## 2021-03-26 MED ORDER — ISOSORBIDE MONONITRATE ER 30 MG PO TB24: 30.0000 mg | ORAL_TABLET | Freq: Every day | ORAL | 3 refills | Status: DC

## 2021-03-26 NOTE — Progress Notes (Signed)
Cardiology Office Note    Date:  03/26/2021   ID:  Steven Perez, DOB 1950/08/17, MRN [id number]  PCP:  Redmond School, MD  Cardiologist: Carlyle Dolly, MD    Chief Complaint  Patient presents with   Follow-up    1 month visit    History of Present Illness:    Steven Perez is a 70 y.o. 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  He was examined by Dr. Harl Bowie in 01/2021 and reported baseline dyspnea on exertion with no acute change in this and he denied any recent edema. Was taking Lasix 80 mg in AM/40 mg in PM per Nephrology. He was continued on Coreg and Hydralazine with Imdur 15 mg daily being added to his regimen. It was recommended that if he were to start HD in the future, would then adjust his medication regimen and consider R/LHC.  Close follow-up was recommended for further titration of Hydralazine and Nitrates.  In talking with the patient today, he reports overall doing well since his last visit. He does have baseline dyspnea on exertion which has been stable over the years. No recent chest pain or palpitations. No reported orthopnea, PND or pitting edema. He has been taking Imdur since his last visit and is unaware of any side effects thus far.    Past Medical History:  Diagnosis Date   Acute on chronic renal insufficiency    Anemia    assoc w/ chronic reanl failure   Arthralgia    CHF (congestive heart failure) (HCC)    a. EF 40% in 04/2018 and NST showing no ischemia b. EF at 25-30% in 11/2018 c. 30-35% in 08/2020   Chronic kidney disease    Stage 4   Diabetes mellitus    Dyslipidemia    Erectile dysfunction due to arterial insufficiency    HLD (hyperlipidemia)    Hypertension    Insomnia    Left renal mass    Obesity     Secondary hyperparathyroidism of renal origin ([place])    Type 2 diabetes mellitus ([place])     Past Surgical History:  Procedure Laterality Date   AV FISTULA PLACEMENT Left 11/02/2016   Procedure: ARTERIOVENOUS (AV) FISTULA CREATION LEFT UPPER ARM;  Surgeon: Elam Dutch, MD;  Location: [facility] OR;  Service: Vascular;  Laterality: Left;   IR GENERIC HISTORICAL  06/04/2014   IR RADIOLOGIST EVAL & MGMT 06/04/2014 Aletta Edouard, MD [facility] INTERV RAD   IR GENERIC HISTORICAL  01/15/2016   IR RADIOLOGIST EVAL & MGMT 01/15/2016 [facility] INTERV RAD   IR RADIOLOGIST EVAL & MGMT  01/25/2017   IR RADIOLOGIST EVAL & MGMT  05/01/2019   RENAL BIOPSY     RENAL BIOPSY, PERCUTANEOUS     TONSILLECTOMY      Current Medications: Outpatient Medications Prior to Visit  Medication Sig Dispense Refill   carvedilol (COREG) 25 MG tablet Take 1 tablet (25 mg total) by mouth 2 (two) times daily with a meal. 180 tablet 3   doxazosin (CARDURA) 4 MG tablet Take 4 mg by mouth at bedtime.     furosemide (LASIX) 40 MG tablet Take 40 mg by mouth 3 (three) times daily.     hydrALAZINE (APRESOLINE) 25 MG tablet Take 1 tablet (25 mg  total) by mouth 2 (two) times daily. 180 tablet 3   insulin degludec (TRESIBA) 200 UNIT/ML FlexTouch Pen Inject 20 Units into the skin at bedtime.     potassium chloride SA (K-DUR) 20 MEQ tablet Take 1 tablet (20 mEq total) by mouth daily. 30 tablet 2   pravastatin (PRAVACHOL) 20 MG tablet Take 20 mg by mouth at bedtime.     sodium bicarbonate 650 MG tablet Take 650 mg by mouth 3 (three) times daily.     traZODone (DESYREL) 100 MG tablet Take 100 mg by mouth at bedtime.     isosorbide mononitrate (IMDUR) 30 MG 24 hr tablet Take 0.5 tablets (15 mg total) by mouth daily. 45 tablet 3   No facility-administered medications prior to visit.     Allergies:   Contrast media [iodinated diagnostic agents] and Tetanus toxoids   Social History   Socioeconomic History   Marital status: Married    Spouse name:  Not on file   Number of children: Not on file   Years of education: Not on file   Highest education level: Not on file  Occupational History   Not on file  Tobacco Use   Smoking status: Former   Smokeless tobacco: Never  Vaping Use   Vaping Use: Never used  Substance and Sexual Activity   Alcohol use: No    Comment: occasionally   Drug use: No   Sexual activity: Yes  Other Topics Concern   Not on file  Social History Narrative   Not on file   Social Determinants of Health   Financial Resource Strain: Not on file  Food Insecurity: Not on file  Transportation Needs: Not on file  Physical Activity: Not on file  Stress: Not on file  Social Connections: Not on file     Family History:  The patient's family history includes Diabetes Mellitus II in his mother.   Review of Systems:    Please see the history of present illness.     All other systems reviewed and are otherwise negative except as noted above.   Physical Exam:    VS:  BP (!) 158/78   Pulse 72   Ht 5\' 8"  (1.727 m)   Wt 221 lb (100.2 kg)   SpO2 99%   BMI 33.60 kg/m    General: Well developed, well nourished,male appearing in no acute distress. Head: Normocephalic, atraumatic. Neck: No carotid bruits. JVD not elevated.  Lungs: Respirations regular and unlabored, without wheezes or rales.  Heart: Regular rate and rhythm. No S3 or S4.  No murmur, no rubs, or gallops appreciated. Abdomen: Appears non-distended. No obvious abdominal masses. Msk:  Strength and tone appear normal for age. No obvious joint deformities or effusions. Extremities: No clubbing or cyanosis. Trace lower extremity edema bilaterally.  Distal pedal pulses are 2+ bilaterally. Neuro: Alert and oriented X 3. Moves all extremities spontaneously. No focal deficits noted. Psych:  Responds to questions appropriately with a normal affect. Skin: No rashes or lesions noted  Wt Readings from Last 3 Encounters:  03/26/21 221 lb (100.2 kg)   02/20/21 212 lb (96.2 kg)  08/18/20 216 lb (98 kg)     Studies/Labs Reviewed:   EKG:  EKG is not ordered today.    Recent Labs: No results found for requested labs within last 8760 hours.   Lipid Panel No results found for: CHOL, TRIG, HDL, CHOLHDL, VLDL, LDLCALC, LDLDIRECT  Additional studies/ records that were reviewed today include:   Echocardiogram: 08/2020 IMPRESSIONS  1. Left ventricular ejection fraction, by estimation, is 30 to 35%. The  left ventricle has moderately decreased function. The left ventricle  demonstrates global hypokinesis. The left ventricular internal cavity size  was mildly dilated. There is mild  left ventricular hypertrophy. Left ventricular diastolic parameters are  consistent with Grade III diastolic dysfunction (restrictive). Elevated  left atrial pressure.   2. Right ventricular systolic function is normal. The right ventricular  size is normal. There is severely elevated pulmonary artery systolic  pressure.   3. Left atrial size was severely dilated.   4. Right atrial size was severely dilated.   5. The mitral valve is normal in structure. Moderate mitral valve  regurgitation. No evidence of mitral stenosis.   6. Tricuspid valve regurgitation is moderate.   7. The aortic valve is tricuspid. There is mild calcification of the  aortic valve. There is mild thickening of the aortic valve. Aortic valve  regurgitation is not visualized. No aortic stenosis is present.   8. Severe pulmonary HTN, PASP is 86 mmHg.   9. The inferior vena cava is dilated in size with <50% respiratory  variability, suggesting right atrial pressure of 15 mmHg.   Assessment:    1. Chronic combined systolic and diastolic CHF (congestive heart failure) ([place])   2. NICM (nonischemic cardiomyopathy) ([place])   3. Essential hypertension   4. Hyperlipidemia, unspecified hyperlipidemia type   5. CKD (chronic kidney disease) stage 5, GFR less than 15 ml/min (HCC)       Plan:   In order of problems listed above:  1. HFrEF/NICM - His EF was at 30-35% in 08/2020 and medical therapy has been limited given his CKD and further ischemic evaluation has not been pursued given his CKD as well (prior NST in 2019 did not show any significant ischemia). Also attempting to further titrate medical therapy prior to consideration of ICD placement.  - Will defer diuretic dosing to Nephrology but he remains on Lasix 40mg  TID.  - He is currently taking Coreg 25mg  BID, Imdur 15mg  daily and Hydralazine 25mg  BID for his cardiomyopathy. Will titrate Imdur to 30mg  daily. I did encourage him to keep a BP log and report back with readings in 2-3 weeks. If BP allows, would plan to further titrate Hydralazine.   2. HTN - His BP is elevated at 158/78 but rechecked and improved to 142/82. Will titrate Imdur as outlined above and continue Coreg, Cardura and Hydralazine at current dosing for now.   3. HLD - Followed by his PCP. He remains on Pravastatin 20mg  daily.   4. Stage 5 CKD - Followed by Dr. Posey Pronto with [facility] and his creatinine was at 8.19 in 01/2021. He does have a fistula in place but has not yet been started on dialysis.     Medication Adjustments/Labs and Tests Ordered: Current medicines are reviewed at length with the patient today.  Concerns regarding medicines are outlined above.  Medication changes, Labs and Tests ordered today are listed in the Patient Instructions below. Patient Instructions  Medication Instructions:   Increase Imdur to 30 mg Daily   *If you need a refill on your cardiac medications before your next appointment, please call your pharmacy*   Lab Work: NONE   If you have labs (blood work) drawn today and your tests are completely normal, you will receive your results only by: [place] (if you have MyChart) OR A paper copy in the mail If you have any lab test that is abnormal or  we need to change your treatment, we will  call you to review the results.   Testing/Procedures: None    Follow-Up: At [facility], you and your health needs are our priority.  As part of our continuing mission to provide you with exceptional heart care, we have created designated Provider Care Teams.  These Care Teams include your primary Cardiologist (physician) and Advanced Practice Providers (APPs -  Physician Assistants and Nurse Practitioners) who all work together to provide you with the care you need, when you need it.  We recommend signing up for the patient portal called "MyChart".  Sign up information is provided on this After Visit Summary.  MyChart is used to connect with patients for Virtual Visits (Telemedicine).  Patients are able to view lab/test results, encounter notes, upcoming appointments, etc.  Non-urgent messages can be sent to your provider as well.   To learn more about what you can do with MyChart, go to [email or web address].    Your next appointment:   2 -3 month(s)  The format for your next appointment:   In Person  Provider:   Carlyle Dolly, MD or Bernerd Pho, PA-C   Other Instructions Your physician has requested that you regularly monitor and record your blood pressure readings at home for 3 weeks. Please use the same machine at the same time of day to check your readings and record them to bring to your follow-up visit.  Thank you for choosing [facility]!     Signed, Erma Heritage, PA-C  03/26/2021 8:46 PM    [place] [address] [place], [place] [address] Phone: [phone] Fax: [phone]

## 2021-03-26 NOTE — Patient Instructions (Signed)
Medication Instructions:   Increase Imdur to 30 mg Daily   *If you need a refill on your cardiac medications before your next appointment, please call your pharmacy*   Lab Work: NONE   If you have labs (blood work) drawn today and your tests are completely normal, you will receive your results only by: [employer] (if you have MyChart) OR A paper copy in the mail If you have any lab test that is abnormal or we need to change your treatment, we will call you to review the results.   Testing/Procedures: None    Follow-Up: At [facility], you and your health needs are our priority.  As part of our continuing mission to provide you with exceptional heart care, we have created designated Provider Care Teams.  These Care Teams include your primary Cardiologist (physician) and Advanced Practice Providers (APPs -  Physician Assistants and Nurse Practitioners) who all work together to provide you with the care you need, when you need it.  We recommend signing up for the patient portal called "MyChart".  Sign up information is provided on this After Visit Summary.  MyChart is used to connect with patients for Virtual Visits (Telemedicine).  Patients are able to view lab/test results, encounter notes, upcoming appointments, etc.  Non-urgent messages can be sent to your provider as well.   To learn more about what you can do with MyChart, go to [email or web address].    Your next appointment:   2 -3 month(s)  The format for your next appointment:   In Person  Provider:   Carlyle Dolly, MD or Bernerd Pho, PA-C   Other Instructions Your physician has requested that you regularly monitor and record your blood pressure readings at home for 3 weeks. Please use the same machine at the same time of day to check your readings and record them to bring to your follow-up visit.  Thank you for choosing [facility]!

## 2021-04-01 DIAGNOSIS — N185 Chronic kidney disease, stage 5: Secondary | ICD-10-CM | POA: Diagnosis not present

## 2021-04-10 DIAGNOSIS — I120 Hypertensive chronic kidney disease with stage 5 chronic kidney disease or end stage renal disease: Secondary | ICD-10-CM | POA: Diagnosis not present

## 2021-04-10 DIAGNOSIS — N185 Chronic kidney disease, stage 5: Secondary | ICD-10-CM | POA: Diagnosis not present

## 2021-04-10 DIAGNOSIS — N2581 Secondary hyperparathyroidism of renal origin: Secondary | ICD-10-CM | POA: Diagnosis not present

## 2021-04-10 DIAGNOSIS — D631 Anemia in chronic kidney disease: Secondary | ICD-10-CM | POA: Diagnosis not present

## 2021-04-30 ENCOUNTER — Other Ambulatory Visit: Payer: Self-pay

## 2021-04-30 ENCOUNTER — Encounter (HOSPITAL_COMMUNITY)
Admission: RE | Admit: 2021-04-30 | Discharge: 2021-04-30 | Disposition: A | Discharge status: Home / Self Care | Payer: Medicare PPO | Source: Ambulatory Visit | Attending: Nephrology | Admitting: Nephrology

## 2021-04-30 VITALS — BP 159/80 | HR 89 | Ht 68.0 in | Wt 217.0 lb

## 2021-04-30 DIAGNOSIS — D631 Anemia in chronic kidney disease: Secondary | ICD-10-CM | POA: Diagnosis not present

## 2021-04-30 DIAGNOSIS — N185 Chronic kidney disease, stage 5: Secondary | ICD-10-CM | POA: Diagnosis not present

## 2021-04-30 LAB — IRON AND TIBC
Iron: 56 ug/dL (ref 45–182)
Saturation Ratios: 23 % (ref 17.9–39.5)
TIBC: 243 ug/dL — ABNORMAL LOW (ref 250–450)
UIBC: 187 ug/dL

## 2021-04-30 LAB — FERRITIN: Ferritin: 634 ng/mL — ABNORMAL HIGH (ref 24–336)

## 2021-04-30 LAB — POCT HEMOGLOBIN-HEMACUE: Hemoglobin: 9 g/dL — ABNORMAL LOW (ref 13.0–17.0)

## 2021-04-30 MED ORDER — EPOETIN ALFA-EPBX 10000 UNIT/ML IJ SOLN
INTRAMUSCULAR | Status: AC
  Filled 2021-04-30: qty 1

## 2021-04-30 MED ORDER — EPOETIN ALFA-EPBX 10000 UNIT/ML IJ SOLN
10000.0000 [IU] | Freq: Once | INTRAMUSCULAR | Status: AC
Start: 2021-04-30 — End: 2021-04-30
  Administered 2021-04-30: 10000 [IU] via SUBCUTANEOUS

## 2021-05-21 ENCOUNTER — Encounter (HOSPITAL_COMMUNITY): Payer: Self-pay

## 2021-05-21 ENCOUNTER — Encounter (HOSPITAL_COMMUNITY)
Admission: RE | Admit: 2021-05-21 | Discharge: 2021-05-21 | Disposition: A | Discharge status: Home / Self Care | Payer: Medicare PPO | Source: Ambulatory Visit | Attending: Nephrology | Admitting: Nephrology

## 2021-05-21 ENCOUNTER — Other Ambulatory Visit: Payer: Self-pay

## 2021-05-21 DIAGNOSIS — N185 Chronic kidney disease, stage 5: Secondary | ICD-10-CM | POA: Diagnosis not present

## 2021-05-21 DIAGNOSIS — D631 Anemia in chronic kidney disease: Secondary | ICD-10-CM | POA: Diagnosis not present

## 2021-05-21 LAB — POCT HEMOGLOBIN-HEMACUE: Hemoglobin: 9.5 g/dL — ABNORMAL LOW (ref 13.0–17.0)

## 2021-05-21 MED ORDER — EPOETIN ALFA-EPBX 10000 UNIT/ML IJ SOLN
10000.0000 [IU] | Freq: Once | INTRAMUSCULAR | Status: AC
  Administered 2021-05-21: 09:00:00 10000 [IU] via SUBCUTANEOUS

## 2021-05-21 MED ORDER — EPOETIN ALFA-EPBX 10000 UNIT/ML IJ SOLN
INTRAMUSCULAR | Status: AC
  Filled 2021-05-21: qty 1

## 2021-06-02 DIAGNOSIS — N185 Chronic kidney disease, stage 5: Secondary | ICD-10-CM | POA: Diagnosis not present

## 2021-06-09 DIAGNOSIS — D631 Anemia in chronic kidney disease: Secondary | ICD-10-CM | POA: Diagnosis not present

## 2021-06-09 DIAGNOSIS — N185 Chronic kidney disease, stage 5: Secondary | ICD-10-CM | POA: Diagnosis not present

## 2021-06-09 DIAGNOSIS — I120 Hypertensive chronic kidney disease with stage 5 chronic kidney disease or end stage renal disease: Secondary | ICD-10-CM | POA: Diagnosis not present

## 2021-06-09 DIAGNOSIS — N2581 Secondary hyperparathyroidism of renal origin: Secondary | ICD-10-CM | POA: Diagnosis not present

## 2021-06-11 ENCOUNTER — Encounter (HOSPITAL_COMMUNITY)
Admission: RE | Admit: 2021-06-11 | Discharge: 2021-06-11 | Disposition: A | Discharge status: Home / Self Care | Payer: Medicare PPO | Source: Ambulatory Visit | Attending: Nephrology | Admitting: Nephrology

## 2021-06-11 DIAGNOSIS — N185 Chronic kidney disease, stage 5: Secondary | ICD-10-CM | POA: Diagnosis present

## 2021-06-11 DIAGNOSIS — D631 Anemia in chronic kidney disease: Secondary | ICD-10-CM | POA: Diagnosis not present

## 2021-06-11 LAB — IRON AND TIBC
Iron: 74 ug/dL (ref 45–182)
Saturation Ratios: 34 % (ref 17.9–39.5)
TIBC: 218 ug/dL — ABNORMAL LOW (ref 250–450)
UIBC: 144 ug/dL

## 2021-06-11 LAB — POCT HEMOGLOBIN-HEMACUE: Hemoglobin: 9.8 g/dL — ABNORMAL LOW (ref 13.0–17.0)

## 2021-06-11 LAB — FERRITIN: Ferritin: 498 ng/mL — ABNORMAL HIGH (ref 24–336)

## 2021-06-11 MED ORDER — EPOETIN ALFA-EPBX 10000 UNIT/ML IJ SOLN
INTRAMUSCULAR | Status: AC
  Filled 2021-06-11: qty 1

## 2021-06-11 MED ORDER — EPOETIN ALFA-EPBX 10000 UNIT/ML IJ SOLN
10000.0000 [IU] | Freq: Once | INTRAMUSCULAR | Status: AC
  Administered 2021-06-11: 10000 [IU] via SUBCUTANEOUS

## 2021-06-19 ENCOUNTER — Encounter: Payer: Self-pay | Admitting: Cardiology

## 2021-06-19 ENCOUNTER — Other Ambulatory Visit: Payer: Self-pay

## 2021-06-19 ENCOUNTER — Ambulatory Visit: Payer: Medicare PPO | Admitting: Cardiology

## 2021-06-19 VITALS — BP 140/80 | HR 66 | Ht 68.0 in | Wt 232.8 lb

## 2021-06-19 DIAGNOSIS — I5022 Chronic systolic (congestive) heart failure: Secondary | ICD-10-CM

## 2021-06-19 MED ORDER — HYDRALAZINE HCL 25 MG PO TABS: 37.5000 mg | ORAL_TABLET | Freq: Two times a day (BID) | ORAL | 3 refills | Status: DC

## 2021-06-19 NOTE — Patient Instructions (Signed)
Medication Instructions:  INCREASE Hydralazine to 37.5 mg twice a day  Follow-Up: 3 months    If you need a refill on your cardiac medications before your next appointment, please call your pharmacy.

## 2021-06-19 NOTE — Progress Notes (Signed)
Clinical Summary Steven Perez is a 71 y.o.male seen today for follow up of the following medical problems.    1.Chronic combined systolic/diastolic HF - echocardiogram at [facility] 04/2018, his EF was 40% but no wall motion abnormalities and a Myoview was without ischemia - 08/2020 echo: LVEF 30-35%, global hypokinesis, grade III dd, normal RV, PASP 86    - increased LE edema recently. Chronic SOB - reports neprhology adjusted diuretic just last week, taking lasix 40mg  bid now - compliant with meds         2. CKD V - not on HD, though reports very borderline.  - followed at [facility] Dr Posey Pronto  - saw renal last week, preparing to start HD at home likely in early Spring he reports     3. 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    Past Medical History:  Diagnosis Date   Acute on chronic renal insufficiency    Anemia    assoc w/ chronic reanl failure   Arthralgia    CHF (congestive heart failure) (HCC)    a. EF 40% in 04/2018 and NST showing no ischemia b. EF at 25-30% in 11/2018 c. 30-35% in 08/2020   Chronic kidney disease    Stage 4   Diabetes mellitus    Dyslipidemia    Erectile dysfunction due to arterial insufficiency    HLD (hyperlipidemia)    Hypertension    Insomnia    Left renal mass    Obesity    Secondary hyperparathyroidism of renal origin ([place])    Type 2 diabetes mellitus (HCC)      Allergies  Allergen Reactions   Contrast Media [Iodinated Contrast Media] Other (See Comments)    Renal failure after administration of CT contrast     Tetanus Toxoids Swelling    "bad reaction;" told not to take this again     Current Outpatient Medications  Medication Sig Dispense Refill   carvedilol (COREG) 25 MG tablet Take 1 tablet (25 mg total) by mouth 2 (two) times daily with a meal. 180 tablet 3   doxazosin (CARDURA) 4 MG tablet Take 4 mg by  mouth at bedtime.     furosemide (LASIX) 40 MG tablet Take 40 mg by mouth 3 (three) times daily.     hydrALAZINE (APRESOLINE) 25 MG tablet Take 1 tablet (25 mg total) by mouth 2 (two) times daily. 180 tablet 3   insulin degludec (TRESIBA) 200 UNIT/ML FlexTouch Pen Inject 20 Units into the skin at bedtime.     isosorbide mononitrate (IMDUR) 30 MG 24 hr tablet Take 1 tablet (30 mg total) by mouth daily. 90 tablet 3   potassium chloride SA (K-DUR) 20 MEQ tablet Take 1 tablet (20 mEq total) by mouth daily. 30 tablet 2   pravastatin (PRAVACHOL) 20 MG tablet Take 20 mg by mouth at bedtime.     sodium bicarbonate 650 MG tablet Take 650 mg by mouth 3 (three) times daily.     traZODone (DESYREL) 100 MG tablet Take 100 mg by mouth at bedtime.     No current facility-administered medications for this visit.     Past Surgical History:  Procedure Laterality Date   AV FISTULA PLACEMENT Left 11/02/2016   Procedure: ARTERIOVENOUS (AV) FISTULA CREATION LEFT UPPER ARM;  Surgeon: Elam Dutch, MD;  Location: [facility];  Service: Vascular;  Laterality: Left;   IR  GENERIC HISTORICAL  06/04/2014   IR RADIOLOGIST EVAL & MGMT 06/04/2014 Aletta Edouard, MD [facility] INTERV RAD   IR GENERIC HISTORICAL  01/15/2016   IR RADIOLOGIST EVAL & MGMT 01/15/2016 [facility] INTERV RAD   IR RADIOLOGIST EVAL & MGMT  01/25/2017   IR RADIOLOGIST EVAL & MGMT  05/01/2019   RENAL BIOPSY     RENAL BIOPSY, PERCUTANEOUS     TONSILLECTOMY       Allergies  Allergen Reactions   Contrast Media [Iodinated Contrast Media] Other (See Comments)    Renal failure after administration of CT contrast     Tetanus Toxoids Swelling    "bad reaction;" told not to take this again      Family History  Problem Relation Age of Onset   Diabetes Mellitus II Mother    CAD Neg Hx    Stroke Neg Hx      Social History Mr. Linsley reports that he has quit smoking. He has never used smokeless tobacco. Mr. Kopera reports no history of alcohol use.   Review  of Systems CONSTITUTIONAL: No weight loss, fever, chills, weakness or fatigue.  HEENT: Eyes: No visual loss, blurred vision, double vision or yellow sclerae.No hearing loss, sneezing, congestion, runny nose or sore throat.  SKIN: No rash or itching.  CARDIOVASCULAR: per hpi RESPIRATORY: No shortness of breath, cough or sputum.  GASTROINTESTINAL: No anorexia, nausea, vomiting or diarrhea. No abdominal pain or blood.  GENITOURINARY: No burning on urination, no polyuria NEUROLOGICAL: No headache, dizziness, syncope, paralysis, ataxia, numbness or tingling in the extremities. No change in bowel or bladder control.  MUSCULOSKELETAL: No muscle, back pain, joint pain or stiffness.  LYMPHATICS: No enlarged nodes. No history of splenectomy.  PSYCHIATRIC: No history of depression or anxiety.  ENDOCRINOLOGIC: No reports of sweating, cold or heat intolerance. No polyuria or polydipsia.  Marland Kitchen   Physical Examination Today's Vitals   06/19/21 0821  BP: 140/80  Pulse: 66  SpO2: 99%  Weight: 232 lb 12.8 oz (105.6 kg)  Height: 5\' 8"  (1.727 m)   Body mass index is 35.4 kg/m.  Gen: resting comfortably, no acute distress HEENT: no scleral icterus, pupils equal round and reactive, no palptable cervical adenopathy,  CV: RRR, no m/r/g. Elevated JVD Resp: Clear to auscultation bilaterally GI: abdomen is soft, non-tender, non-distended, normal bowel sounds, no hepatosplenomegaly MSK: extremities are warm, 2+ bilateral LE edema Skin: warm, no rash Neuro:  no focal deficits Psych: appropriate affect   Diagnostic Studies     Assessment and Plan  Chronic systolic HF -chronic stbale symptoms - medical therapy limited by severe renal dysfunction - we will increase hydralazine to 37.5mg  bid - possible to start HD in spring it appears, would adjust meds at that time and consider cath            Arnoldo Lenis, M.D., F.A.C.C.

## 2021-07-02 ENCOUNTER — Encounter (HOSPITAL_COMMUNITY)
Admission: RE | Admit: 2021-07-02 | Discharge: 2021-07-02 | Disposition: A | Discharge status: Home / Self Care | Payer: Medicare PPO | Source: Ambulatory Visit | Attending: Nephrology | Admitting: Nephrology

## 2021-07-02 ENCOUNTER — Other Ambulatory Visit: Payer: Self-pay

## 2021-07-02 ENCOUNTER — Encounter (HOSPITAL_COMMUNITY): Payer: Self-pay

## 2021-07-02 DIAGNOSIS — D631 Anemia in chronic kidney disease: Secondary | ICD-10-CM | POA: Insufficient documentation

## 2021-07-02 DIAGNOSIS — N185 Chronic kidney disease, stage 5: Secondary | ICD-10-CM | POA: Diagnosis not present

## 2021-07-02 LAB — POCT HEMOGLOBIN-HEMACUE: Hemoglobin: 11 g/dL — ABNORMAL LOW (ref 13.0–17.0)

## 2021-07-02 MED ORDER — EPOETIN ALFA-EPBX 10000 UNIT/ML IJ SOLN
10000.0000 [IU] | Freq: Once | INTRAMUSCULAR | Status: AC
  Administered 2021-07-02: 10000 [IU] via SUBCUTANEOUS
  Filled 2021-07-02: qty 1

## 2021-07-23 ENCOUNTER — Encounter (HOSPITAL_COMMUNITY)
Admission: RE | Admit: 2021-07-23 | Discharge: 2021-07-23 | Disposition: A | Discharge status: Home / Self Care | Payer: Medicare PPO | Source: Ambulatory Visit | Attending: Nephrology | Admitting: Nephrology

## 2021-07-23 DIAGNOSIS — N185 Chronic kidney disease, stage 5: Secondary | ICD-10-CM | POA: Diagnosis not present

## 2021-07-23 LAB — FERRITIN: Ferritin: 607 ng/mL — ABNORMAL HIGH (ref 24–336)

## 2021-07-23 LAB — IRON AND TIBC
Iron: 55 ug/dL (ref 45–182)
Saturation Ratios: 25 % (ref 17.9–39.5)
TIBC: 217 ug/dL — ABNORMAL LOW (ref 250–450)
UIBC: 162 ug/dL

## 2021-07-23 LAB — POCT HEMOGLOBIN-HEMACUE: Hemoglobin: 10.6 g/dL — ABNORMAL LOW (ref 13.0–17.0)

## 2021-07-23 MED ORDER — EPOETIN ALFA-EPBX 10000 UNIT/ML IJ SOLN
10000.0000 [IU] | Freq: Once | INTRAMUSCULAR | Status: AC
  Administered 2021-07-23: 10000 [IU] via SUBCUTANEOUS
  Filled 2021-07-23: qty 1

## 2021-07-28 DIAGNOSIS — N185 Chronic kidney disease, stage 5: Secondary | ICD-10-CM | POA: Diagnosis not present

## 2021-08-03 NOTE — Progress Notes (Signed)
[facility] [place] ? ?ASSESSMENT / PLAN: ?Steven Perez is a 71 y.o. right handed male in need of permanent dialysis access. I reviewed options for dialysis in detail with the patient, including hemodialysis and peritoneal dialysis. I counseled the patient to ask their nephrologist about their candidacy for renal transplant. I counseled the patient that dialysis access requires surveillance and periodic maintenance. Plan to proceed with laparoscopic peritoneal dialysis catheter placement.  ? ?CHIEF COMPLAINT: Desires peritoneal dialysis ? ?HISTORY OF PRESENT ILLNESS: ?Steven Perez is a 70 y.o. male with rapidly progressive chronic kidney disease.  He had a brachiocephalic arteriovenous fistula created for him 11/02/2016 by Dr. Oneida Alar.  This is matured appropriately.  He has not yet needed to start dialysis.  Dr. Posey Pronto saw him recently and recommended starting dialysis soon.  The patient reviewed his options and decided he did not want to pursue hemodialysis, and preferred peritoneal dialysis.  Patient has never had any abdominal surgery before. ? ?Past Medical History:  ?Diagnosis Date  ? Acute on chronic renal insufficiency   ? Anemia   ? assoc w/ chronic reanl failure  ? Arthralgia   ? CHF (congestive heart failure) ([place])   ? a. EF 40% in 04/2018 and NST showing no ischemia b. EF at 25-30% in 11/2018 c. 30-35% in 08/2020  ? Chronic kidney disease   ? Stage 4  ? Diabetes mellitus   ? Dyslipidemia   ? 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 HLD (hyperlipidemia)   ? Hypertension   ? Insomnia   ? Left renal mass   ? Obesity   ? Secondary hyperparathyroidism of renal origin [facility])   ? Type 2 diabetes mellitus ([place])   ? ? ?Past Surgical History:  ?Procedure Laterality Date  ? AV FISTULA PLACEMENT Left 11/02/2016  ? Procedure: ARTERIOVENOUS (AV) FISTULA CREATION LEFT UPPER ARM;  Surgeon: Elam Dutch, MD;  Location: [place];  Service: Vascular;  Laterality: Left;  ? IR GENERIC  HISTORICAL  06/04/2014  ? IR RADIOLOGIST EVAL & MGMT 06/04/2014 Aletta Edouard, MD [facility] INTERV RAD  ? IR GENERIC HISTORICAL  01/15/2016  ? IR RADIOLOGIST EVAL & MGMT 01/15/2016 [facility] INTERV RAD  ? IR RADIOLOGIST EVAL & MGMT  01/25/2017  ? IR RADIOLOGIST EVAL & MGMT  05/01/2019  ? RENAL BIOPSY    ? RENAL BIOPSY, PERCUTANEOUS    ? TONSILLECTOMY    ? ? ?Family History  ?Problem Relation Age of Onset  ? Diabetes Mellitus II Mother   ? CAD Neg Hx   ? Stroke Neg Hx   ? ? ?Social History  ? ?Socioeconomic History  ? Marital status: Married  ?  Spouse name: Not on file  ? Number of children: Not on file  ? Years of education: Not on file  ? Highest education level: Not on file  ?Occupational History  ? Not on file  ?Tobacco Use  ? Smoking status: Former  ? Smokeless tobacco: Never  ?Vaping Use  ? Vaping Use: Never used  ?Substance and Sexual Activity  ? Alcohol use: No  ?  Comment: occasionally  ? Drug use: No  ? Sexual activity: Yes  ?Other Topics Concern  ? Not on file  ?Social History Narrative  ? Not on file  ? ?Social Determinants of Health  ? ?Financial Resource Strain: Not on file  ?Food Insecurity: Not on file  ?Transportation Needs: Not on file  ?Physical Activity: Not on file  ?Stress: Not on file  ?  Social Connections: Not on file  ?Intimate Partner Violence: Not on file  ? ? ?Allergies  ?Allergen Reactions  ? Contrast Media [Iodinated Contrast Media] Other (See Comments)  ?  Renal failure after administration of CT contrast    ? Tetanus Toxoids Swelling  ?  "bad reaction;" told not to take this again  ? ? ?Current Outpatient Medications  ?Medication Sig Dispense Refill  ? carvedilol (COREG) 25 MG tablet Take 1 tablet (25 mg total) by mouth 2 (two) times daily with a meal. 180 tablet 3  ? doxazosin (CARDURA) 4 MG tablet Take 4 mg by mouth at bedtime.    ? furosemide (LASIX) 40 MG tablet Take 40 mg by mouth 3 (three) times daily.    ? hydrALAZINE (APRESOLINE) 25 MG tablet Take 1.5 tablets (37.5 mg total) by mouth in the  morning and at bedtime. 270 tablet 3  ? insulin degludec (TRESIBA) 200 UNIT/ML FlexTouch Pen Inject 20 Units into the skin at bedtime.    ? pravastatin (PRAVACHOL) 20 MG tablet Take 20 mg by mouth at bedtime.    ? sodium bicarbonate 650 MG tablet Take 650 mg by mouth 3 (three) times daily.    ? traZODone (DESYREL) 100 MG tablet Take 100 mg by mouth at bedtime.    ? isosorbide mononitrate (IMDUR) 30 MG 24 hr tablet Take 1 tablet (30 mg total) by mouth daily. 90 tablet 3  ? potassium chloride SA (K-DUR) 20 MEQ tablet Take 1 tablet (20 mEq total) by mouth daily. 30 tablet 2  ? ?No current facility-administered medications for this visit.  ? ? ?PHYSICAL EXAM ?Vitals:  ? 08/04/21 1031  ?BP: (!) 163/80  ?Pulse: 75  ?Resp: 20  ?Temp: 98 ?F (36.7 ?C)  ?SpO2: 98%  ?Weight: 254 lb (115.2 kg)  ?Height: '5\' 8"'$  (1.727 m)  ? ? ?Constitutional: well appearing. no distress. Appears well nourished.  ?Neurologic: CN intact. no focal findings. no sensory loss. ?Psychiatric:  Mood and affect symmetric and appropriate. ?Eyes:  No icterus. No conjunctival pallor. ?Ears, nose, throat:  mucous membranes moist. Midline trachea.  ?Cardiac: regular rate and rhythm.  ?Respiratory:  unlabored. ?Abdominal:  soft, non-tender, non-distended.  ?Peripheral vascular: 2+ radial pulse; mature left brachiocephalic fistula with good thrill ?Extremity: 2+ pitting pretibial edema. no cyanosis. no pallor.  ?Skin: no gangrene. no ulceration.  ?Lymphatic: no Stemmer's sign. no palpable lymphadenopathy. ? ?PERTINENT LABORATORY AND RADIOLOGIC DATA ? ?Most recent CBC ?CBC Latest Ref Rng & Units 07/23/2021 07/02/2021 06/11/2021  ?WBC 4.0 - 10.5 K/uL - - -  ?Hemoglobin 13.0 - 17.0 g/dL 10.6(L) 11.0(L) 9.8(L)  ?Hematocrit 39.0 - 52.0 % - - -  ?Platelets 150 - 400 K/uL - - -  ?  ? ?Most recent CMP ?CMP Latest Ref Rng & Units 04/20/2019 03/23/2019 03/23/2019  ?Glucose 70 - 99 mg/dL 82 89 -  ?BUN 8 - 23 mg/dL 51(H) 49(H) -  ?Creatinine 0.61 - 1.24 mg/dL 5.00(H) 4.69(H) -   ?Sodium 135 - 145 mmol/L 139 138 -  ?Potassium 3.5 - 5.1 mmol/L 3.9 4.2 -  ?Chloride 98 - 111 mmol/L 105 105 -  ?CO2 22 - 32 mmol/L 22 23 -  ?Calcium 8.9 - 10.3 mg/dL 8.7(L) 8.9 9.4  ?Total Protein 6.5 - 8.1 g/dL - - -  ?Total Bilirubin 0.3 - 1.2 mg/dL - - -  ?Alkaline Phos 38 - 126 U/L - - -  ?AST 15 - 41 U/L - - -  ?ALT 0 - 44 U/L - - -  ? ? ?  Yevonne Aline. Stanford Breed, MD ?[facility] [place] ?Office Phone Number: [phone] ?08/04/2021 10:54 AM ? ?Total time spent on preparing this encounter including chart review, data review, collecting history, examining the patient, coordinating care for this new patient, 45 minutes. ? ?Portions of this report may have been transcribed using voice recognition software.  Every effort has been made to ensure accuracy; however, inadvertent computerized transcription errors may still be present. ? ? ? ?

## 2021-08-03 NOTE — H&P (View-Only) (Signed)
[facility] [place] ? ?ASSESSMENT / PLAN: ?Steven Perez is a 71 y.o. right handed male in need of permanent dialysis access. I reviewed options for dialysis in detail with the patient, including hemodialysis and peritoneal dialysis. I counseled the patient to ask their nephrologist about their candidacy for renal transplant. I counseled the patient that dialysis access requires surveillance and periodic maintenance. Plan to proceed with laparoscopic peritoneal dialysis catheter placement.  ? ?CHIEF COMPLAINT: Desires peritoneal dialysis ? ?HISTORY OF PRESENT ILLNESS: ?Steven Perez is a 71 y.o. male with rapidly progressive chronic kidney disease.  He had a brachiocephalic arteriovenous fistula created for him 11/02/2016 by Dr. Oneida Alar.  This is matured appropriately.  He has not yet needed to start dialysis.  Dr. Posey Pronto saw him recently and recommended starting dialysis soon.  The patient reviewed his options and decided he did not want to pursue hemodialysis, and preferred peritoneal dialysis.  Patient has never had any abdominal surgery before. ? ?Past Medical History:  ?Diagnosis Date  ? Acute on chronic renal insufficiency   ? Anemia   ? assoc w/ chronic reanl failure  ? Arthralgia   ? CHF (congestive heart failure) ([place])   ? a. EF 40% in 04/2018 and NST showing no ischemia b. EF at 25-30% in 11/2018 c. 30-35% in 08/2020  ? Chronic kidney disease   ? Stage 4  ? Diabetes mellitus   ? Dyslipidemia   ? 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 HLD (hyperlipidemia)   ? Hypertension   ? Insomnia   ? Left renal mass   ? Obesity   ? Secondary hyperparathyroidism of renal origin [facility])   ? Type 2 diabetes mellitus ([place])   ? ? ?Past Surgical History:  ?Procedure Laterality Date  ? AV FISTULA PLACEMENT Left 11/02/2016  ? Procedure: ARTERIOVENOUS (AV) FISTULA CREATION LEFT UPPER ARM;  Surgeon: Elam Dutch, MD;  Location: [place];  Service: Vascular;  Laterality: Left;  ? IR GENERIC  HISTORICAL  06/04/2014  ? IR RADIOLOGIST EVAL & MGMT 06/04/2014 Aletta Edouard, MD [facility] INTERV RAD  ? IR GENERIC HISTORICAL  01/15/2016  ? IR RADIOLOGIST EVAL & MGMT 01/15/2016 [facility] INTERV RAD  ? IR RADIOLOGIST EVAL & MGMT  01/25/2017  ? IR RADIOLOGIST EVAL & MGMT  05/01/2019  ? RENAL BIOPSY    ? RENAL BIOPSY, PERCUTANEOUS    ? TONSILLECTOMY    ? ? ?Family History  ?Problem Relation Age of Onset  ? Diabetes Mellitus II Mother   ? CAD Neg Hx   ? Stroke Neg Hx   ? ? ?Social History  ? ?Socioeconomic History  ? Marital status: Married  ?  Spouse name: Not on file  ? Number of children: Not on file  ? Years of education: Not on file  ? Highest education level: Not on file  ?Occupational History  ? Not on file  ?Tobacco Use  ? Smoking status: Former  ? Smokeless tobacco: Never  ?Vaping Use  ? Vaping Use: Never used  ?Substance and Sexual Activity  ? Alcohol use: No  ?  Comment: occasionally  ? Drug use: No  ? Sexual activity: Yes  ?Other Topics Concern  ? Not on file  ?Social History Narrative  ? Not on file  ? ?Social Determinants of Health  ? ?Financial Resource Strain: Not on file  ?Food Insecurity: Not on file  ?Transportation Needs: Not on file  ?Physical Activity: Not on file  ?Stress: Not on file  ?  Social Connections: Not on file  ?Intimate Partner Violence: Not on file  ? ? ?Allergies  ?Allergen Reactions  ? Contrast Media [Iodinated Contrast Media] Other (See Comments)  ?  Renal failure after administration of CT contrast    ? Tetanus Toxoids Swelling  ?  "bad reaction;" told not to take this again  ? ? ?Current Outpatient Medications  ?Medication Sig Dispense Refill  ? carvedilol (COREG) 25 MG tablet Take 1 tablet (25 mg total) by mouth 2 (two) times daily with a meal. 180 tablet 3  ? doxazosin (CARDURA) 4 MG tablet Take 4 mg by mouth at bedtime.    ? furosemide (LASIX) 40 MG tablet Take 40 mg by mouth 3 (three) times daily.    ? hydrALAZINE (APRESOLINE) 25 MG tablet Take 1.5 tablets (37.5 mg total) by mouth in the  morning and at bedtime. 270 tablet 3  ? insulin degludec (TRESIBA) 200 UNIT/ML FlexTouch Pen Inject 20 Units into the skin at bedtime.    ? pravastatin (PRAVACHOL) 20 MG tablet Take 20 mg by mouth at bedtime.    ? sodium bicarbonate 650 MG tablet Take 650 mg by mouth 3 (three) times daily.    ? traZODone (DESYREL) 100 MG tablet Take 100 mg by mouth at bedtime.    ? isosorbide mononitrate (IMDUR) 30 MG 24 hr tablet Take 1 tablet (30 mg total) by mouth daily. 90 tablet 3  ? potassium chloride SA (K-DUR) 20 MEQ tablet Take 1 tablet (20 mEq total) by mouth daily. 30 tablet 2  ? ?No current facility-administered medications for this visit.  ? ? ?PHYSICAL EXAM ?Vitals:  ? 08/04/21 1031  ?BP: (!) 163/80  ?Pulse: 75  ?Resp: 20  ?Temp: 98 ?F (36.7 ?C)  ?SpO2: 98%  ?Weight: 254 lb (115.2 kg)  ?Height: '5\' 8"'$  (1.727 m)  ? ? ?Constitutional: well appearing. no distress. Appears well nourished.  ?Neurologic: CN intact. no focal findings. no sensory loss. ?Psychiatric:  Mood and affect symmetric and appropriate. ?Eyes:  No icterus. No conjunctival pallor. ?Ears, nose, throat:  mucous membranes moist. Midline trachea.  ?Cardiac: regular rate and rhythm.  ?Respiratory:  unlabored. ?Abdominal:  soft, non-tender, non-distended.  ?Peripheral vascular: 2+ radial pulse; mature left brachiocephalic fistula with good thrill ?Extremity: 2+ pitting pretibial edema. no cyanosis. no pallor.  ?Skin: no gangrene. no ulceration.  ?Lymphatic: no Stemmer's sign. no palpable lymphadenopathy. ? ?PERTINENT LABORATORY AND RADIOLOGIC DATA ? ?Most recent CBC ?CBC Latest Ref Rng & Units 07/23/2021 07/02/2021 06/11/2021  ?WBC 4.0 - 10.5 K/uL - - -  ?Hemoglobin 13.0 - 17.0 g/dL 10.6(L) 11.0(L) 9.8(L)  ?Hematocrit 39.0 - 52.0 % - - -  ?Platelets 150 - 400 K/uL - - -  ?  ? ?Most recent CMP ?CMP Latest Ref Rng & Units 04/20/2019 03/23/2019 03/23/2019  ?Glucose 70 - 99 mg/dL 82 89 -  ?BUN 8 - 23 mg/dL 51(H) 49(H) -  ?Creatinine 0.61 - 1.24 mg/dL 5.00(H) 4.69(H) -   ?Sodium 135 - 145 mmol/L 139 138 -  ?Potassium 3.5 - 5.1 mmol/L 3.9 4.2 -  ?Chloride 98 - 111 mmol/L 105 105 -  ?CO2 22 - 32 mmol/L 22 23 -  ?Calcium 8.9 - 10.3 mg/dL 8.7(L) 8.9 9.4  ?Total Protein 6.5 - 8.1 g/dL - - -  ?Total Bilirubin 0.3 - 1.2 mg/dL - - -  ?Alkaline Phos 38 - 126 U/L - - -  ?AST 15 - 41 U/L - - -  ?ALT 0 - 44 U/L - - -  ? ? ?  Yevonne Aline. Stanford Breed, MD ?[facility] [place] ?Office Phone Number: [phone] ?08/04/2021 10:54 AM ? ?Total time spent on preparing this encounter including chart review, data review, collecting history, examining the patient, coordinating care for this new patient, 45 minutes. ? ?Portions of this report may have been transcribed using voice recognition software.  Every effort has been made to ensure accuracy; however, inadvertent computerized transcription errors may still be present. ? ? ? ?

## 2021-08-04 ENCOUNTER — Encounter: Payer: Self-pay | Admitting: Vascular Surgery

## 2021-08-04 ENCOUNTER — Other Ambulatory Visit: Payer: Self-pay

## 2021-08-04 ENCOUNTER — Ambulatory Visit: Payer: Medicare PPO | Admitting: Vascular Surgery

## 2021-08-04 VITALS — BP 163/80 | HR 75 | Temp 98.0°F | Resp 20 | Ht 68.0 in | Wt 254.0 lb

## 2021-08-04 DIAGNOSIS — N185 Chronic kidney disease, stage 5: Secondary | ICD-10-CM

## 2021-08-07 ENCOUNTER — Other Ambulatory Visit: Payer: Self-pay

## 2021-08-07 ENCOUNTER — Encounter (HOSPITAL_COMMUNITY): Payer: Self-pay | Admitting: Vascular Surgery

## 2021-08-07 DIAGNOSIS — D631 Anemia in chronic kidney disease: Secondary | ICD-10-CM | POA: Diagnosis not present

## 2021-08-07 DIAGNOSIS — N185 Chronic kidney disease, stage 5: Secondary | ICD-10-CM | POA: Diagnosis not present

## 2021-08-07 DIAGNOSIS — I120 Hypertensive chronic kidney disease with stage 5 chronic kidney disease or end stage renal disease: Secondary | ICD-10-CM | POA: Diagnosis not present

## 2021-08-07 DIAGNOSIS — N2581 Secondary hyperparathyroidism of renal origin: Secondary | ICD-10-CM | POA: Diagnosis not present

## 2021-08-07 NOTE — Progress Notes (Signed)
Spoke with Steven Perez for pre-op call. Steven Perez has hx of CHF and sees Dr. Harl Bowie. Most recent visit was 02/20/21. Steven Perez has CKD and is a type 2 Diabetic. He states he does not check his blood sugar at home and he can't remember what his last A1C was about 4 months ago. I have called Dr. Nolon Rod office for that result. Instructed Steven Perez to take 1/2 of his regular dose of Tresiba Insulin Sunday PM, he will take 10 units.  ? ?Steven Perez's surgery is scheduled as ambulatory so no Covid test is required prior to surgery. ? ? ?

## 2021-08-09 ENCOUNTER — Encounter (HOSPITAL_COMMUNITY): Payer: Self-pay | Admitting: Vascular Surgery

## 2021-08-09 NOTE — Anesthesia Preprocedure Evaluation (Addendum)
Anesthesia Evaluation  ?Patient identified by MRN, date of birth, ID band ?Patient awake ? ? ? ?Reviewed: ?Allergy & Precautions, NPO status , Patient's Chart, lab work & pertinent test results, reviewed documented beta blocker date and time  ? ?Airway ?Mallampati: II ? ?TM Distance: >3 FB ?Neck ROM: Full ? ? ? Dental ?no notable dental hx. ?(+) Teeth Intact, Caps, Dental Advisory Given ?  ?Pulmonary ?former smoker,  ?  ?Pulmonary exam normal ?breath sounds clear to auscultation ? ? ? ? ? ? Cardiovascular ?hypertension, Pt. on medications and Pt. on home beta blockers ?+CHF  ?Normal cardiovascular exam ?Rhythm:Regular Rate:Normal ? ?Echo 09/04/20 ?1. Left ventricular ejection fraction, by estimation, is 30 to 35%. The left ventricle has moderately decreased function. The left ventricle demonstrates global hypokinesis. The left ventricular internal cavity size  ?was mildly dilated. There is mild left ventricular hypertrophy. Left ventricular diastolic parameters are  ?consistent with Grade III diastolic dysfunction (restrictive). Elevated left atrial pressure.  ??2. Right ventricular systolic function is normal. The right ventricular size is normal. There is severely elevated pulmonary artery systolic pressure.  ??3. Left atrial size was severely dilated.  ??4. Right atrial size was severely dilated.  ??5. The mitral valve is normal in structure. Moderate mitral valve regurgitation. No evidence of mitral stenosis.  ??6. Tricuspid valve regurgitation is moderate.  ??7. The aortic valve is tricuspid. There is mild calcification of the aortic valve. There is mild thickening of the aortic valve. Aortic valve regurgitation is not visualized. No aortic stenosis is present.  ??8. Severe pulmonary HTN, PASP is 86 mmHg.  ??9. The inferior vena cava is dilated in size with <50% respiratory variability, suggesting right atrial pressure of 15 mmHg.  ? ?EKG 08/18/20 ?NSR, prolonged QT, occasional  PVC ?  ?Neuro/Psych ?negative neurological ROS ? negative psych ROS  ? GI/Hepatic ?negative GI ROS, Neg liver ROS,   ?Endo/Other  ?diabetes, Well Controlled, Type 2, Insulin DependentObesity ?Hyperlipidemia ?Secondary hyperparathyroidism ? Renal/GU ?ESRFRenal diseaseIn need of dialysis  ?negative genitourinary ?  ?Musculoskeletal ?negative musculoskeletal ROS ?(+)  ? Abdominal ?(+) + obese,   ?Peds ? Hematology ? ?(+) Blood dyscrasia, anemia ,   ?Anesthesia Other Findings ? ? Reproductive/Obstetrics ?ED ? ?  ? ? ? ? ? ? ? ? ? ? ? ? ? ?  ?  ? ? ? ? ? ? ? ?Anesthesia Physical ?Anesthesia Plan ? ?ASA: 3 ? ?Anesthesia Plan: General  ? ?Post-op Pain Management: Minimal or no pain anticipated and Dilaudid IV  ? ?Induction: Intravenous ? ?PONV Risk Score and Plan: 3 and Treatment may vary due to age or medical condition ? ?Airway Management Planned: Oral ETT ? ?Additional Equipment: None ? ?Intra-op Plan:  ? ?Post-operative Plan: Extubation in OR ? ?Informed Consent: I have reviewed the patients History and Physical, chart, labs and discussed the procedure including the risks, benefits and alternatives for the proposed anesthesia with the patient or authorized representative who has indicated his/her understanding and acceptance.  ? ? ? ?Dental advisory given ? ?Plan Discussed with: CRNA and Anesthesiologist ? ?Anesthesia Plan Comments:   ? ? ? ? ? ?Anesthesia Quick Evaluation ? ?

## 2021-08-10 ENCOUNTER — Encounter (HOSPITAL_COMMUNITY): Payer: Self-pay | Admitting: Vascular Surgery

## 2021-08-10 ENCOUNTER — Other Ambulatory Visit: Payer: Self-pay

## 2021-08-10 ENCOUNTER — Ambulatory Visit (HOSPITAL_COMMUNITY)
Admission: RE | Admit: 2021-08-10 | Discharge: 2021-08-10 | Disposition: A | Discharge status: Home / Self Care | Payer: Medicare PPO | Source: Ambulatory Visit | Attending: Vascular Surgery | Admitting: Vascular Surgery

## 2021-08-10 ENCOUNTER — Encounter (HOSPITAL_COMMUNITY)
Admission: RE | Disposition: A | Discharge status: Home / Self Care | Payer: Self-pay | Source: Ambulatory Visit | Attending: Vascular Surgery

## 2021-08-10 ENCOUNTER — Ambulatory Visit (HOSPITAL_BASED_OUTPATIENT_CLINIC_OR_DEPARTMENT_OTHER): Payer: Medicare PPO | Admitting: Anesthesiology

## 2021-08-10 ENCOUNTER — Ambulatory Visit (HOSPITAL_COMMUNITY): Payer: Medicare PPO | Admitting: Anesthesiology

## 2021-08-10 DIAGNOSIS — I509 Heart failure, unspecified: Secondary | ICD-10-CM

## 2021-08-10 DIAGNOSIS — D631 Anemia in chronic kidney disease: Secondary | ICD-10-CM | POA: Diagnosis not present

## 2021-08-10 DIAGNOSIS — E785 Hyperlipidemia, unspecified: Secondary | ICD-10-CM | POA: Diagnosis not present

## 2021-08-10 DIAGNOSIS — Z794 Long term (current) use of insulin: Secondary | ICD-10-CM

## 2021-08-10 DIAGNOSIS — Z87891 Personal history of nicotine dependence: Secondary | ICD-10-CM | POA: Insufficient documentation

## 2021-08-10 DIAGNOSIS — I132 Hypertensive heart and chronic kidney disease with heart failure and with stage 5 chronic kidney disease, or end stage renal disease: Secondary | ICD-10-CM | POA: Insufficient documentation

## 2021-08-10 DIAGNOSIS — Z992 Dependence on renal dialysis: Secondary | ICD-10-CM | POA: Diagnosis not present

## 2021-08-10 DIAGNOSIS — E1122 Type 2 diabetes mellitus with diabetic chronic kidney disease: Secondary | ICD-10-CM

## 2021-08-10 DIAGNOSIS — E669 Obesity, unspecified: Secondary | ICD-10-CM | POA: Diagnosis not present

## 2021-08-10 DIAGNOSIS — N186 End stage renal disease: Secondary | ICD-10-CM | POA: Insufficient documentation

## 2021-08-10 DIAGNOSIS — N185 Chronic kidney disease, stage 5: Secondary | ICD-10-CM | POA: Diagnosis not present

## 2021-08-10 DIAGNOSIS — Z7984 Long term (current) use of oral hypoglycemic drugs: Secondary | ICD-10-CM

## 2021-08-10 HISTORY — DX: Personal history of urinary calculi: Z87.442

## 2021-08-10 LAB — GLUCOSE, CAPILLARY
Glucose-Capillary: 76 mg/dL (ref 70–99)
Glucose-Capillary: 69 mg/dL — ABNORMAL LOW (ref 70–99)

## 2021-08-10 LAB — POCT I-STAT, CHEM 8
BUN: 63 mg/dL — ABNORMAL HIGH (ref 8–23)
Calcium, Ion: 1.11 mmol/L — ABNORMAL LOW (ref 1.15–1.40)
Chloride: 107 mmol/L (ref 98–111)
Creatinine, Ser: 10.5 mg/dL — ABNORMAL HIGH (ref 0.61–1.24)
Glucose, Bld: 67 mg/dL — ABNORMAL LOW (ref 70–99)
HCT: 33 % — ABNORMAL LOW (ref 39.0–52.0)
Hemoglobin: 11.2 g/dL — ABNORMAL LOW (ref 13.0–17.0)
Potassium: 3.9 mmol/L (ref 3.5–5.1)
Sodium: 143 mmol/L (ref 135–145)
TCO2: 24 mmol/L (ref 22–32)

## 2021-08-10 SURGERY — LAPAROSCOPIC INSERTION CONTINUOUS AMBULATORY PERITONEAL DIALYSIS  (CAPD) CATHETER
Anesthesia: General | Site: Abdomen

## 2021-08-10 MED ORDER — FENTANYL CITRATE (PF) 100 MCG/2ML IJ SOLN
INTRAMUSCULAR | Status: DC | PRN
  Administered 2021-08-10: 100 ug via INTRAVENOUS

## 2021-08-10 MED ORDER — SODIUM CHLORIDE 0.9 % IV SOLN: INTRAVENOUS | Status: DC

## 2021-08-10 MED ORDER — OXYCODONE HCL 5 MG PO TABS
ORAL_TABLET | ORAL | Status: AC
  Filled 2021-08-10: qty 1

## 2021-08-10 MED ORDER — CHLORHEXIDINE GLUCONATE 4 % EX LIQD: 60.0000 mL | Freq: Once | CUTANEOUS | Status: DC

## 2021-08-10 MED ORDER — PROPOFOL 10 MG/ML IV BOLUS
INTRAVENOUS | Status: DC | PRN
  Administered 2021-08-10: 100 mg via INTRAVENOUS

## 2021-08-10 MED ORDER — ROCURONIUM BROMIDE 10 MG/ML (PF) SYRINGE
PREFILLED_SYRINGE | INTRAVENOUS | Status: DC | PRN
  Administered 2021-08-10: 10 mg via INTRAVENOUS
  Administered 2021-08-10: 50 mg via INTRAVENOUS

## 2021-08-10 MED ORDER — CHLORHEXIDINE GLUCONATE 0.12 % MT SOLN
15.0000 mL | Freq: Once | OROMUCOSAL | Status: AC
Start: 2021-08-10 — End: 2021-08-10
  Administered 2021-08-10: 15 mL via OROMUCOSAL
  Filled 2021-08-10: qty 15

## 2021-08-10 MED ORDER — FENTANYL CITRATE (PF) 100 MCG/2ML IJ SOLN: 25.0000 ug | INTRAMUSCULAR | Status: DC | PRN

## 2021-08-10 MED ORDER — LIDOCAINE 2% (20 MG/ML) 5 ML SYRINGE
INTRAMUSCULAR | Status: DC | PRN
  Administered 2021-08-10: 60 mg via INTRAVENOUS

## 2021-08-10 MED ORDER — CEFAZOLIN SODIUM-DEXTROSE 2-4 GM/100ML-% IV SOLN
2.0000 g | INTRAVENOUS | Status: AC
  Administered 2021-08-10: 2 g via INTRAVENOUS

## 2021-08-10 MED ORDER — OXYCODONE HCL 5 MG PO TABS
5.0000 mg | ORAL_TABLET | Freq: Once | ORAL | Status: AC | PRN
  Administered 2021-08-10: 5 mg via ORAL

## 2021-08-10 MED ORDER — HEPARIN 6000 UNIT IRRIGATION SOLUTION
Status: DC | PRN
  Administered 2021-08-10: 1

## 2021-08-10 MED ORDER — HEPARIN 6000 UNIT IRRIGATION SOLUTION
Status: AC
  Filled 2021-08-10: qty 500

## 2021-08-10 MED ORDER — OXYCODONE HCL 5 MG/5ML PO SOLN: 5.0000 mg | Freq: Once | ORAL | Status: AC | PRN

## 2021-08-10 MED ORDER — SUGAMMADEX SODIUM 500 MG/5ML IV SOLN
INTRAVENOUS | Status: DC | PRN
Start: 2021-08-10 — End: 2021-08-10
  Administered 2021-08-10: 500 mg via INTRAVENOUS

## 2021-08-10 MED ORDER — ONDANSETRON HCL 4 MG/2ML IJ SOLN: 4.0000 mg | Freq: Once | INTRAMUSCULAR | Status: DC | PRN

## 2021-08-10 MED ORDER — CHLORHEXIDINE GLUCONATE 4 % EX LIQD
60.0000 mL | Freq: Once | CUTANEOUS | Status: DC
Start: 2021-08-10 — End: 2021-08-10

## 2021-08-10 MED ORDER — FENTANYL CITRATE (PF) 250 MCG/5ML IJ SOLN
INTRAMUSCULAR | Status: AC
  Filled 2021-08-10: qty 5

## 2021-08-10 MED ORDER — OXYCODONE-ACETAMINOPHEN 5-325 MG PO TABS: 1.0000 | ORAL_TABLET | Freq: Four times a day (QID) | ORAL | 0 refills | Status: AC | PRN

## 2021-08-10 MED ORDER — SODIUM CHLORIDE 0.9 % IR SOLN
Status: DC | PRN
  Administered 2021-08-10: 1000 mL

## 2021-08-10 MED ORDER — PHENYLEPHRINE HCL-NACL 20-0.9 MG/250ML-% IV SOLN
INTRAVENOUS | Status: DC | PRN
  Administered 2021-08-10: 25 ug/min via INTRAVENOUS

## 2021-08-10 MED ORDER — ONDANSETRON HCL 4 MG/2ML IJ SOLN
INTRAMUSCULAR | Status: DC | PRN
  Administered 2021-08-10: 4 mg via INTRAVENOUS

## 2021-08-10 MED ORDER — INSULIN ASPART 100 UNIT/ML IJ SOLN: 0.0000 [IU] | INTRAMUSCULAR | Status: DC | PRN

## 2021-08-10 MED ORDER — PROPOFOL 10 MG/ML IV BOLUS
INTRAVENOUS | Status: AC
  Filled 2021-08-10: qty 20

## 2021-08-10 SURGICAL SUPPLY — 50 items
ADAPTER TITANIUM MEDIONICS (MISCELLANEOUS) ×3 IMPLANT
ADH SKN CLS APL DERMABOND .7 (GAUZE/BANDAGES/DRESSINGS) ×2
ADH SKN CLS LQ APL DERMABOND (GAUZE/BANDAGES/DRESSINGS) ×2
ADPR DLYS CATH STRL LF DISP (MISCELLANEOUS) ×2
APL PRP STRL LF DISP 70% ISPRP (MISCELLANEOUS) ×2
BAG DECANTER FOR FLEXI CONT (MISCELLANEOUS) ×3 IMPLANT
BIOPATCH RED 1 DISK 7.0 (GAUZE/BANDAGES/DRESSINGS) ×3 IMPLANT
BLADE 11 SAFETY STRL DISP (BLADE) ×3 IMPLANT
CATH EXTENDED DIALYSIS (CATHETERS) ×2 IMPLANT
CHLORAPREP W/TINT 26 (MISCELLANEOUS) ×3 IMPLANT
COVER SURGICAL LIGHT HANDLE (MISCELLANEOUS) ×3 IMPLANT
DECANTER SPIKE VIAL GLASS SM (MISCELLANEOUS) ×3 IMPLANT
DERMABOND ADHESIVE PROPEN (GAUZE/BANDAGES/DRESSINGS) ×1
DERMABOND ADVANCED (GAUZE/BANDAGES/DRESSINGS) ×1
DERMABOND ADVANCED .7 DNX12 (GAUZE/BANDAGES/DRESSINGS) ×2 IMPLANT
DERMABOND ADVANCED .7 DNX6 (GAUZE/BANDAGES/DRESSINGS) ×1 IMPLANT
ELECT REM PT RETURN 9FT ADLT (ELECTROSURGICAL) ×3
ELECTRODE REM PT RTRN 9FT ADLT (ELECTROSURGICAL) ×2 IMPLANT
GAUZE SPONGE 4X4 12PLY STRL (GAUZE/BANDAGES/DRESSINGS) ×3 IMPLANT
GLOVE SURG POLYISO LF SZ8 (GLOVE) ×3 IMPLANT
GOWN STRL REUS W/ TWL LRG LVL3 (GOWN DISPOSABLE) ×6 IMPLANT
GOWN STRL REUS W/ TWL XL LVL3 (GOWN DISPOSABLE) ×2 IMPLANT
GOWN STRL REUS W/TWL LRG LVL3 (GOWN DISPOSABLE) ×9
GOWN STRL REUS W/TWL XL LVL3 (GOWN DISPOSABLE) ×3
GRASPER SUT TROCAR 14GX15 (MISCELLANEOUS) ×3 IMPLANT
IV NS 1000ML (IV SOLUTION) ×3
IV NS 1000ML BAXH (IV SOLUTION) ×2 IMPLANT
KIT BASIN OR (CUSTOM PROCEDURE TRAY) ×3 IMPLANT
KIT TURNOVER KIT B (KITS) ×3 IMPLANT
NDL INSUFFLATION 14GA 120MM (NEEDLE) ×1 IMPLANT
NEEDLE INSUFFLATION 14GA 120MM (NEEDLE) ×3 IMPLANT
NS IRRIG 1000ML POUR BTL (IV SOLUTION) ×3 IMPLANT
PAD ARMBOARD 7.5X6 YLW CONV (MISCELLANEOUS) ×6 IMPLANT
SCISSORS LAP 5X35 DISP (ENDOMECHANICALS) IMPLANT
SET CYSTO W/LG BORE CLAMP LF (SET/KITS/TRAYS/PACK) ×3 IMPLANT
SET TUBE SMOKE EVAC HIGH FLOW (TUBING) ×3 IMPLANT
SLEEVE ENDOPATH XCEL 5M (ENDOMECHANICALS) ×3 IMPLANT
STYLET FALLER (MISCELLANEOUS) ×3 IMPLANT
SUT MNCRL AB 4-0 PS2 18 (SUTURE) ×6 IMPLANT
SUT PROLENE 0 SH 30 (SUTURE) ×6 IMPLANT
SUT VIC AB 3-0 SH 27 (SUTURE)
SUT VIC AB 3-0 SH 27X BRD (SUTURE) IMPLANT
SYR 20CC LL (SYRINGE) ×3 IMPLANT
TAPE CLOTH SURG 4X10 WHT LF (GAUZE/BANDAGES/DRESSINGS) ×3 IMPLANT
TOWEL GREEN STERILE (TOWEL DISPOSABLE) ×3 IMPLANT
TOWEL GREEN STERILE FF (TOWEL DISPOSABLE) ×3 IMPLANT
TRAY LAPAROSCOPIC MC (CUSTOM PROCEDURE TRAY) ×3 IMPLANT
TROCAR 5MMX150MM (TROCAR) ×3 IMPLANT
TROCAR XCEL NON-BLD 5MMX100MML (ENDOMECHANICALS) ×3 IMPLANT
WATER STERILE IRR 1000ML POUR (IV SOLUTION) ×3 IMPLANT

## 2021-08-10 NOTE — Interval H&P Note (Signed)
History and Physical Interval Note: ? ?08/10/2021 ?7:16 AM ? ?Steven Perez  has presented today for surgery, with the diagnosis of ESRD.  The various methods of treatment have been discussed with the patient and family. After consideration of risks, benefits and other options for treatment, the patient has consented to  Procedure(s): ?LAPAROSCOPIC INSERTION CONTINUOUS AMBULATORY PERITONEAL DIALYSIS  (CAPD) CATHETER (N/A) ?possible, LAPAROSCOPIC OMENTOPEXY/ (N/A) as a surgical intervention.  The patient's history has been reviewed, patient examined, no change in status, stable for surgery.  I have reviewed the patient's chart and labs.  Questions were answered to the patient's satisfaction.   ? ? ?[name] ? ? ?

## 2021-08-10 NOTE — Anesthesia Postprocedure Evaluation (Signed)
Anesthesia Post Note ? ?Patient: Steven Perez ? ?Procedure(s) Performed: LAPAROSCOPIC INSERTION CONTINUOUS AMBULATORY PERITONEAL DIALYSIS  (CAPD) CATHETER WITH OMENTAPEXY (Abdomen) ? ?  ? ?Patient location during evaluation: PACU ?Anesthesia Type: General ?Level of consciousness: awake and alert and oriented ?Pain management: pain level controlled ?Vital Signs Assessment: post-procedure vital signs reviewed and stable ?Respiratory status: spontaneous breathing, nonlabored ventilation and respiratory function stable ?Cardiovascular status: blood pressure returned to baseline and stable ?Postop Assessment: no apparent nausea or vomiting ?Anesthetic complications: no ? ? ?No notable events documented. ? ?Last Vitals:  ?Vitals:  ? 08/10/21 0905 08/10/21 0920  ?BP: 132/76 (!) 142/68  ?Pulse: (!) 55 (!) 56  ?Resp: 16 12  ?Temp:  (!) 36.1 ?C  ?SpO2: 95% 96%  ?  ?Last Pain:  ?Vitals:  ? 08/10/21 0920  ?TempSrc:   ?PainSc: 6   ? ? ?  ?  ?  ?  ?  ?  ? ?Freddi Schrager A. ? ? ? ? ?

## 2021-08-10 NOTE — Anesthesia Procedure Notes (Signed)
Procedure Name: Intubation ?Date/Time: 08/10/2021 7:45 AM ?Performed by: Georgia Duff, CRNA ?Pre-anesthesia Checklist: Patient identified, Emergency Drugs available, Suction available and Patient being monitored ?Patient Re-evaluated:Patient Re-evaluated prior to induction ?Oxygen Delivery Method: Circle System Utilized ?Preoxygenation: Pre-oxygenation with 100% oxygen ?Induction Type: IV induction ?Ventilation: Mask ventilation without difficulty ?Laryngoscope Size: Sabra Heck and 3 ?Grade View: Grade I ?Tube type: Oral ?Tube size: 7.5 mm ?Number of attempts: 1 ?Airway Equipment and Method: Stylet and Oral airway ?Placement Confirmation: ETT inserted through vocal cords under direct vision, positive ETCO2 and breath sounds checked- equal and bilateral ?Secured at: 21 cm ?Tube secured with: Tape ?Dental Injury: Teeth and Oropharynx as per pre-operative assessment  ? ? ? ? ?

## 2021-08-10 NOTE — Transfer of Care (Signed)
Immediate Anesthesia Transfer of Care Note ? ?Patient: Steven Perez ? ?Procedure(s) Performed: LAPAROSCOPIC INSERTION CONTINUOUS AMBULATORY PERITONEAL DIALYSIS  (CAPD) CATHETER WITH OMENTAPEXY (Abdomen) ? ?Patient Location: PACU ? ?Anesthesia Type:General ? ?Level of Consciousness: drowsy and patient cooperative ? ?Airway & Oxygen Therapy: Patient Spontanous Breathing ? ?Post-op Assessment: Report given to RN and Post -op Vital signs reviewed and stable ? ?Post vital signs: Reviewed and stable ? ?Last Vitals:  ?Vitals Value Taken Time  ?BP 137/70 08/10/21 0849  ?Temp    ?Pulse 54 08/10/21 0851  ?Resp 16 08/10/21 0851  ?SpO2 94 % 08/10/21 0851  ?Vitals shown include unvalidated device data. ? ?Last Pain:  ?Vitals:  ? 08/10/21 0619  ?TempSrc:   ?PainSc: 0-No pain  ?   ? ?  ? ?Complications: No notable events documented. ?

## 2021-08-10 NOTE — Discharge Instructions (Signed)
Peritoneal Dialysis Catheter Placement, Care After ?The following information offers guidance on how to care for yourself after your procedure. Your health care provider may also give you more specific instructions. If you have problems or questions, contact your health care provider. ?What can I expect after the procedure? ?After the procedure, it is common to have some pain or discomfort in your abdomen and your incision area. ?You may need to wait 2 weeks after your procedure before you can start peritoneal dialysis treatment. If you need dialysis before that time, your health care provider may begin peritoneal dialysis treatment early or offer kidney dialysis treatments (hemodialysis) until you heal. ?Follow these instructions at home: ?Incision care ? ?Follow instructions from your health care provider about how to take care of your incision or incisions. Make sure you: ?Wash your hands with soap and water for at least 20 seconds before and after you change your bandage (dressing). If soap and water are not available, use hand sanitizer. ?Change your dressing only as told by your health care provider. Your health care provider may tell you not to touch or change your dressing. ?Leave stitches (sutures), staples, skin glue, or adhesive strips in place. These skin closures may need to stay in place for 2 weeks or longer. If adhesive strip edges start to loosen and curl up, you may trim the loose edges. Do not remove adhesive strips completely unless your health care provider tells you to do that. ?Check your incision areas every day for signs of infection. If you were instructed not to touch or change your dressing, look at your dressing for signs of infection. Check for: ?Redness, swelling, or more pain. ?Fluid or blood. ?Warmth. ?Pus or a bad smell. ?Medicines ?Take over-the-counter and prescription medicines only as told by your health care provider. ?If you were prescribed an antibiotic medicine, use it as  told by your health care provider. Do not stop using the antibiotic even if you start to feel better. ?Ask your health care provider if the medicine prescribed to you requires you to avoid driving or using machinery. ?Driving ?Do not drive or ride in a car until your health care provider approves. Your seat belt could move the catheter out of position or cause irritation by rubbing on your incision. ?Activity ? ?Rest and limit your activity. ?Do not lift anything that is heavier than 10 lb (4.5 kg), or the limit that you are told, until your health care provider says that it is safe. ?Return to your normal activities as told by your health care provider. Ask your health care provider what activities are safe for you. ?Managing constipation ?Your condition may cause constipation. To prevent or treat constipation, you may need to: ?Drink enough fluid to keep your urine pale yellow. ?Take over-the-counter or prescription medicines. ?Eat foods that are high in fiber, such as beans, whole grains, and fresh fruits and vegetables. ?Limit foods that are high in fat and processed sugars, such as fried or sweet foods. ?General instructions ?Do not use any products that contain nicotine or tobacco. These products include cigarettes, chewing tobacco, and vaping devices, such as e-cigarettes. If you need help quitting, ask your health care provider. ?Follow instructions from your health care provider about eating or drinking restrictions. ?Do not take baths, swim, or use a hot tub until your health care provider approves. Ask your health care provider if you may take showers. You may only be allowed to take sponge baths. ?Wear loose-fitting clothing that keeps  the catheter covered so that it cannot get caught on something. ?Keep your catheter clean and dry. ?Keep all follow-up visits. This is important. ?Contact a health care provider if: ?You have a fever or chills. ?You have warmth, redness, swelling, or more pain around an  incision. ?You have fluid or blood coming from an incision. ?You have pus or a bad smell coming from an incision. ?You cannot eat or drink without vomiting. ?Get help right away if: ?You have problems breathing. ?You are confused. ?You have trouble speaking. ?You have severe pain in your abdomen that does not get better with treatment. ?You have bright red blood in your stool (feces), or your stool is dark black and looks like tar. ?These symptoms may represent a serious problem that is an emergency. Do not wait to see if the symptoms will go away. Get medical help right away. Call your local emergency services (911 in the [place] Do not drive yourself to the hospital. ?Summary ?After the procedure, it is common to have some pain or discomfort in your abdomen, your incision area, or both. ?You may have to wait 2 weeks after your procedure before you can start peritoneal dialysis treatment. ?Check your incision area every day for signs of infection. ?Get medical help right away if you have severe pain in your abdomen that does not get better with treatment. ?This information is not intended to replace advice given to you by your health care provider. Make sure you discuss any questions you have with your health care provider. ?Document Revised: 01/03/2020 Document Reviewed: 01/03/2020 ?Elsevier Patient Education ? [place]. ?

## 2021-08-10 NOTE — Op Note (Signed)
DATE OF SERVICE: 08/10/2021 ? ?PATIENT:  Steven Perez  71 y.o. male ? ?PRE-OPERATIVE DIAGNOSIS:  ESRD ? ?POST-OPERATIVE DIAGNOSIS:  Same ? ?PROCEDURE:   ?1) laparoscopic omentopexy ?2) laparoscopic peritoneal dialysis catheter ? ?SURGEON:  Surgeon(s) and Role: ?   * [name], MD - Primary ? ?ASSISTANT: Leontine Locket, PA-C ? ?An experienced assistant was required given the complexity of this procedure and the standard of surgical care. My assistant helped with exposure through counter tension, suctioning, ligation and retraction to better visualize the surgical field.  My assistant expedited sewing during the case by following my sutures. Wherever I use the term "we" in the report, my assistant actively helped me with that portion of the procedure. ? ?ANESTHESIA:   general ? ?EBL: minimal ? ?BLOOD ADMINISTERED:none ? ?DRAINS:  PD catheter to left abdomen   ? ?LOCAL MEDICATIONS USED:  LIDOCAINE  ? ?SPECIMEN:  none ? ?COUNTS: confirmed correct. ? ?TOURNIQUET:  none ? ?PATIENT DISPOSITION:  PACU - hemodynamically stable. ?  ?Delay start of Pharmacological VTE agent (>24hrs) due to surgical blood loss or risk of bleeding: no ? ?INDICATION FOR PROCEDURE: Steven Perez is a 71 y.o. male with ESRD in need of permanent dialysis access. After careful discussion of risks, benefits, and alternatives the patient was offered laparoscopic peritoneal dialysis catheter placement.  The patient understood and wished to proceed. ? ?OPERATIVE FINDINGS: unremarkable omentopexy. Unremarkable peritoneal dialysis catheter placement. Catheter functioning well. ? ?DESCRIPTION OF PROCEDURE: After identification of the patient in the pre-operative holding area, the patient was transferred to the operating room. The patient was positioned supine on the operating room table. Anesthesia was induced. The abdomen was prepped and draped in standard fashion. A surgical pause was performed confirming correct patient, procedure, and  operative location. ? ?A Veress needle was introduced into the abdomen at Palmer's point, immediately below the left costal margin.  A saline drop test was used to confirm intra-abdominal position.  The Veress needle was connected to insufflation tubing and insufflation initiated.  A low opening pressure and good flow rate were noted.  A 5 mm trocar was introduced into the right upper quadrant using Visi-View technique.  The obturator was removed and the abdomen inspected with a 30 degree angled laparoscope.  No evidence of Veress needle injury was noted.  An additional 5 mm trocar was inserted a handsbreadth away to facilitate the case. ? ?The omentum was grasped with an atraumatic grasper and elevated to the upper abdomen.  A laparoscopic suture passer was used to deliver a 0 Prolene suture through the omentum.  The suture was secured down to the anterior abdominal wall with a knot.  The omentum was pexied in place. ? ?The abdomen was desufflated.  The peritoneal catheter was measured across the abdominal wall surface.  A mark was made by the cuff in the periumbilical abdomen.  The abdomen was reinsufflated.  An advantage 5 mm trocar was then used to create a skiving path through the anterior rectus fascia, rectus muscle, and peritoneum.  The laparoscopic trocar entered in the suprapubic abdomen directing towards the pelvis.  The working end of the catheter was delivered through the trocar.  The cuff was brought into the abdomen and then placed back into the rectus muscle.  The abdomen was desufflated again. ? ?A "swan-neck" extension tubing for the dialysis catheter was brought onto the field.  The catheter was laid across the desufflated abdomen to lay across the upper quadrant and exit the  left abdomen.  The catheter was cut.  The 2 pieces of catheter were then [facility] using a Christmas tree adapter.  This was secured in place with 2 interrupted sutures of 0 Prolene.  The connected catheter was then tunneled to  a counterincision in the upper quadrant and then out the lateral abdomen in a downward deflection.  Great care was taken to avoid twisting or kinking the catheter. ? ?The abdomen was reinsufflated.  The peritoneum was inspected to ensure the catheter did not enter the cavity.  Satisfied we desufflated the abdomen.  The catheter was connected to cystoscopy tubing.  The catheter flushed and drained without any difficulty.  The trochars were removed.  All incisions were closed with interrupted 4-0 Monocryl sutures.  Dermabond was applied.  A sterile bandage was applied to the peritoneal dialysis catheter.  ? ?Upon completion of the case instrument and sharps counts were confirmed correct. The patient was transferred to the PACU in good condition. I was present for all portions of the procedure. ? ?Yevonne Aline. Stanford Breed, MD ?[facility] ?Office Phone Number: [phone] ?08/10/2021 8:38 AM ? ? ? ?

## 2021-08-11 ENCOUNTER — Encounter (HOSPITAL_COMMUNITY): Payer: Self-pay | Admitting: Vascular Surgery

## 2021-08-13 ENCOUNTER — Encounter (HOSPITAL_COMMUNITY): Payer: Self-pay

## 2021-08-13 ENCOUNTER — Encounter (HOSPITAL_COMMUNITY)
Admission: RE | Admit: 2021-08-13 | Discharge: 2021-08-13 | Disposition: A | Discharge status: Home / Self Care | Payer: Medicare PPO | Source: Ambulatory Visit | Attending: Nephrology | Admitting: Nephrology

## 2021-08-13 DIAGNOSIS — N185 Chronic kidney disease, stage 5: Secondary | ICD-10-CM | POA: Diagnosis present

## 2021-08-13 DIAGNOSIS — D631 Anemia in chronic kidney disease: Secondary | ICD-10-CM | POA: Diagnosis not present

## 2021-08-13 LAB — POCT HEMOGLOBIN-HEMACUE: Hemoglobin: 10.2 g/dL — ABNORMAL LOW (ref 13.0–17.0)

## 2021-08-13 MED ORDER — EPOETIN ALFA-EPBX 10000 UNIT/ML IJ SOLN
10000.0000 [IU] | Freq: Once | INTRAMUSCULAR | Status: AC
  Administered 2021-08-13: 10000 [IU] via SUBCUTANEOUS

## 2021-08-13 MED ORDER — EPOETIN ALFA-EPBX 10000 UNIT/ML IJ SOLN
INTRAMUSCULAR | Status: AC
  Filled 2021-08-13: qty 1

## 2021-08-24 DIAGNOSIS — N186 End stage renal disease: Secondary | ICD-10-CM | POA: Diagnosis not present

## 2021-08-24 DIAGNOSIS — Z992 Dependence on renal dialysis: Secondary | ICD-10-CM | POA: Diagnosis not present

## 2021-08-24 DIAGNOSIS — N2581 Secondary hyperparathyroidism of renal origin: Secondary | ICD-10-CM | POA: Diagnosis not present

## 2021-08-25 DIAGNOSIS — N2581 Secondary hyperparathyroidism of renal origin: Secondary | ICD-10-CM | POA: Diagnosis not present

## 2021-08-25 DIAGNOSIS — Z992 Dependence on renal dialysis: Secondary | ICD-10-CM | POA: Diagnosis not present

## 2021-08-25 DIAGNOSIS — N186 End stage renal disease: Secondary | ICD-10-CM | POA: Diagnosis not present

## 2021-08-26 DIAGNOSIS — N186 End stage renal disease: Secondary | ICD-10-CM | POA: Diagnosis not present

## 2021-08-26 DIAGNOSIS — N2581 Secondary hyperparathyroidism of renal origin: Secondary | ICD-10-CM | POA: Diagnosis not present

## 2021-08-26 DIAGNOSIS — Z992 Dependence on renal dialysis: Secondary | ICD-10-CM | POA: Diagnosis not present

## 2021-08-27 DIAGNOSIS — N186 End stage renal disease: Secondary | ICD-10-CM | POA: Diagnosis not present

## 2021-08-27 DIAGNOSIS — Z992 Dependence on renal dialysis: Secondary | ICD-10-CM | POA: Diagnosis not present

## 2021-08-27 DIAGNOSIS — N2581 Secondary hyperparathyroidism of renal origin: Secondary | ICD-10-CM | POA: Diagnosis not present

## 2021-08-28 DIAGNOSIS — N186 End stage renal disease: Secondary | ICD-10-CM | POA: Diagnosis not present

## 2021-08-28 DIAGNOSIS — N2581 Secondary hyperparathyroidism of renal origin: Secondary | ICD-10-CM | POA: Diagnosis not present

## 2021-08-28 DIAGNOSIS — Z992 Dependence on renal dialysis: Secondary | ICD-10-CM | POA: Diagnosis not present

## 2021-08-31 DIAGNOSIS — N186 End stage renal disease: Secondary | ICD-10-CM | POA: Diagnosis not present

## 2021-08-31 DIAGNOSIS — Z992 Dependence on renal dialysis: Secondary | ICD-10-CM | POA: Diagnosis not present

## 2021-08-31 DIAGNOSIS — N2581 Secondary hyperparathyroidism of renal origin: Secondary | ICD-10-CM | POA: Diagnosis not present

## 2021-09-01 ENCOUNTER — Other Ambulatory Visit: Payer: Self-pay | Admitting: Physician Assistant

## 2021-09-02 DIAGNOSIS — Z992 Dependence on renal dialysis: Secondary | ICD-10-CM | POA: Diagnosis not present

## 2021-09-02 DIAGNOSIS — N186 End stage renal disease: Secondary | ICD-10-CM | POA: Diagnosis not present

## 2021-09-02 DIAGNOSIS — N2581 Secondary hyperparathyroidism of renal origin: Secondary | ICD-10-CM | POA: Diagnosis not present

## 2021-09-03 ENCOUNTER — Encounter (HOSPITAL_COMMUNITY): Payer: Medicare PPO

## 2021-09-03 DIAGNOSIS — N2581 Secondary hyperparathyroidism of renal origin: Secondary | ICD-10-CM | POA: Diagnosis not present

## 2021-09-03 DIAGNOSIS — N186 End stage renal disease: Secondary | ICD-10-CM | POA: Diagnosis not present

## 2021-09-03 DIAGNOSIS — Z992 Dependence on renal dialysis: Secondary | ICD-10-CM | POA: Diagnosis not present

## 2021-09-04 DIAGNOSIS — Z992 Dependence on renal dialysis: Secondary | ICD-10-CM | POA: Diagnosis not present

## 2021-09-04 DIAGNOSIS — N186 End stage renal disease: Secondary | ICD-10-CM | POA: Diagnosis not present

## 2021-09-04 DIAGNOSIS — N2581 Secondary hyperparathyroidism of renal origin: Secondary | ICD-10-CM | POA: Diagnosis not present

## 2021-09-07 DIAGNOSIS — N2581 Secondary hyperparathyroidism of renal origin: Secondary | ICD-10-CM | POA: Diagnosis not present

## 2021-09-07 DIAGNOSIS — Z992 Dependence on renal dialysis: Secondary | ICD-10-CM | POA: Diagnosis not present

## 2021-09-07 DIAGNOSIS — N186 End stage renal disease: Secondary | ICD-10-CM | POA: Diagnosis not present

## 2021-09-08 ENCOUNTER — Encounter (HOSPITAL_COMMUNITY)
Admission: RE | Admit: 2021-09-08 | Discharge: 2021-09-08 | Disposition: A | Discharge status: Home / Self Care | Payer: Medicare PPO | Source: Ambulatory Visit | Attending: Nephrology | Admitting: Nephrology

## 2021-09-08 DIAGNOSIS — N186 End stage renal disease: Secondary | ICD-10-CM | POA: Diagnosis not present

## 2021-09-08 DIAGNOSIS — N2581 Secondary hyperparathyroidism of renal origin: Secondary | ICD-10-CM | POA: Diagnosis not present

## 2021-09-08 DIAGNOSIS — Z992 Dependence on renal dialysis: Secondary | ICD-10-CM | POA: Diagnosis not present

## 2021-09-09 DIAGNOSIS — Z992 Dependence on renal dialysis: Secondary | ICD-10-CM | POA: Diagnosis not present

## 2021-09-09 DIAGNOSIS — N186 End stage renal disease: Secondary | ICD-10-CM | POA: Diagnosis not present

## 2021-09-09 DIAGNOSIS — N2581 Secondary hyperparathyroidism of renal origin: Secondary | ICD-10-CM | POA: Diagnosis not present

## 2021-09-10 DIAGNOSIS — Z992 Dependence on renal dialysis: Secondary | ICD-10-CM | POA: Diagnosis not present

## 2021-09-10 DIAGNOSIS — N2581 Secondary hyperparathyroidism of renal origin: Secondary | ICD-10-CM | POA: Diagnosis not present

## 2021-09-10 DIAGNOSIS — N186 End stage renal disease: Secondary | ICD-10-CM | POA: Diagnosis not present

## 2021-09-11 DIAGNOSIS — N2581 Secondary hyperparathyroidism of renal origin: Secondary | ICD-10-CM | POA: Diagnosis not present

## 2021-09-11 DIAGNOSIS — N186 End stage renal disease: Secondary | ICD-10-CM | POA: Diagnosis not present

## 2021-09-11 DIAGNOSIS — Z992 Dependence on renal dialysis: Secondary | ICD-10-CM | POA: Diagnosis not present

## 2021-09-14 DIAGNOSIS — N2581 Secondary hyperparathyroidism of renal origin: Secondary | ICD-10-CM | POA: Diagnosis not present

## 2021-09-14 DIAGNOSIS — N186 End stage renal disease: Secondary | ICD-10-CM | POA: Diagnosis not present

## 2021-09-14 DIAGNOSIS — Z992 Dependence on renal dialysis: Secondary | ICD-10-CM | POA: Diagnosis not present

## 2021-09-15 DIAGNOSIS — N2581 Secondary hyperparathyroidism of renal origin: Secondary | ICD-10-CM | POA: Diagnosis not present

## 2021-09-15 DIAGNOSIS — N186 End stage renal disease: Secondary | ICD-10-CM | POA: Diagnosis not present

## 2021-09-15 DIAGNOSIS — Z992 Dependence on renal dialysis: Secondary | ICD-10-CM | POA: Diagnosis not present

## 2021-09-16 DIAGNOSIS — N2581 Secondary hyperparathyroidism of renal origin: Secondary | ICD-10-CM | POA: Diagnosis not present

## 2021-09-16 DIAGNOSIS — N186 End stage renal disease: Secondary | ICD-10-CM | POA: Diagnosis not present

## 2021-09-16 DIAGNOSIS — Z992 Dependence on renal dialysis: Secondary | ICD-10-CM | POA: Diagnosis not present

## 2021-09-17 DIAGNOSIS — N186 End stage renal disease: Secondary | ICD-10-CM | POA: Diagnosis not present

## 2021-09-17 DIAGNOSIS — Z992 Dependence on renal dialysis: Secondary | ICD-10-CM | POA: Diagnosis not present

## 2021-09-17 DIAGNOSIS — N2581 Secondary hyperparathyroidism of renal origin: Secondary | ICD-10-CM | POA: Diagnosis not present

## 2021-09-18 ENCOUNTER — Ambulatory Visit: Payer: Medicare PPO | Admitting: Cardiology

## 2021-09-18 ENCOUNTER — Encounter: Payer: Self-pay | Admitting: Cardiology

## 2021-09-18 VITALS — BP 122/62 | HR 88 | Ht 68.0 in | Wt 202.0 lb

## 2021-09-18 DIAGNOSIS — N186 End stage renal disease: Secondary | ICD-10-CM | POA: Diagnosis not present

## 2021-09-18 DIAGNOSIS — N2581 Secondary hyperparathyroidism of renal origin: Secondary | ICD-10-CM | POA: Diagnosis not present

## 2021-09-18 DIAGNOSIS — I5022 Chronic systolic (congestive) heart failure: Secondary | ICD-10-CM

## 2021-09-18 DIAGNOSIS — Z992 Dependence on renal dialysis: Secondary | ICD-10-CM | POA: Diagnosis not present

## 2021-09-18 MED ORDER — SACUBITRIL-VALSARTAN 24-26 MG PO TABS: 1.0000 | ORAL_TABLET | Freq: Two times a day (BID) | ORAL | 6 refills | Status: AC

## 2021-09-18 NOTE — Progress Notes (Signed)
? ? ? ?Clinical Summary ?Mr. Steven Perez is a 71 y.o.male seen today for follow up of the following medical problems.  ?  ?  ?1.Chronic combined systolic/diastolic HF ?- echocardiogram at [facility] 04/2018, his EF was 40% but no wall motion abnormalities and a Myoview was without ischemia. Cath not pursued at the time due to advanced kidney disease ?- 08/2020 echo: LVEF 30-35%, global hypokinesis, grade III dd, normal RV, PASP 86 ?  ?  ? - breathing much improved since starting HD. Weight down from 254 to 202 lbs ?- compliant with meds ?  ?  ?  ?2. ESRD ?- on home HD x 3 weeks now.  ?- weight 254 07/2021 254, today 202 lbs ?- tolerating sessions well ? ?  ?  ?3. HTN ?- home bps 130/70s ?  ?  ?  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  ? ? ?Past Medical History:  ?Diagnosis Date  ? Acute on chronic renal insufficiency   ? Anemia   ? assoc w/ chronic reanl failure  ? Arthralgia   ? CHF (congestive heart failure) ([place])   ? a. EF 40% in 04/2018 and NST showing no ischemia b. EF at 25-30% in 11/2018 c. 30-35% in 08/2020  ? Chronic kidney disease   ? Stage 5  ? Diabetes mellitus   ? Dyslipidemia   ? 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 History of kidney stones   ? HLD (hyperlipidemia)   ? Hypertension   ? Insomnia   ? Left renal mass   ? Obesity   ? Secondary hyperparathyroidism of renal origin [facility])   ? Type 2 diabetes mellitus ([place])   ? ? ? ?Allergies  ?Allergen Reactions  ? Contrast Media [Iodinated Contrast Media] Other (See Comments)  ?  Renal failure after administration of CT contrast    ? Tetanus Toxoids Swelling  ?  "bad reaction;" told not to take this again  ? ? ? ?Current Outpatient Medications  ?Medication Sig Dispense Refill  ? acetaminophen (TYLENOL) 500 MG tablet Take 500-1,000 mg by mouth every 6 (six) hours as needed (for pain.).    ? calcitRIOL (ROCALTROL) 0.25 MCG capsule Take 0.25 mcg by mouth every evening.    ? carvedilol  (COREG) 25 MG tablet TAKE 1 TABLET(25 MG) BY MOUTH TWICE DAILY WITH A MEAL 180 tablet 3  ? doxazosin (CARDURA) 4 MG tablet Take 4 mg by mouth at bedtime.    ? ferric citrate (AURYXIA) 1 GM 210 MG(Fe) tablet Take 210 mg by mouth 3 (three) times daily with meals.    ? furosemide (LASIX) 40 MG tablet Take 80 mg by mouth 2 (two) times daily.    ? hydrALAZINE (APRESOLINE) 25 MG tablet Take 1.5 tablets (37.5 mg total) by mouth in the morning and at bedtime. 270 tablet 3  ? insulin degludec (TRESIBA) 200 UNIT/ML FlexTouch Pen Inject 20 Units into the skin at bedtime.    ? isosorbide mononitrate (IMDUR) 30 MG 24 hr tablet Take 1 tablet (30 mg total) by mouth daily. 90 tablet 3  ? oxyCODONE-acetaminophen (PERCOCET) 5-325 MG tablet Take 1 tablet by mouth every 6 (six) hours as needed for severe pain. 8 tablet 0  ? potassium chloride SA (K-DUR) 20 MEQ tablet Take 1 tablet (20 mEq total) by mouth daily. 30 tablet 2  ? pravastatin (PRAVACHOL) 20 MG tablet Take 20 mg by mouth at bedtime.    ? sodium bicarbonate 650  MG tablet Take 650 mg by mouth 3 (three) times daily.    ? traZODone (DESYREL) 100 MG tablet Take 200 mg by mouth at bedtime.    ? ?No current facility-administered medications for this visit.  ? ? ? ?Past Surgical History:  ?Procedure Laterality Date  ? AV FISTULA PLACEMENT Left 11/02/2016  ? Procedure: ARTERIOVENOUS (AV) FISTULA CREATION LEFT UPPER ARM;  Surgeon: Elam Dutch, MD;  Location: [facility];  Service: Vascular;  Laterality: Left;  ? CAPD INSERTION N/A 08/10/2021  ? Procedure: LAPAROSCOPIC INSERTION CONTINUOUS AMBULATORY PERITONEAL DIALYSIS  (CAPD) CATHETER WITH OMENTAPEXY;  Surgeon: [name], MD;  Location: [facility];  Service: Vascular;  Laterality: N/A;  ? IR GENERIC HISTORICAL  06/04/2014  ? IR RADIOLOGIST EVAL & MGMT 06/04/2014 Aletta Edouard, MD [facility] INTERV RAD  ? IR GENERIC HISTORICAL  01/15/2016  ? IR RADIOLOGIST EVAL & MGMT 01/15/2016 [facility] INTERV RAD  ? IR RADIOLOGIST EVAL & MGMT  01/25/2017  ? IR  RADIOLOGIST EVAL & MGMT  05/01/2019  ? RENAL BIOPSY    ? RENAL BIOPSY, PERCUTANEOUS    ? TONSILLECTOMY    ? ? ? ?Allergies  ?Allergen Reactions  ? Contrast Media [Iodinated Contrast Media] Other (See Comments)  ?  Renal failure after administration of CT contrast    ? Tetanus Toxoids Swelling  ?  "bad reaction;" told not to take this again  ? ? ? ? ?Family History  ?Problem Relation Age of Onset  ? Diabetes Mellitus II Mother   ? CAD Neg Hx   ? Stroke Neg Hx   ? ? ? ?Social History ?Mr. Weathers reports that he has quit smoking. He has never used smokeless tobacco. ?Mr. Milbourn reports no history of alcohol use. ? ? ?Review of Systems ?CONSTITUTIONAL: No weight loss, fever, chills, weakness or fatigue.  ?HEENT: Eyes: No visual loss, blurred vision, double vision or yellow sclerae.No hearing loss, sneezing, congestion, runny nose or sore throat.  ?SKIN: No rash or itching.  ?CARDIOVASCULAR: per hpi ?RESPIRATORY: No shortness of breath, cough or sputum.  ?GASTROINTESTINAL: No anorexia, nausea, vomiting or diarrhea. No abdominal pain or blood.  ?GENITOURINARY: No burning on urination, no polyuria ?NEUROLOGICAL: No headache, dizziness, syncope, paralysis, ataxia, numbness or tingling in the extremities. No change in bowel or bladder control.  ?MUSCULOSKELETAL: No muscle, back pain, joint pain or stiffness.  ?LYMPHATICS: No enlarged nodes. No history of splenectomy.  ?PSYCHIATRIC: No history of depression or anxiety.  ?ENDOCRINOLOGIC: No reports of sweating, cold or heat intolerance. No polyuria or polydipsia.  ?. ? ? ?Physical Examination ?Today's Vitals  ? 09/18/21 0811  ?BP: 122/62  ?Pulse: 88  ?SpO2: 100%  ?Weight: 202 lb (91.6 kg)  ?Height: '5\' 8"'$  (1.727 m)  ? ?Body mass index is 30.71 kg/m?. ? ?Gen: resting comfortably, no acute distress ?HEENT: no scleral icterus, pupils equal round and reactive, no palptable cervical adenopathy,  ?CV: RRR, no m/r/g no jvd ?Resp: Clear to auscultation bilaterally ?GI: abdomen is soft,  non-tender, non-distended, normal bowel sounds, no hepatosplenomegaly ?MSK: extremities are warm, 1+ bilateral LE edema ?Skin: warm, no rash ?Neuro:  no focal deficits ?Psych: appropriate affect ? ? ? ? ?Assessment and Plan  ?Chronic systolic HF ?- medical therapy previously limited by severe renal dysfunction, he is now on HD ?- marked fluid and weight loss since starting HD, weight 254-->202 lbs ?- now that on HD will d/c hydral/imdur, start entresto 24/'26mg'$  bid and titrate as tolerated. Add aldactone in the near future.  SGLT2i would be contraindicated in HD patient. ?- once meds optimized recheck echo. If persistent dysfunction consider cath, did not have one at time of diagnosis of HF given renal dysfunction at that time, now on HD would be possible.  ?- he will call and update [place] on bps in 1 week. Frequent labs with neprhology, will request when he calls [place] in 1 week ? ?2. PACs ?- EKG SR, PACs ?- denies symptoms, continue beta blocker ? ?F/u 3 months ? ? ? ?Arnoldo Lenis, M.D. ?

## 2021-09-18 NOTE — Patient Instructions (Addendum)
Medication Instructions:  ?Stop Hydralazine ?Stop Imdur ?Start Entresto 24-26 mg tablets twice daily ? ?Follow-Up: ?Follow up with Dr. Harl Bowie in 3 months.  ? ?Any Other Special Instructions Will Be Listed Below (If Applicable). ? ?Keep track your blood pressure for 1 week and update our office with those readings.  ? ? ?If you need a refill on your cardiac medications before your next appointment, please call your pharmacy. ? ?

## 2021-09-21 DIAGNOSIS — N186 End stage renal disease: Secondary | ICD-10-CM | POA: Diagnosis not present

## 2021-09-21 DIAGNOSIS — N2581 Secondary hyperparathyroidism of renal origin: Secondary | ICD-10-CM | POA: Diagnosis not present

## 2021-09-21 DIAGNOSIS — Z992 Dependence on renal dialysis: Secondary | ICD-10-CM | POA: Diagnosis not present

## 2021-09-22 DIAGNOSIS — N186 End stage renal disease: Secondary | ICD-10-CM | POA: Diagnosis not present

## 2021-09-22 DIAGNOSIS — Z992 Dependence on renal dialysis: Secondary | ICD-10-CM | POA: Diagnosis not present

## 2021-09-22 DIAGNOSIS — N2581 Secondary hyperparathyroidism of renal origin: Secondary | ICD-10-CM | POA: Diagnosis not present

## 2021-09-23 DIAGNOSIS — N186 End stage renal disease: Secondary | ICD-10-CM | POA: Diagnosis not present

## 2021-09-23 DIAGNOSIS — N2581 Secondary hyperparathyroidism of renal origin: Secondary | ICD-10-CM | POA: Diagnosis not present

## 2021-09-23 DIAGNOSIS — Z992 Dependence on renal dialysis: Secondary | ICD-10-CM | POA: Diagnosis not present

## 2021-09-24 DIAGNOSIS — N2581 Secondary hyperparathyroidism of renal origin: Secondary | ICD-10-CM | POA: Diagnosis not present

## 2021-09-24 DIAGNOSIS — Z992 Dependence on renal dialysis: Secondary | ICD-10-CM | POA: Diagnosis not present

## 2021-09-24 DIAGNOSIS — N186 End stage renal disease: Secondary | ICD-10-CM | POA: Diagnosis not present

## 2021-09-25 DIAGNOSIS — Z992 Dependence on renal dialysis: Secondary | ICD-10-CM | POA: Diagnosis not present

## 2021-09-25 DIAGNOSIS — N186 End stage renal disease: Secondary | ICD-10-CM | POA: Diagnosis not present

## 2021-09-25 DIAGNOSIS — N2581 Secondary hyperparathyroidism of renal origin: Secondary | ICD-10-CM | POA: Diagnosis not present

## 2021-09-27 DIAGNOSIS — Z992 Dependence on renal dialysis: Secondary | ICD-10-CM | POA: Diagnosis not present

## 2021-09-27 DIAGNOSIS — N186 End stage renal disease: Secondary | ICD-10-CM | POA: Diagnosis not present

## 2021-09-27 DIAGNOSIS — D690 Allergic purpura: Secondary | ICD-10-CM | POA: Diagnosis not present

## 2021-09-28 ENCOUNTER — Encounter: Payer: Self-pay | Admitting: Cardiology

## 2021-09-28 DIAGNOSIS — N186 End stage renal disease: Secondary | ICD-10-CM | POA: Diagnosis not present

## 2021-09-28 DIAGNOSIS — Z992 Dependence on renal dialysis: Secondary | ICD-10-CM | POA: Diagnosis not present

## 2021-09-28 DIAGNOSIS — N2581 Secondary hyperparathyroidism of renal origin: Secondary | ICD-10-CM | POA: Diagnosis not present

## 2021-09-29 DIAGNOSIS — N186 End stage renal disease: Secondary | ICD-10-CM | POA: Diagnosis not present

## 2021-09-29 DIAGNOSIS — N2581 Secondary hyperparathyroidism of renal origin: Secondary | ICD-10-CM | POA: Diagnosis not present

## 2021-09-29 DIAGNOSIS — Z992 Dependence on renal dialysis: Secondary | ICD-10-CM | POA: Diagnosis not present

## 2021-09-30 DIAGNOSIS — N2581 Secondary hyperparathyroidism of renal origin: Secondary | ICD-10-CM | POA: Diagnosis not present

## 2021-09-30 DIAGNOSIS — N186 End stage renal disease: Secondary | ICD-10-CM | POA: Diagnosis not present

## 2021-09-30 DIAGNOSIS — Z992 Dependence on renal dialysis: Secondary | ICD-10-CM | POA: Diagnosis not present

## 2021-10-01 DIAGNOSIS — N2581 Secondary hyperparathyroidism of renal origin: Secondary | ICD-10-CM | POA: Diagnosis not present

## 2021-10-01 DIAGNOSIS — Z992 Dependence on renal dialysis: Secondary | ICD-10-CM | POA: Diagnosis not present

## 2021-10-01 DIAGNOSIS — N186 End stage renal disease: Secondary | ICD-10-CM | POA: Diagnosis not present

## 2021-10-02 DIAGNOSIS — N186 End stage renal disease: Secondary | ICD-10-CM | POA: Diagnosis not present

## 2021-10-02 DIAGNOSIS — Z992 Dependence on renal dialysis: Secondary | ICD-10-CM | POA: Diagnosis not present

## 2021-10-02 DIAGNOSIS — N2581 Secondary hyperparathyroidism of renal origin: Secondary | ICD-10-CM | POA: Diagnosis not present

## 2021-10-05 DIAGNOSIS — Z992 Dependence on renal dialysis: Secondary | ICD-10-CM | POA: Diagnosis not present

## 2021-10-05 DIAGNOSIS — N2581 Secondary hyperparathyroidism of renal origin: Secondary | ICD-10-CM | POA: Diagnosis not present

## 2021-10-05 DIAGNOSIS — N186 End stage renal disease: Secondary | ICD-10-CM | POA: Diagnosis not present

## 2021-10-06 DIAGNOSIS — N186 End stage renal disease: Secondary | ICD-10-CM | POA: Diagnosis not present

## 2021-10-06 DIAGNOSIS — Z992 Dependence on renal dialysis: Secondary | ICD-10-CM | POA: Diagnosis not present

## 2021-10-06 DIAGNOSIS — N2581 Secondary hyperparathyroidism of renal origin: Secondary | ICD-10-CM | POA: Diagnosis not present

## 2021-10-07 DIAGNOSIS — I10 Essential (primary) hypertension: Secondary | ICD-10-CM | POA: Diagnosis not present

## 2021-10-07 DIAGNOSIS — Z6829 Body mass index (BMI) 29.0-29.9, adult: Secondary | ICD-10-CM | POA: Diagnosis not present

## 2021-10-07 DIAGNOSIS — E1129 Type 2 diabetes mellitus with other diabetic kidney complication: Secondary | ICD-10-CM | POA: Diagnosis not present

## 2021-10-07 DIAGNOSIS — N186 End stage renal disease: Secondary | ICD-10-CM | POA: Diagnosis not present

## 2021-10-07 DIAGNOSIS — Z992 Dependence on renal dialysis: Secondary | ICD-10-CM | POA: Diagnosis not present

## 2021-10-07 DIAGNOSIS — N2581 Secondary hyperparathyroidism of renal origin: Secondary | ICD-10-CM | POA: Diagnosis not present

## 2021-10-07 DIAGNOSIS — E663 Overweight: Secondary | ICD-10-CM | POA: Diagnosis not present

## 2021-10-07 DIAGNOSIS — N185 Chronic kidney disease, stage 5: Secondary | ICD-10-CM | POA: Diagnosis not present

## 2021-10-07 DIAGNOSIS — E211 Secondary hyperparathyroidism, not elsewhere classified: Secondary | ICD-10-CM | POA: Diagnosis not present

## 2021-10-08 DIAGNOSIS — Z992 Dependence on renal dialysis: Secondary | ICD-10-CM | POA: Diagnosis not present

## 2021-10-08 DIAGNOSIS — N186 End stage renal disease: Secondary | ICD-10-CM | POA: Diagnosis not present

## 2021-10-08 DIAGNOSIS — N2581 Secondary hyperparathyroidism of renal origin: Secondary | ICD-10-CM | POA: Diagnosis not present

## 2021-10-09 DIAGNOSIS — Z992 Dependence on renal dialysis: Secondary | ICD-10-CM | POA: Diagnosis not present

## 2021-10-09 DIAGNOSIS — N186 End stage renal disease: Secondary | ICD-10-CM | POA: Diagnosis not present

## 2021-10-09 DIAGNOSIS — N2581 Secondary hyperparathyroidism of renal origin: Secondary | ICD-10-CM | POA: Diagnosis not present

## 2021-10-12 DIAGNOSIS — Z992 Dependence on renal dialysis: Secondary | ICD-10-CM | POA: Diagnosis not present

## 2021-10-12 DIAGNOSIS — N2581 Secondary hyperparathyroidism of renal origin: Secondary | ICD-10-CM | POA: Diagnosis not present

## 2021-10-12 DIAGNOSIS — N186 End stage renal disease: Secondary | ICD-10-CM | POA: Diagnosis not present

## 2021-10-12 NOTE — Telephone Encounter (Signed)
BP's look fine, can he start aldactone 12.'5mg'$  daily. This medication works to help strengthen heart muscle ? ?Zandra Abts MD ?

## 2021-10-13 DIAGNOSIS — N186 End stage renal disease: Secondary | ICD-10-CM | POA: Diagnosis not present

## 2021-10-13 DIAGNOSIS — Z992 Dependence on renal dialysis: Secondary | ICD-10-CM | POA: Diagnosis not present

## 2021-10-13 DIAGNOSIS — N2581 Secondary hyperparathyroidism of renal origin: Secondary | ICD-10-CM | POA: Diagnosis not present

## 2021-10-13 MED ORDER — SPIRONOLACTONE 25 MG PO TABS: 12.5000 mg | ORAL_TABLET | Freq: Every day | ORAL | 3 refills | Status: DC

## 2021-10-14 DIAGNOSIS — N2581 Secondary hyperparathyroidism of renal origin: Secondary | ICD-10-CM | POA: Diagnosis not present

## 2021-10-14 DIAGNOSIS — Z992 Dependence on renal dialysis: Secondary | ICD-10-CM | POA: Diagnosis not present

## 2021-10-14 DIAGNOSIS — N186 End stage renal disease: Secondary | ICD-10-CM | POA: Diagnosis not present

## 2021-10-15 DIAGNOSIS — N186 End stage renal disease: Secondary | ICD-10-CM | POA: Diagnosis not present

## 2021-10-15 DIAGNOSIS — Z992 Dependence on renal dialysis: Secondary | ICD-10-CM | POA: Diagnosis not present

## 2021-10-15 DIAGNOSIS — N2581 Secondary hyperparathyroidism of renal origin: Secondary | ICD-10-CM | POA: Diagnosis not present

## 2021-10-16 DIAGNOSIS — N2581 Secondary hyperparathyroidism of renal origin: Secondary | ICD-10-CM | POA: Diagnosis not present

## 2021-10-16 DIAGNOSIS — N186 End stage renal disease: Secondary | ICD-10-CM | POA: Diagnosis not present

## 2021-10-16 DIAGNOSIS — Z992 Dependence on renal dialysis: Secondary | ICD-10-CM | POA: Diagnosis not present

## 2021-10-19 DIAGNOSIS — N2581 Secondary hyperparathyroidism of renal origin: Secondary | ICD-10-CM | POA: Diagnosis not present

## 2021-10-19 DIAGNOSIS — N186 End stage renal disease: Secondary | ICD-10-CM | POA: Diagnosis not present

## 2021-10-19 DIAGNOSIS — Z992 Dependence on renal dialysis: Secondary | ICD-10-CM | POA: Diagnosis not present

## 2021-10-20 DIAGNOSIS — N186 End stage renal disease: Secondary | ICD-10-CM | POA: Diagnosis not present

## 2021-10-20 DIAGNOSIS — N2581 Secondary hyperparathyroidism of renal origin: Secondary | ICD-10-CM | POA: Diagnosis not present

## 2021-10-20 DIAGNOSIS — Z992 Dependence on renal dialysis: Secondary | ICD-10-CM | POA: Diagnosis not present

## 2021-10-21 DIAGNOSIS — N186 End stage renal disease: Secondary | ICD-10-CM | POA: Diagnosis not present

## 2021-10-21 DIAGNOSIS — Z992 Dependence on renal dialysis: Secondary | ICD-10-CM | POA: Diagnosis not present

## 2021-10-21 DIAGNOSIS — N2581 Secondary hyperparathyroidism of renal origin: Secondary | ICD-10-CM | POA: Diagnosis not present

## 2021-10-22 DIAGNOSIS — N2581 Secondary hyperparathyroidism of renal origin: Secondary | ICD-10-CM | POA: Diagnosis not present

## 2021-10-22 DIAGNOSIS — N186 End stage renal disease: Secondary | ICD-10-CM | POA: Diagnosis not present

## 2021-10-22 DIAGNOSIS — Z992 Dependence on renal dialysis: Secondary | ICD-10-CM | POA: Diagnosis not present

## 2021-10-23 DIAGNOSIS — N186 End stage renal disease: Secondary | ICD-10-CM | POA: Diagnosis not present

## 2021-10-23 DIAGNOSIS — N2581 Secondary hyperparathyroidism of renal origin: Secondary | ICD-10-CM | POA: Diagnosis not present

## 2021-10-23 DIAGNOSIS — Z992 Dependence on renal dialysis: Secondary | ICD-10-CM | POA: Diagnosis not present

## 2021-10-26 DIAGNOSIS — N186 End stage renal disease: Secondary | ICD-10-CM | POA: Diagnosis not present

## 2021-10-26 DIAGNOSIS — Z992 Dependence on renal dialysis: Secondary | ICD-10-CM | POA: Diagnosis not present

## 2021-10-26 DIAGNOSIS — N2581 Secondary hyperparathyroidism of renal origin: Secondary | ICD-10-CM | POA: Diagnosis not present

## 2021-10-27 DIAGNOSIS — Z992 Dependence on renal dialysis: Secondary | ICD-10-CM | POA: Diagnosis not present

## 2021-10-27 DIAGNOSIS — N2581 Secondary hyperparathyroidism of renal origin: Secondary | ICD-10-CM | POA: Diagnosis not present

## 2021-10-27 DIAGNOSIS — N186 End stage renal disease: Secondary | ICD-10-CM | POA: Diagnosis not present

## 2021-10-28 DIAGNOSIS — N186 End stage renal disease: Secondary | ICD-10-CM | POA: Diagnosis not present

## 2021-10-28 DIAGNOSIS — Z992 Dependence on renal dialysis: Secondary | ICD-10-CM | POA: Diagnosis not present

## 2021-10-28 DIAGNOSIS — D690 Allergic purpura: Secondary | ICD-10-CM | POA: Diagnosis not present

## 2021-10-28 DIAGNOSIS — N2581 Secondary hyperparathyroidism of renal origin: Secondary | ICD-10-CM | POA: Diagnosis not present

## 2021-10-29 DIAGNOSIS — Z992 Dependence on renal dialysis: Secondary | ICD-10-CM | POA: Diagnosis not present

## 2021-10-29 DIAGNOSIS — N2581 Secondary hyperparathyroidism of renal origin: Secondary | ICD-10-CM | POA: Diagnosis not present

## 2021-10-29 DIAGNOSIS — N186 End stage renal disease: Secondary | ICD-10-CM | POA: Diagnosis not present

## 2021-10-30 DIAGNOSIS — Z992 Dependence on renal dialysis: Secondary | ICD-10-CM | POA: Diagnosis not present

## 2021-10-30 DIAGNOSIS — N186 End stage renal disease: Secondary | ICD-10-CM | POA: Diagnosis not present

## 2021-10-30 DIAGNOSIS — N2581 Secondary hyperparathyroidism of renal origin: Secondary | ICD-10-CM | POA: Diagnosis not present

## 2021-11-02 DIAGNOSIS — Z992 Dependence on renal dialysis: Secondary | ICD-10-CM | POA: Diagnosis not present

## 2021-11-02 DIAGNOSIS — N186 End stage renal disease: Secondary | ICD-10-CM | POA: Diagnosis not present

## 2021-11-02 DIAGNOSIS — N2581 Secondary hyperparathyroidism of renal origin: Secondary | ICD-10-CM | POA: Diagnosis not present

## 2021-11-03 DIAGNOSIS — N186 End stage renal disease: Secondary | ICD-10-CM | POA: Diagnosis not present

## 2021-11-03 DIAGNOSIS — N2581 Secondary hyperparathyroidism of renal origin: Secondary | ICD-10-CM | POA: Diagnosis not present

## 2021-11-03 DIAGNOSIS — Z992 Dependence on renal dialysis: Secondary | ICD-10-CM | POA: Diagnosis not present

## 2021-11-04 DIAGNOSIS — Z992 Dependence on renal dialysis: Secondary | ICD-10-CM | POA: Diagnosis not present

## 2021-11-04 DIAGNOSIS — N186 End stage renal disease: Secondary | ICD-10-CM | POA: Diagnosis not present

## 2021-11-04 DIAGNOSIS — N2581 Secondary hyperparathyroidism of renal origin: Secondary | ICD-10-CM | POA: Diagnosis not present

## 2021-11-05 DIAGNOSIS — N186 End stage renal disease: Secondary | ICD-10-CM | POA: Diagnosis not present

## 2021-11-05 DIAGNOSIS — Z992 Dependence on renal dialysis: Secondary | ICD-10-CM | POA: Diagnosis not present

## 2021-11-05 DIAGNOSIS — N2581 Secondary hyperparathyroidism of renal origin: Secondary | ICD-10-CM | POA: Diagnosis not present

## 2021-11-06 DIAGNOSIS — Z992 Dependence on renal dialysis: Secondary | ICD-10-CM | POA: Diagnosis not present

## 2021-11-06 DIAGNOSIS — N186 End stage renal disease: Secondary | ICD-10-CM | POA: Diagnosis not present

## 2021-11-06 DIAGNOSIS — N2581 Secondary hyperparathyroidism of renal origin: Secondary | ICD-10-CM | POA: Diagnosis not present

## 2021-11-09 ENCOUNTER — Encounter: Payer: Self-pay | Admitting: Cardiology

## 2021-11-09 DIAGNOSIS — Z992 Dependence on renal dialysis: Secondary | ICD-10-CM | POA: Diagnosis not present

## 2021-11-09 DIAGNOSIS — N2581 Secondary hyperparathyroidism of renal origin: Secondary | ICD-10-CM | POA: Diagnosis not present

## 2021-11-09 DIAGNOSIS — N186 End stage renal disease: Secondary | ICD-10-CM | POA: Diagnosis not present

## 2021-11-10 DIAGNOSIS — N186 End stage renal disease: Secondary | ICD-10-CM | POA: Diagnosis not present

## 2021-11-10 DIAGNOSIS — N2581 Secondary hyperparathyroidism of renal origin: Secondary | ICD-10-CM | POA: Diagnosis not present

## 2021-11-10 DIAGNOSIS — Z992 Dependence on renal dialysis: Secondary | ICD-10-CM | POA: Diagnosis not present

## 2021-11-11 DIAGNOSIS — N2581 Secondary hyperparathyroidism of renal origin: Secondary | ICD-10-CM | POA: Diagnosis not present

## 2021-11-11 DIAGNOSIS — N186 End stage renal disease: Secondary | ICD-10-CM | POA: Diagnosis not present

## 2021-11-11 DIAGNOSIS — Z992 Dependence on renal dialysis: Secondary | ICD-10-CM | POA: Diagnosis not present

## 2021-11-11 NOTE — Telephone Encounter (Signed)
BP's are too low at times. Can we clarify taking coreg '25mg'$  bid and if so lower to 12.'5mg'$  bid   J Jeri Jeanbaptiste MD

## 2021-11-12 ENCOUNTER — Telehealth: Payer: Self-pay

## 2021-11-12 DIAGNOSIS — Z992 Dependence on renal dialysis: Secondary | ICD-10-CM | POA: Diagnosis not present

## 2021-11-12 DIAGNOSIS — N2581 Secondary hyperparathyroidism of renal origin: Secondary | ICD-10-CM | POA: Diagnosis not present

## 2021-11-12 DIAGNOSIS — N186 End stage renal disease: Secondary | ICD-10-CM | POA: Diagnosis not present

## 2021-11-12 MED ORDER — CARVEDILOL 12.5 MG PO TABS: 12.5000 mg | ORAL_TABLET | Freq: Two times a day (BID) | ORAL | 3 refills | Status: DC

## 2021-11-12 NOTE — Telephone Encounter (Signed)
I spoke with patient and he agrees to decrease coreg to 12.5 mg bid

## 2021-11-13 DIAGNOSIS — N186 End stage renal disease: Secondary | ICD-10-CM | POA: Diagnosis not present

## 2021-11-13 DIAGNOSIS — N2581 Secondary hyperparathyroidism of renal origin: Secondary | ICD-10-CM | POA: Diagnosis not present

## 2021-11-13 DIAGNOSIS — Z992 Dependence on renal dialysis: Secondary | ICD-10-CM | POA: Diagnosis not present

## 2021-11-16 DIAGNOSIS — N2581 Secondary hyperparathyroidism of renal origin: Secondary | ICD-10-CM | POA: Diagnosis not present

## 2021-11-16 DIAGNOSIS — Z992 Dependence on renal dialysis: Secondary | ICD-10-CM | POA: Diagnosis not present

## 2021-11-16 DIAGNOSIS — N186 End stage renal disease: Secondary | ICD-10-CM | POA: Diagnosis not present

## 2021-11-17 DIAGNOSIS — N2581 Secondary hyperparathyroidism of renal origin: Secondary | ICD-10-CM | POA: Diagnosis not present

## 2021-11-17 DIAGNOSIS — N186 End stage renal disease: Secondary | ICD-10-CM | POA: Diagnosis not present

## 2021-11-17 DIAGNOSIS — Z992 Dependence on renal dialysis: Secondary | ICD-10-CM | POA: Diagnosis not present

## 2021-11-18 DIAGNOSIS — N186 End stage renal disease: Secondary | ICD-10-CM | POA: Diagnosis not present

## 2021-11-18 DIAGNOSIS — N2581 Secondary hyperparathyroidism of renal origin: Secondary | ICD-10-CM | POA: Diagnosis not present

## 2021-11-18 DIAGNOSIS — Z992 Dependence on renal dialysis: Secondary | ICD-10-CM | POA: Diagnosis not present

## 2021-11-19 DIAGNOSIS — N2581 Secondary hyperparathyroidism of renal origin: Secondary | ICD-10-CM | POA: Diagnosis not present

## 2021-11-19 DIAGNOSIS — N186 End stage renal disease: Secondary | ICD-10-CM | POA: Diagnosis not present

## 2021-11-19 DIAGNOSIS — Z992 Dependence on renal dialysis: Secondary | ICD-10-CM | POA: Diagnosis not present

## 2021-11-20 DIAGNOSIS — Z992 Dependence on renal dialysis: Secondary | ICD-10-CM | POA: Diagnosis not present

## 2021-11-20 DIAGNOSIS — N2581 Secondary hyperparathyroidism of renal origin: Secondary | ICD-10-CM | POA: Diagnosis not present

## 2021-11-20 DIAGNOSIS — N186 End stage renal disease: Secondary | ICD-10-CM | POA: Diagnosis not present

## 2021-11-23 DIAGNOSIS — N186 End stage renal disease: Secondary | ICD-10-CM | POA: Diagnosis not present

## 2021-11-23 DIAGNOSIS — Z992 Dependence on renal dialysis: Secondary | ICD-10-CM | POA: Diagnosis not present

## 2021-11-23 DIAGNOSIS — N2581 Secondary hyperparathyroidism of renal origin: Secondary | ICD-10-CM | POA: Diagnosis not present

## 2021-11-24 DIAGNOSIS — Z992 Dependence on renal dialysis: Secondary | ICD-10-CM | POA: Diagnosis not present

## 2021-11-24 DIAGNOSIS — N2581 Secondary hyperparathyroidism of renal origin: Secondary | ICD-10-CM | POA: Diagnosis not present

## 2021-11-24 DIAGNOSIS — N186 End stage renal disease: Secondary | ICD-10-CM | POA: Diagnosis not present

## 2021-11-25 DIAGNOSIS — Z992 Dependence on renal dialysis: Secondary | ICD-10-CM | POA: Diagnosis not present

## 2021-11-25 DIAGNOSIS — N186 End stage renal disease: Secondary | ICD-10-CM | POA: Diagnosis not present

## 2021-11-25 DIAGNOSIS — N2581 Secondary hyperparathyroidism of renal origin: Secondary | ICD-10-CM | POA: Diagnosis not present

## 2021-11-26 DIAGNOSIS — N2581 Secondary hyperparathyroidism of renal origin: Secondary | ICD-10-CM | POA: Diagnosis not present

## 2021-11-26 DIAGNOSIS — Z992 Dependence on renal dialysis: Secondary | ICD-10-CM | POA: Diagnosis not present

## 2021-11-26 DIAGNOSIS — N186 End stage renal disease: Secondary | ICD-10-CM | POA: Diagnosis not present

## 2021-11-27 DIAGNOSIS — N186 End stage renal disease: Secondary | ICD-10-CM | POA: Diagnosis not present

## 2021-11-27 DIAGNOSIS — D690 Allergic purpura: Secondary | ICD-10-CM | POA: Diagnosis not present

## 2021-11-27 DIAGNOSIS — Z992 Dependence on renal dialysis: Secondary | ICD-10-CM | POA: Diagnosis not present

## 2021-11-27 DIAGNOSIS — N2581 Secondary hyperparathyroidism of renal origin: Secondary | ICD-10-CM | POA: Diagnosis not present

## 2021-11-30 DIAGNOSIS — Z992 Dependence on renal dialysis: Secondary | ICD-10-CM | POA: Diagnosis not present

## 2021-11-30 DIAGNOSIS — N186 End stage renal disease: Secondary | ICD-10-CM | POA: Diagnosis not present

## 2021-11-30 DIAGNOSIS — N2581 Secondary hyperparathyroidism of renal origin: Secondary | ICD-10-CM | POA: Diagnosis not present

## 2021-12-01 DIAGNOSIS — Z992 Dependence on renal dialysis: Secondary | ICD-10-CM | POA: Diagnosis not present

## 2021-12-01 DIAGNOSIS — N186 End stage renal disease: Secondary | ICD-10-CM | POA: Diagnosis not present

## 2021-12-01 DIAGNOSIS — N2581 Secondary hyperparathyroidism of renal origin: Secondary | ICD-10-CM | POA: Diagnosis not present

## 2021-12-02 DIAGNOSIS — Z992 Dependence on renal dialysis: Secondary | ICD-10-CM | POA: Diagnosis not present

## 2021-12-02 DIAGNOSIS — N186 End stage renal disease: Secondary | ICD-10-CM | POA: Diagnosis not present

## 2021-12-02 DIAGNOSIS — N2581 Secondary hyperparathyroidism of renal origin: Secondary | ICD-10-CM | POA: Diagnosis not present

## 2021-12-03 DIAGNOSIS — N2581 Secondary hyperparathyroidism of renal origin: Secondary | ICD-10-CM | POA: Diagnosis not present

## 2021-12-03 DIAGNOSIS — Z992 Dependence on renal dialysis: Secondary | ICD-10-CM | POA: Diagnosis not present

## 2021-12-03 DIAGNOSIS — N186 End stage renal disease: Secondary | ICD-10-CM | POA: Diagnosis not present

## 2021-12-04 DIAGNOSIS — N186 End stage renal disease: Secondary | ICD-10-CM | POA: Diagnosis not present

## 2021-12-04 DIAGNOSIS — N2581 Secondary hyperparathyroidism of renal origin: Secondary | ICD-10-CM | POA: Diagnosis not present

## 2021-12-04 DIAGNOSIS — Z992 Dependence on renal dialysis: Secondary | ICD-10-CM | POA: Diagnosis not present

## 2021-12-07 DIAGNOSIS — N2581 Secondary hyperparathyroidism of renal origin: Secondary | ICD-10-CM | POA: Diagnosis not present

## 2021-12-07 DIAGNOSIS — N186 End stage renal disease: Secondary | ICD-10-CM | POA: Diagnosis not present

## 2021-12-07 DIAGNOSIS — Z992 Dependence on renal dialysis: Secondary | ICD-10-CM | POA: Diagnosis not present

## 2021-12-08 DIAGNOSIS — N2581 Secondary hyperparathyroidism of renal origin: Secondary | ICD-10-CM | POA: Diagnosis not present

## 2021-12-08 DIAGNOSIS — N186 End stage renal disease: Secondary | ICD-10-CM | POA: Diagnosis not present

## 2021-12-08 DIAGNOSIS — Z992 Dependence on renal dialysis: Secondary | ICD-10-CM | POA: Diagnosis not present

## 2021-12-09 DIAGNOSIS — N186 End stage renal disease: Secondary | ICD-10-CM | POA: Diagnosis not present

## 2021-12-09 DIAGNOSIS — N2581 Secondary hyperparathyroidism of renal origin: Secondary | ICD-10-CM | POA: Diagnosis not present

## 2021-12-09 DIAGNOSIS — Z992 Dependence on renal dialysis: Secondary | ICD-10-CM | POA: Diagnosis not present

## 2021-12-10 DIAGNOSIS — N2581 Secondary hyperparathyroidism of renal origin: Secondary | ICD-10-CM | POA: Diagnosis not present

## 2021-12-10 DIAGNOSIS — Z992 Dependence on renal dialysis: Secondary | ICD-10-CM | POA: Diagnosis not present

## 2021-12-10 DIAGNOSIS — N186 End stage renal disease: Secondary | ICD-10-CM | POA: Diagnosis not present

## 2021-12-11 DIAGNOSIS — N2581 Secondary hyperparathyroidism of renal origin: Secondary | ICD-10-CM | POA: Diagnosis not present

## 2021-12-11 DIAGNOSIS — Z992 Dependence on renal dialysis: Secondary | ICD-10-CM | POA: Diagnosis not present

## 2021-12-11 DIAGNOSIS — N186 End stage renal disease: Secondary | ICD-10-CM | POA: Diagnosis not present

## 2021-12-14 DIAGNOSIS — Z992 Dependence on renal dialysis: Secondary | ICD-10-CM | POA: Diagnosis not present

## 2021-12-14 DIAGNOSIS — N2581 Secondary hyperparathyroidism of renal origin: Secondary | ICD-10-CM | POA: Diagnosis not present

## 2021-12-14 DIAGNOSIS — N186 End stage renal disease: Secondary | ICD-10-CM | POA: Diagnosis not present

## 2021-12-15 DIAGNOSIS — N186 End stage renal disease: Secondary | ICD-10-CM | POA: Diagnosis not present

## 2021-12-15 DIAGNOSIS — N2581 Secondary hyperparathyroidism of renal origin: Secondary | ICD-10-CM | POA: Diagnosis not present

## 2021-12-15 DIAGNOSIS — Z992 Dependence on renal dialysis: Secondary | ICD-10-CM | POA: Diagnosis not present

## 2021-12-16 DIAGNOSIS — N2581 Secondary hyperparathyroidism of renal origin: Secondary | ICD-10-CM | POA: Diagnosis not present

## 2021-12-16 DIAGNOSIS — Z992 Dependence on renal dialysis: Secondary | ICD-10-CM | POA: Diagnosis not present

## 2021-12-16 DIAGNOSIS — N186 End stage renal disease: Secondary | ICD-10-CM | POA: Diagnosis not present

## 2021-12-17 DIAGNOSIS — Z992 Dependence on renal dialysis: Secondary | ICD-10-CM | POA: Diagnosis not present

## 2021-12-17 DIAGNOSIS — N186 End stage renal disease: Secondary | ICD-10-CM | POA: Diagnosis not present

## 2021-12-17 DIAGNOSIS — N2581 Secondary hyperparathyroidism of renal origin: Secondary | ICD-10-CM | POA: Diagnosis not present

## 2021-12-18 DIAGNOSIS — N186 End stage renal disease: Secondary | ICD-10-CM | POA: Diagnosis not present

## 2021-12-18 DIAGNOSIS — N2581 Secondary hyperparathyroidism of renal origin: Secondary | ICD-10-CM | POA: Diagnosis not present

## 2021-12-18 DIAGNOSIS — Z992 Dependence on renal dialysis: Secondary | ICD-10-CM | POA: Diagnosis not present

## 2021-12-21 DIAGNOSIS — N186 End stage renal disease: Secondary | ICD-10-CM | POA: Diagnosis not present

## 2021-12-21 DIAGNOSIS — Z992 Dependence on renal dialysis: Secondary | ICD-10-CM | POA: Diagnosis not present

## 2021-12-21 DIAGNOSIS — N2581 Secondary hyperparathyroidism of renal origin: Secondary | ICD-10-CM | POA: Diagnosis not present

## 2021-12-22 ENCOUNTER — Ambulatory Visit: Payer: Medicare PPO | Admitting: Student

## 2021-12-22 ENCOUNTER — Encounter: Payer: Self-pay | Admitting: Student

## 2021-12-22 VITALS — BP 102/58 | HR 74 | Ht 68.0 in | Wt 194.4 lb

## 2021-12-22 DIAGNOSIS — I5042 Chronic combined systolic (congestive) and diastolic (congestive) heart failure: Secondary | ICD-10-CM

## 2021-12-22 DIAGNOSIS — N186 End stage renal disease: Secondary | ICD-10-CM

## 2021-12-22 DIAGNOSIS — E785 Hyperlipidemia, unspecified: Secondary | ICD-10-CM | POA: Diagnosis not present

## 2021-12-22 DIAGNOSIS — I10 Essential (primary) hypertension: Secondary | ICD-10-CM

## 2021-12-22 DIAGNOSIS — N2581 Secondary hyperparathyroidism of renal origin: Secondary | ICD-10-CM | POA: Diagnosis not present

## 2021-12-22 DIAGNOSIS — Z992 Dependence on renal dialysis: Secondary | ICD-10-CM | POA: Diagnosis not present

## 2021-12-22 MED ORDER — CARVEDILOL 3.125 MG PO TABS: 3.1250 mg | ORAL_TABLET | Freq: Two times a day (BID) | ORAL | 3 refills | Status: AC

## 2021-12-22 NOTE — Patient Instructions (Signed)
Medication Instructions:   Decrease Coreg to 3.125 Two Times Daily   If you need a refill on your cardiac medications before your next appointment, please call your pharmacy*   Lab Work: NONE   If you have labs (blood work) drawn today and your tests are completely normal, you will receive your results only by: [place] (if you have MyChart) OR A paper copy in the mail If you have any lab test that is abnormal or we need to change your treatment, we will call you to review the results.   Testing/Procedures: NONE    Follow-Up: At [facility], you and your health needs are our priority.  As part of our continuing mission to provide you with exceptional heart care, we have created designated Provider Care Teams.  These Care Teams include your primary Cardiologist (physician) and Advanced Practice Providers (APPs -  Physician Assistants and Nurse Practitioners) who all work together to provide you with the care you need, when you need it.  We recommend signing up for the patient portal called "MyChart".  Sign up information is provided on this After Visit Summary.  MyChart is used to connect with patients for Virtual Visits (Telemedicine).  Patients are able to view lab/test results, encounter notes, upcoming appointments, etc.  Non-urgent messages can be sent to your provider as well.   To learn more about what you can do with MyChart, go to [email or web address].    Your next appointment:   3 month(s)  The format for your next appointment:   In Person  Provider:   Carlyle Dolly, MD    Other Instructions Thank you for choosing [facility]!   Your physician has requested that you regularly monitor and record your blood pressure readings at home. Please use the same machine at the same time of day to check your readings and record them. Drop off in 2-3 weeks.    Important Information About Sugar

## 2021-12-22 NOTE — Progress Notes (Addendum)
Cardiology Office Note    Date:  12/22/2021   ID:  SHRIHAAN PORZIO, DOB September 21, 1950, MRN [id number]  PCP:  Redmond School, MD  Cardiologist: Carlyle Dolly, MD    Chief Complaint  Patient presents with   Follow-up    32-monthvisit    History of Present Illness:    WSTEFEN JUBAis a 71y.o. male with past medical history of HFrEF (EF 40% in 04/2018 and NST showing no ischemia, EF at 25-30% in 11/2018 and 30-35% in 08/2020), HTN, HLD, Type 2 DM, secondary hyperparathyroidism and ESRD who presents to the office today for 380-monthollow-up.  He was last examined by Dr. BrHarl Bowien 08/2021 and had recently been started on dialysis and reported much improvement in his dyspnea and his weight had declined from 254 to 202 lbs. Given that he was on dialysis, Hydralazine/Imdur were discontinued and he was started on Entresto 24-'26mg'$  BID with plans to start Aldactone in the future. He was not started on an SGLT2 inhibitor given he was on HD. Was recommended to recheck an echocardiogram once medical therapy had been optimized and if persistent dysfunction, could consider a cardiac catheterization.  In talking with the patient today, he reports overall feeling much better since being started on peritoneal dialysis earlier this year. Says he has lost over 40 pounds since initiation of this and has noticed an improvement in his energy level. He does experience intermittent dizziness and SBP has been in the 70's to 90's at home. Reports his Nephrologist did stop Spironolactone and Cardura  He denies any recent chest pain or palpitations. No specific orthopnea, PND or pitting edema.   Past Medical History:  Diagnosis Date   Acute on chronic renal insufficiency    Anemia    assoc w/ chronic reanl failure   Arthralgia    CHF (congestive heart failure) (HCC)    a. EF 40% in 04/2018 and NST showing no ischemia b. EF at 25-30% in 11/2018 c. 30-35% in 08/2020   Chronic kidney disease    Stage 5    Diabetes mellitus    Dyslipidemia    Erectile dysfunction due to arterial insufficiency    History of kidney stones    HLD (hyperlipidemia)    Hypertension    Insomnia    Left renal mass    Obesity    [facility]   [facility]   Type 2 diabetes mellitus ([facility]    Past Surgical History:  Procedure Laterality Date   AV FISTULA PLACEMENT Left 11/02/2016   Procedure: ARTERIOVENOUS (AV) FISTULA CREATION LEFT UPPER ARM;  Surgeon: FiElam DutchMD;  [facility];  Laterality: Left;   CAPD INSERTION N/A 08/10/2021   Procedure: LAPAROSCOPIC INSERTION CONTINUOUS AMBULATORY PERITONEAL DIALYSIS  (CAPD) CATHETER WITH OMENTAPEXY;  Surgeon: HaCherre RobinsMD;  [facility];  Laterality: N/A;   IR GENERIC HISTORICAL  06/04/2014   IR RADIOLOGIST EVAL & MGMT 06/04/2014 GlAletta EdouardMD [facility] INTERV RAD   IR GENERIC HISTORICAL  01/15/2016   IR RADIOLOGIST EVAL & MGMT 01/15/2016 [facility] INTERV RAD   IR RADIOLOGIST EVAL & MGMT  01/25/2017   IR RADIOLOGIST EVAL & MGMT  05/01/2019   RENAL BIOPSY     RENAL BIOPSY, PERCUTANEOUS     TONSILLECTOMY      Current Medications: Outpatient Medications Prior to Visit  Medication Sig Dispense Refill  acetaminophen (TYLENOL) 500 MG tablet Take 500-1,000 mg by mouth every 6 (six) hours as needed (for pain.).     calcitRIOL (ROCALTROL) 0.25 MCG capsule Take 0.25 mcg by mouth every evening.     insulin degludec (TRESIBA) 200 UNIT/ML FlexTouch Pen Inject 20 Units into the skin at bedtime.     potassium chloride SA (K-DUR) 20 MEQ tablet Take 1 tablet (20 mEq total) by mouth daily. 30 tablet 2   pravastatin (PRAVACHOL) 20 MG tablet Take 20 mg by mouth at bedtime.     sacubitril-valsartan (ENTRESTO) 24-26 MG Take 1 tablet by mouth 2 (two) times daily. 60 tablet 6   traZODone (DESYREL) 100 MG tablet Take 200 mg by mouth at bedtime.     carvedilol  (COREG) 12.5 MG tablet Take 1 tablet (12.5 mg total) by mouth 2 (two) times daily. 180 tablet 3   ferric citrate (AURYXIA) 1 GM 210 MG(Fe) tablet Take 210 mg by mouth 3 (three) times daily with meals.     oxyCODONE-acetaminophen (PERCOCET) 5-325 MG tablet Take 1 tablet by mouth every 6 (six) hours as needed for severe pain. 8 tablet 0   sodium bicarbonate 650 MG tablet Take 650 mg by mouth 3 (three) times daily.     doxazosin (CARDURA) 4 MG tablet Take 4 mg by mouth at bedtime.     furosemide (LASIX) 40 MG tablet Take 80 mg by mouth 2 (two) times daily.     spironolactone (ALDACTONE) 25 MG tablet Take 0.5 tablets (12.5 mg total) by mouth daily. 45 tablet 3   No facility-administered medications prior to visit.     Allergies:   Contrast media [iodinated contrast media] and Tetanus toxoids   Social History   Socioeconomic History   Marital status: Married    Spouse name: Not on file   Number of children: Not on file   Years of education: Not on file   Highest education level: Not on file  Occupational History   Not on file  Tobacco Use   Smoking status: Former   Smokeless tobacco: Never  Vaping Use   Vaping Use: Never used  Substance and Sexual Activity   Alcohol use: No    Comment: occasionally   Drug use: No   Sexual activity: Yes  Other Topics Concern   Not on file  Social History Narrative   Not on file   Social Determinants of Health   Financial Resource Strain: Not on file  Food Insecurity: Not on file  Transportation Needs: Not on file  Physical Activity: Not on file  Stress: Not on file  Social Connections: Not on file     Family History:  The patient's family history includes Diabetes Mellitus II in his mother.   Review of Systems:    Please see the history of present illness.     All other systems reviewed and are otherwise negative except as noted above.   Physical Exam:    VS:  BP (!) 102/58   Pulse 74   Ht '5\' 8"'$  (1.727 m)   Wt 194 lb 6.4 oz  (88.2 kg)   SpO2 97%   BMI 29.56 kg/m    General: Well developed, well nourished,male appearing in no acute distress. Head: Normocephalic, atraumatic. Neck: No carotid bruits. JVD not elevated.  Lungs: Respirations regular and unlabored, without wheezes or rales.  Heart: Regular rate and rhythm. No S3 or S4.  No murmur, no rubs, or gallops appreciated. Abdomen: Appears non-distended. No obvious abdominal masses.  Msk:  Strength and tone appear normal for age. No obvious joint deformities or effusions. Extremities: No clubbing or cyanosis. No pitting edema.  Distal pedal pulses are 2+ bilaterally. Neuro: Alert and oriented X 3. Moves all extremities spontaneously. No focal deficits noted. Psych:  Responds to questions appropriately with a normal affect. Skin: No rashes or lesions noted  Wt Readings from Last 3 Encounters:  12/22/21 194 lb 6.4 oz (88.2 kg)  09/18/21 202 lb (91.6 kg)  08/13/21 254 lb (115.2 kg)     Studies/Labs Reviewed:   EKG:  EKG is not ordered today.    Recent Labs: 08/10/2021: BUN 63; Creatinine, Ser 10.50; Potassium 3.9; Sodium 143 08/13/2021: Hemoglobin 10.2   Lipid Panel No results found for: "CHOL", "TRIG", "HDL", "CHOLHDL", "VLDL", "LDLCALC", "LDLDIRECT"  Additional studies/ records that were reviewed today include:   Echocardiogram: 08/2020 IMPRESSIONS     1. Left ventricular ejection fraction, by estimation, is 30 to 35%. The  left ventricle has moderately decreased function. The left ventricle  demonstrates global hypokinesis. The left ventricular internal cavity size  was mildly dilated. There is mild  left ventricular hypertrophy. Left ventricular diastolic parameters are  consistent with Grade III diastolic dysfunction (restrictive). Elevated  left atrial pressure.   2. Right ventricular systolic function is normal. The right ventricular  size is normal. There is severely elevated pulmonary artery systolic  pressure.   3. Left atrial size  was severely dilated.   4. Right atrial size was severely dilated.   5. The mitral valve is normal in structure. Moderate mitral valve  regurgitation. No evidence of mitral stenosis.   6. Tricuspid valve regurgitation is moderate.   7. The aortic valve is tricuspid. There is mild calcification of the  aortic valve. There is mild thickening of the aortic valve. Aortic valve  regurgitation is not visualized. No aortic stenosis is present.   8. Severe pulmonary HTN, PASP is 86 mmHg.   9. The inferior vena cava is dilated in size with <50% respiratory  variability, suggesting right atrial pressure of 15 mmHg.    Assessment:    1. Chronic combined systolic and diastolic CHF (congestive heart failure) ([place])   2. Essential hypertension   3. Hyperlipidemia, unspecified hyperlipidemia type   4. ESRD (end stage renal disease) ([place])      Plan:   In order of problems listed above:  1. HFrEF - His EF was previously 40% in 04/2018 and NST showed no ischemia, EF at 25-30% in 11/2018 and 30-35% in 08/2020. A cardiac catheterization was not previously pursued given his Stage V CKD and he was recently started on dialysis.  - Currently on Coreg 12.5 mg twice daily and Entresto 24-26 mg twice daily as Spironolactone and Cardura were discontinued by Nephrology given hypotension. Given his hypotension with SBP in the 70's to 90's at home, I did recommend reducing Coreg to 3.125 mg twice daily. Dr. Harl Bowie had been attempting to titrate medical therapy for his cardiomyopathy but it appears this is going to be limited by hypotension. I will reach out to him to see if he wishes for [place] to stop Entresto if BP remains soft or possibly add Midodrine to assist with BP and allow for titration of medical therapy. Not a candidate for SGLT2 inhibitor therapy given his ESRD. Would plan for a follow-up echocardiogram later this year as previously discussed.  Addendum: 12/30/2021: Reviewed with Dr. Harl Bowie and he recommended  stopping Entresto if BP remains soft.   2.  HTN - His BP is at 102/58 during today's visit but has been much softer when checked at home as outlined above. Will reduce Coreg from 12.5 mg twice daily to 3.125 mg twice daily and continue Entresto at current dosing for now. I did ask him to keep a BP log and report back with this in 2 to 3 weeks.  3. HLD - Followed by his PCP. He remains on Pravastatin 20 mg daily. We will request most recent labs.   4. ESRD - Followed by Dr. Posey Pronto with [facility]. Recently started on peritoneal dialysis.    Medication Adjustments/Labs and Tests Ordered: Current medicines are reviewed at length with the patient today.  Concerns regarding medicines are outlined above.  Medication changes, Labs and Tests ordered today are listed in the Patient Instructions below. Patient Instructions  Medication Instructions:   Decrease Coreg to 3.125 Two Times Daily   If you need a refill on your cardiac medications before your next appointment, please call your pharmacy*   Lab Work: NONE   If you have labs (blood work) drawn today and your tests are completely normal, you will receive your results only by: [place] (if you have MyChart) OR A paper copy in the mail If you have any lab test that is abnormal or we need to change your treatment, we will call you to review the results.   Testing/Procedures: NONE    Follow-Up: At [facility], you and your health needs are our priority.  As part of our continuing mission to provide you with exceptional heart care, we have created designated Provider Care Teams.  These Care Teams include your primary Cardiologist (physician) and Advanced Practice Providers (APPs -  Physician Assistants and Nurse Practitioners) who all work together to provide you with the care you need, when you need it.  We recommend signing up for the patient portal called "MyChart".  Sign up information is provided on this After Visit  Summary.  MyChart is used to connect with patients for Virtual Visits (Telemedicine).  Patients are able to view lab/test results, encounter notes, upcoming appointments, etc.  Non-urgent messages can be sent to your provider as well.   To learn more about what you can do with MyChart, go to [email or web address].    Your next appointment:   3 month(s)  The format for your next appointment:   In Person  Provider:   Carlyle Dolly, MD    Other Instructions Thank you for choosing [facility]!   Your physician has requested that you regularly monitor and record your blood pressure readings at home. Please use the same machine at the same time of day to check your readings and record them. Drop off in 2-3 weeks.    Important Information About Sugar         Signed, Erma Heritage, PA-C  12/22/2021 4:36 PM    [place] [address] [place], [place] [address] Phone: [phone] Fax: [phone]

## 2021-12-23 DIAGNOSIS — N186 End stage renal disease: Secondary | ICD-10-CM | POA: Diagnosis not present

## 2021-12-23 DIAGNOSIS — Z992 Dependence on renal dialysis: Secondary | ICD-10-CM | POA: Diagnosis not present

## 2021-12-23 DIAGNOSIS — N2581 Secondary hyperparathyroidism of renal origin: Secondary | ICD-10-CM | POA: Diagnosis not present

## 2021-12-24 DIAGNOSIS — Z992 Dependence on renal dialysis: Secondary | ICD-10-CM | POA: Diagnosis not present

## 2021-12-24 DIAGNOSIS — N2581 Secondary hyperparathyroidism of renal origin: Secondary | ICD-10-CM | POA: Diagnosis not present

## 2021-12-24 DIAGNOSIS — N186 End stage renal disease: Secondary | ICD-10-CM | POA: Diagnosis not present

## 2021-12-25 DIAGNOSIS — N2581 Secondary hyperparathyroidism of renal origin: Secondary | ICD-10-CM | POA: Diagnosis not present

## 2021-12-25 DIAGNOSIS — N186 End stage renal disease: Secondary | ICD-10-CM | POA: Diagnosis not present

## 2021-12-25 DIAGNOSIS — Z992 Dependence on renal dialysis: Secondary | ICD-10-CM | POA: Diagnosis not present

## 2021-12-26 DIAGNOSIS — N186 End stage renal disease: Secondary | ICD-10-CM | POA: Diagnosis not present

## 2021-12-26 DIAGNOSIS — Z992 Dependence on renal dialysis: Secondary | ICD-10-CM | POA: Diagnosis not present

## 2021-12-26 DIAGNOSIS — N2581 Secondary hyperparathyroidism of renal origin: Secondary | ICD-10-CM | POA: Diagnosis not present

## 2021-12-27 DIAGNOSIS — N186 End stage renal disease: Secondary | ICD-10-CM | POA: Diagnosis not present

## 2021-12-27 DIAGNOSIS — Z992 Dependence on renal dialysis: Secondary | ICD-10-CM | POA: Diagnosis not present

## 2021-12-27 DIAGNOSIS — N2581 Secondary hyperparathyroidism of renal origin: Secondary | ICD-10-CM | POA: Diagnosis not present

## 2021-12-28 DIAGNOSIS — N2581 Secondary hyperparathyroidism of renal origin: Secondary | ICD-10-CM | POA: Diagnosis not present

## 2021-12-28 DIAGNOSIS — Z992 Dependence on renal dialysis: Secondary | ICD-10-CM | POA: Diagnosis not present

## 2021-12-28 DIAGNOSIS — N186 End stage renal disease: Secondary | ICD-10-CM | POA: Diagnosis not present

## 2021-12-28 DIAGNOSIS — D690 Allergic purpura: Secondary | ICD-10-CM | POA: Diagnosis not present

## 2021-12-29 DIAGNOSIS — N186 End stage renal disease: Secondary | ICD-10-CM | POA: Diagnosis not present

## 2021-12-29 DIAGNOSIS — N2581 Secondary hyperparathyroidism of renal origin: Secondary | ICD-10-CM | POA: Diagnosis not present

## 2021-12-29 DIAGNOSIS — Z992 Dependence on renal dialysis: Secondary | ICD-10-CM | POA: Diagnosis not present

## 2021-12-30 DIAGNOSIS — N186 End stage renal disease: Secondary | ICD-10-CM | POA: Diagnosis not present

## 2021-12-30 DIAGNOSIS — N2581 Secondary hyperparathyroidism of renal origin: Secondary | ICD-10-CM | POA: Diagnosis not present

## 2021-12-30 DIAGNOSIS — Z992 Dependence on renal dialysis: Secondary | ICD-10-CM | POA: Diagnosis not present

## 2021-12-31 DIAGNOSIS — N2581 Secondary hyperparathyroidism of renal origin: Secondary | ICD-10-CM | POA: Diagnosis not present

## 2021-12-31 DIAGNOSIS — Z992 Dependence on renal dialysis: Secondary | ICD-10-CM | POA: Diagnosis not present

## 2021-12-31 DIAGNOSIS — N186 End stage renal disease: Secondary | ICD-10-CM | POA: Diagnosis not present

## 2022-01-01 DIAGNOSIS — N186 End stage renal disease: Secondary | ICD-10-CM | POA: Diagnosis not present

## 2022-01-01 DIAGNOSIS — Z992 Dependence on renal dialysis: Secondary | ICD-10-CM | POA: Diagnosis not present

## 2022-01-01 DIAGNOSIS — N2581 Secondary hyperparathyroidism of renal origin: Secondary | ICD-10-CM | POA: Diagnosis not present

## 2022-01-02 DIAGNOSIS — Z992 Dependence on renal dialysis: Secondary | ICD-10-CM | POA: Diagnosis not present

## 2022-01-02 DIAGNOSIS — N186 End stage renal disease: Secondary | ICD-10-CM | POA: Diagnosis not present

## 2022-01-02 DIAGNOSIS — N2581 Secondary hyperparathyroidism of renal origin: Secondary | ICD-10-CM | POA: Diagnosis not present

## 2022-01-03 DIAGNOSIS — Z992 Dependence on renal dialysis: Secondary | ICD-10-CM | POA: Diagnosis not present

## 2022-01-03 DIAGNOSIS — N186 End stage renal disease: Secondary | ICD-10-CM | POA: Diagnosis not present

## 2022-01-03 DIAGNOSIS — N2581 Secondary hyperparathyroidism of renal origin: Secondary | ICD-10-CM | POA: Diagnosis not present

## 2022-01-04 ENCOUNTER — Other Ambulatory Visit: Payer: Self-pay | Admitting: Nephrology

## 2022-01-04 DIAGNOSIS — T85611A Breakdown (mechanical) of intraperitoneal dialysis catheter, initial encounter: Secondary | ICD-10-CM

## 2022-01-04 DIAGNOSIS — N186 End stage renal disease: Secondary | ICD-10-CM | POA: Diagnosis not present

## 2022-01-04 DIAGNOSIS — N2581 Secondary hyperparathyroidism of renal origin: Secondary | ICD-10-CM | POA: Diagnosis not present

## 2022-01-04 DIAGNOSIS — Z992 Dependence on renal dialysis: Secondary | ICD-10-CM | POA: Diagnosis not present

## 2022-01-05 DIAGNOSIS — N186 End stage renal disease: Secondary | ICD-10-CM | POA: Diagnosis not present

## 2022-01-05 DIAGNOSIS — Z992 Dependence on renal dialysis: Secondary | ICD-10-CM | POA: Diagnosis not present

## 2022-01-05 DIAGNOSIS — N2581 Secondary hyperparathyroidism of renal origin: Secondary | ICD-10-CM | POA: Diagnosis not present

## 2022-01-06 DIAGNOSIS — Z992 Dependence on renal dialysis: Secondary | ICD-10-CM | POA: Diagnosis not present

## 2022-01-06 DIAGNOSIS — N186 End stage renal disease: Secondary | ICD-10-CM | POA: Diagnosis not present

## 2022-01-06 DIAGNOSIS — N2581 Secondary hyperparathyroidism of renal origin: Secondary | ICD-10-CM | POA: Diagnosis not present

## 2022-01-07 DIAGNOSIS — Z992 Dependence on renal dialysis: Secondary | ICD-10-CM | POA: Diagnosis not present

## 2022-01-07 DIAGNOSIS — N186 End stage renal disease: Secondary | ICD-10-CM | POA: Diagnosis not present

## 2022-01-07 DIAGNOSIS — N2581 Secondary hyperparathyroidism of renal origin: Secondary | ICD-10-CM | POA: Diagnosis not present

## 2022-01-08 DIAGNOSIS — N2581 Secondary hyperparathyroidism of renal origin: Secondary | ICD-10-CM | POA: Diagnosis not present

## 2022-01-08 DIAGNOSIS — N186 End stage renal disease: Secondary | ICD-10-CM | POA: Diagnosis not present

## 2022-01-08 DIAGNOSIS — Z992 Dependence on renal dialysis: Secondary | ICD-10-CM | POA: Diagnosis not present

## 2022-01-09 DIAGNOSIS — N186 End stage renal disease: Secondary | ICD-10-CM | POA: Diagnosis not present

## 2022-01-09 DIAGNOSIS — N2581 Secondary hyperparathyroidism of renal origin: Secondary | ICD-10-CM | POA: Diagnosis not present

## 2022-01-09 DIAGNOSIS — Z992 Dependence on renal dialysis: Secondary | ICD-10-CM | POA: Diagnosis not present

## 2022-01-10 DIAGNOSIS — Z992 Dependence on renal dialysis: Secondary | ICD-10-CM | POA: Diagnosis not present

## 2022-01-10 DIAGNOSIS — N2581 Secondary hyperparathyroidism of renal origin: Secondary | ICD-10-CM | POA: Diagnosis not present

## 2022-01-10 DIAGNOSIS — N186 End stage renal disease: Secondary | ICD-10-CM | POA: Diagnosis not present

## 2022-01-11 DIAGNOSIS — N2581 Secondary hyperparathyroidism of renal origin: Secondary | ICD-10-CM | POA: Diagnosis not present

## 2022-01-11 DIAGNOSIS — Z992 Dependence on renal dialysis: Secondary | ICD-10-CM | POA: Diagnosis not present

## 2022-01-11 DIAGNOSIS — N186 End stage renal disease: Secondary | ICD-10-CM | POA: Diagnosis not present

## 2022-01-12 DIAGNOSIS — N2581 Secondary hyperparathyroidism of renal origin: Secondary | ICD-10-CM | POA: Diagnosis not present

## 2022-01-12 DIAGNOSIS — Z992 Dependence on renal dialysis: Secondary | ICD-10-CM | POA: Diagnosis not present

## 2022-01-12 DIAGNOSIS — N186 End stage renal disease: Secondary | ICD-10-CM | POA: Diagnosis not present

## 2022-01-13 DIAGNOSIS — N2581 Secondary hyperparathyroidism of renal origin: Secondary | ICD-10-CM | POA: Diagnosis not present

## 2022-01-13 DIAGNOSIS — Z992 Dependence on renal dialysis: Secondary | ICD-10-CM | POA: Diagnosis not present

## 2022-01-13 DIAGNOSIS — N186 End stage renal disease: Secondary | ICD-10-CM | POA: Diagnosis not present

## 2022-01-14 DIAGNOSIS — N186 End stage renal disease: Secondary | ICD-10-CM | POA: Diagnosis not present

## 2022-01-14 DIAGNOSIS — N2581 Secondary hyperparathyroidism of renal origin: Secondary | ICD-10-CM | POA: Diagnosis not present

## 2022-01-14 DIAGNOSIS — Z992 Dependence on renal dialysis: Secondary | ICD-10-CM | POA: Diagnosis not present

## 2022-01-15 DIAGNOSIS — N186 End stage renal disease: Secondary | ICD-10-CM | POA: Diagnosis not present

## 2022-01-15 DIAGNOSIS — N2581 Secondary hyperparathyroidism of renal origin: Secondary | ICD-10-CM | POA: Diagnosis not present

## 2022-01-15 DIAGNOSIS — Z992 Dependence on renal dialysis: Secondary | ICD-10-CM | POA: Diagnosis not present

## 2022-01-16 DIAGNOSIS — N2581 Secondary hyperparathyroidism of renal origin: Secondary | ICD-10-CM | POA: Diagnosis not present

## 2022-01-16 DIAGNOSIS — N186 End stage renal disease: Secondary | ICD-10-CM | POA: Diagnosis not present

## 2022-01-16 DIAGNOSIS — Z992 Dependence on renal dialysis: Secondary | ICD-10-CM | POA: Diagnosis not present

## 2022-01-17 DIAGNOSIS — N186 End stage renal disease: Secondary | ICD-10-CM | POA: Diagnosis not present

## 2022-01-17 DIAGNOSIS — Z992 Dependence on renal dialysis: Secondary | ICD-10-CM | POA: Diagnosis not present

## 2022-01-17 DIAGNOSIS — N2581 Secondary hyperparathyroidism of renal origin: Secondary | ICD-10-CM | POA: Diagnosis not present

## 2022-01-18 DIAGNOSIS — Z992 Dependence on renal dialysis: Secondary | ICD-10-CM | POA: Diagnosis not present

## 2022-01-18 DIAGNOSIS — N186 End stage renal disease: Secondary | ICD-10-CM | POA: Diagnosis not present

## 2022-01-18 DIAGNOSIS — N2581 Secondary hyperparathyroidism of renal origin: Secondary | ICD-10-CM | POA: Diagnosis not present

## 2022-01-19 DIAGNOSIS — N186 End stage renal disease: Secondary | ICD-10-CM | POA: Diagnosis not present

## 2022-01-19 DIAGNOSIS — N2581 Secondary hyperparathyroidism of renal origin: Secondary | ICD-10-CM | POA: Diagnosis not present

## 2022-01-19 DIAGNOSIS — Z992 Dependence on renal dialysis: Secondary | ICD-10-CM | POA: Diagnosis not present

## 2022-01-20 DIAGNOSIS — Z992 Dependence on renal dialysis: Secondary | ICD-10-CM | POA: Diagnosis not present

## 2022-01-20 DIAGNOSIS — N2581 Secondary hyperparathyroidism of renal origin: Secondary | ICD-10-CM | POA: Diagnosis not present

## 2022-01-20 DIAGNOSIS — N186 End stage renal disease: Secondary | ICD-10-CM | POA: Diagnosis not present

## 2022-01-21 DIAGNOSIS — N2581 Secondary hyperparathyroidism of renal origin: Secondary | ICD-10-CM | POA: Diagnosis not present

## 2022-01-21 DIAGNOSIS — N186 End stage renal disease: Secondary | ICD-10-CM | POA: Diagnosis not present

## 2022-01-21 DIAGNOSIS — Z992 Dependence on renal dialysis: Secondary | ICD-10-CM | POA: Diagnosis not present

## 2022-01-22 DIAGNOSIS — N186 End stage renal disease: Secondary | ICD-10-CM | POA: Diagnosis not present

## 2022-01-22 DIAGNOSIS — Z992 Dependence on renal dialysis: Secondary | ICD-10-CM | POA: Diagnosis not present

## 2022-01-22 DIAGNOSIS — N2581 Secondary hyperparathyroidism of renal origin: Secondary | ICD-10-CM | POA: Diagnosis not present

## 2022-01-23 DIAGNOSIS — N186 End stage renal disease: Secondary | ICD-10-CM | POA: Diagnosis not present

## 2022-01-23 DIAGNOSIS — N2581 Secondary hyperparathyroidism of renal origin: Secondary | ICD-10-CM | POA: Diagnosis not present

## 2022-01-23 DIAGNOSIS — Z992 Dependence on renal dialysis: Secondary | ICD-10-CM | POA: Diagnosis not present

## 2022-01-24 DIAGNOSIS — Z992 Dependence on renal dialysis: Secondary | ICD-10-CM | POA: Diagnosis not present

## 2022-01-24 DIAGNOSIS — N2581 Secondary hyperparathyroidism of renal origin: Secondary | ICD-10-CM | POA: Diagnosis not present

## 2022-01-24 DIAGNOSIS — N186 End stage renal disease: Secondary | ICD-10-CM | POA: Diagnosis not present

## 2022-01-25 DIAGNOSIS — N186 End stage renal disease: Secondary | ICD-10-CM | POA: Diagnosis not present

## 2022-01-25 DIAGNOSIS — Z992 Dependence on renal dialysis: Secondary | ICD-10-CM | POA: Diagnosis not present

## 2022-01-25 DIAGNOSIS — N2581 Secondary hyperparathyroidism of renal origin: Secondary | ICD-10-CM | POA: Diagnosis not present

## 2022-01-26 DIAGNOSIS — N186 End stage renal disease: Secondary | ICD-10-CM | POA: Diagnosis not present

## 2022-01-26 DIAGNOSIS — Z992 Dependence on renal dialysis: Secondary | ICD-10-CM | POA: Diagnosis not present

## 2022-01-26 DIAGNOSIS — N2581 Secondary hyperparathyroidism of renal origin: Secondary | ICD-10-CM | POA: Diagnosis not present

## 2022-01-27 ENCOUNTER — Other Ambulatory Visit: Payer: Medicare PPO

## 2022-01-27 DIAGNOSIS — H3563 Retinal hemorrhage, bilateral: Secondary | ICD-10-CM | POA: Diagnosis not present

## 2022-01-27 DIAGNOSIS — N186 End stage renal disease: Secondary | ICD-10-CM | POA: Diagnosis not present

## 2022-01-27 DIAGNOSIS — N2581 Secondary hyperparathyroidism of renal origin: Secondary | ICD-10-CM | POA: Diagnosis not present

## 2022-01-27 DIAGNOSIS — H43811 Vitreous degeneration, right eye: Secondary | ICD-10-CM | POA: Diagnosis not present

## 2022-01-27 DIAGNOSIS — E113393 Type 2 diabetes mellitus with moderate nonproliferative diabetic retinopathy without macular edema, bilateral: Secondary | ICD-10-CM | POA: Diagnosis not present

## 2022-01-27 DIAGNOSIS — Z992 Dependence on renal dialysis: Secondary | ICD-10-CM | POA: Diagnosis not present

## 2022-01-28 DIAGNOSIS — D690 Allergic purpura: Secondary | ICD-10-CM | POA: Diagnosis not present

## 2022-01-28 DIAGNOSIS — Z992 Dependence on renal dialysis: Secondary | ICD-10-CM | POA: Diagnosis not present

## 2022-01-28 DIAGNOSIS — N186 End stage renal disease: Secondary | ICD-10-CM | POA: Diagnosis not present

## 2022-01-28 DIAGNOSIS — N2581 Secondary hyperparathyroidism of renal origin: Secondary | ICD-10-CM | POA: Diagnosis not present

## 2022-01-29 DIAGNOSIS — N2581 Secondary hyperparathyroidism of renal origin: Secondary | ICD-10-CM | POA: Diagnosis not present

## 2022-01-29 DIAGNOSIS — N186 End stage renal disease: Secondary | ICD-10-CM | POA: Diagnosis not present

## 2022-01-29 DIAGNOSIS — Z992 Dependence on renal dialysis: Secondary | ICD-10-CM | POA: Diagnosis not present

## 2022-01-30 DIAGNOSIS — N186 End stage renal disease: Secondary | ICD-10-CM | POA: Diagnosis not present

## 2022-01-30 DIAGNOSIS — Z992 Dependence on renal dialysis: Secondary | ICD-10-CM | POA: Diagnosis not present

## 2022-01-30 DIAGNOSIS — N2581 Secondary hyperparathyroidism of renal origin: Secondary | ICD-10-CM | POA: Diagnosis not present

## 2022-01-31 DIAGNOSIS — Z992 Dependence on renal dialysis: Secondary | ICD-10-CM | POA: Diagnosis not present

## 2022-01-31 DIAGNOSIS — N2581 Secondary hyperparathyroidism of renal origin: Secondary | ICD-10-CM | POA: Diagnosis not present

## 2022-01-31 DIAGNOSIS — N186 End stage renal disease: Secondary | ICD-10-CM | POA: Diagnosis not present

## 2022-02-01 DIAGNOSIS — N186 End stage renal disease: Secondary | ICD-10-CM | POA: Diagnosis not present

## 2022-02-01 DIAGNOSIS — Z992 Dependence on renal dialysis: Secondary | ICD-10-CM | POA: Diagnosis not present

## 2022-02-01 DIAGNOSIS — N2581 Secondary hyperparathyroidism of renal origin: Secondary | ICD-10-CM | POA: Diagnosis not present

## 2022-02-02 DIAGNOSIS — N2581 Secondary hyperparathyroidism of renal origin: Secondary | ICD-10-CM | POA: Diagnosis not present

## 2022-02-02 DIAGNOSIS — Z992 Dependence on renal dialysis: Secondary | ICD-10-CM | POA: Diagnosis not present

## 2022-02-02 DIAGNOSIS — N186 End stage renal disease: Secondary | ICD-10-CM | POA: Diagnosis not present

## 2022-02-03 DIAGNOSIS — Z992 Dependence on renal dialysis: Secondary | ICD-10-CM | POA: Diagnosis not present

## 2022-02-03 DIAGNOSIS — N2581 Secondary hyperparathyroidism of renal origin: Secondary | ICD-10-CM | POA: Diagnosis not present

## 2022-02-03 DIAGNOSIS — N186 End stage renal disease: Secondary | ICD-10-CM | POA: Diagnosis not present

## 2022-02-04 DIAGNOSIS — N186 End stage renal disease: Secondary | ICD-10-CM | POA: Diagnosis not present

## 2022-02-04 DIAGNOSIS — N2581 Secondary hyperparathyroidism of renal origin: Secondary | ICD-10-CM | POA: Diagnosis not present

## 2022-02-04 DIAGNOSIS — Z992 Dependence on renal dialysis: Secondary | ICD-10-CM | POA: Diagnosis not present

## 2022-02-05 DIAGNOSIS — Z992 Dependence on renal dialysis: Secondary | ICD-10-CM | POA: Diagnosis not present

## 2022-02-05 DIAGNOSIS — N186 End stage renal disease: Secondary | ICD-10-CM | POA: Diagnosis not present

## 2022-02-05 DIAGNOSIS — Z23 Encounter for immunization: Secondary | ICD-10-CM | POA: Diagnosis not present

## 2022-02-05 DIAGNOSIS — N2581 Secondary hyperparathyroidism of renal origin: Secondary | ICD-10-CM | POA: Diagnosis not present

## 2022-02-06 DIAGNOSIS — N2581 Secondary hyperparathyroidism of renal origin: Secondary | ICD-10-CM | POA: Diagnosis not present

## 2022-02-06 DIAGNOSIS — N186 End stage renal disease: Secondary | ICD-10-CM | POA: Diagnosis not present

## 2022-02-06 DIAGNOSIS — Z992 Dependence on renal dialysis: Secondary | ICD-10-CM | POA: Diagnosis not present

## 2022-02-07 DIAGNOSIS — N2581 Secondary hyperparathyroidism of renal origin: Secondary | ICD-10-CM | POA: Diagnosis not present

## 2022-02-07 DIAGNOSIS — Z992 Dependence on renal dialysis: Secondary | ICD-10-CM | POA: Diagnosis not present

## 2022-02-07 DIAGNOSIS — N186 End stage renal disease: Secondary | ICD-10-CM | POA: Diagnosis not present

## 2022-02-08 DIAGNOSIS — N186 End stage renal disease: Secondary | ICD-10-CM | POA: Diagnosis not present

## 2022-02-08 DIAGNOSIS — N2581 Secondary hyperparathyroidism of renal origin: Secondary | ICD-10-CM | POA: Diagnosis not present

## 2022-02-08 DIAGNOSIS — Z992 Dependence on renal dialysis: Secondary | ICD-10-CM | POA: Diagnosis not present

## 2022-02-09 DIAGNOSIS — N2581 Secondary hyperparathyroidism of renal origin: Secondary | ICD-10-CM | POA: Diagnosis not present

## 2022-02-09 DIAGNOSIS — N186 End stage renal disease: Secondary | ICD-10-CM | POA: Diagnosis not present

## 2022-02-09 DIAGNOSIS — Z992 Dependence on renal dialysis: Secondary | ICD-10-CM | POA: Diagnosis not present

## 2022-02-10 DIAGNOSIS — N186 End stage renal disease: Secondary | ICD-10-CM | POA: Diagnosis not present

## 2022-02-10 DIAGNOSIS — N2581 Secondary hyperparathyroidism of renal origin: Secondary | ICD-10-CM | POA: Diagnosis not present

## 2022-02-10 DIAGNOSIS — Z992 Dependence on renal dialysis: Secondary | ICD-10-CM | POA: Diagnosis not present

## 2022-02-11 DIAGNOSIS — N186 End stage renal disease: Secondary | ICD-10-CM | POA: Diagnosis not present

## 2022-02-11 DIAGNOSIS — N2581 Secondary hyperparathyroidism of renal origin: Secondary | ICD-10-CM | POA: Diagnosis not present

## 2022-02-11 DIAGNOSIS — Z992 Dependence on renal dialysis: Secondary | ICD-10-CM | POA: Diagnosis not present

## 2022-02-12 DIAGNOSIS — Z992 Dependence on renal dialysis: Secondary | ICD-10-CM | POA: Diagnosis not present

## 2022-02-12 DIAGNOSIS — N186 End stage renal disease: Secondary | ICD-10-CM | POA: Diagnosis not present

## 2022-02-12 DIAGNOSIS — N2581 Secondary hyperparathyroidism of renal origin: Secondary | ICD-10-CM | POA: Diagnosis not present

## 2022-02-13 DIAGNOSIS — Z992 Dependence on renal dialysis: Secondary | ICD-10-CM | POA: Diagnosis not present

## 2022-02-13 DIAGNOSIS — N2581 Secondary hyperparathyroidism of renal origin: Secondary | ICD-10-CM | POA: Diagnosis not present

## 2022-02-13 DIAGNOSIS — N186 End stage renal disease: Secondary | ICD-10-CM | POA: Diagnosis not present

## 2022-02-14 DIAGNOSIS — N2581 Secondary hyperparathyroidism of renal origin: Secondary | ICD-10-CM | POA: Diagnosis not present

## 2022-02-14 DIAGNOSIS — Z992 Dependence on renal dialysis: Secondary | ICD-10-CM | POA: Diagnosis not present

## 2022-02-14 DIAGNOSIS — N186 End stage renal disease: Secondary | ICD-10-CM | POA: Diagnosis not present

## 2022-02-15 DIAGNOSIS — Z992 Dependence on renal dialysis: Secondary | ICD-10-CM | POA: Diagnosis not present

## 2022-02-15 DIAGNOSIS — N186 End stage renal disease: Secondary | ICD-10-CM | POA: Diagnosis not present

## 2022-02-15 DIAGNOSIS — N2581 Secondary hyperparathyroidism of renal origin: Secondary | ICD-10-CM | POA: Diagnosis not present

## 2022-02-16 ENCOUNTER — Other Ambulatory Visit: Payer: Self-pay | Admitting: Nephrology

## 2022-02-16 ENCOUNTER — Ambulatory Visit
Admission: RE | Admit: 2022-02-16 | Discharge: 2022-02-16 | Disposition: A | Discharge status: Home / Self Care | Payer: Medicare PPO | Source: Ambulatory Visit | Attending: Nephrology | Admitting: Nephrology

## 2022-02-16 DIAGNOSIS — Z992 Dependence on renal dialysis: Secondary | ICD-10-CM | POA: Diagnosis not present

## 2022-02-16 DIAGNOSIS — N2581 Secondary hyperparathyroidism of renal origin: Secondary | ICD-10-CM | POA: Diagnosis not present

## 2022-02-16 DIAGNOSIS — N186 End stage renal disease: Secondary | ICD-10-CM | POA: Diagnosis not present

## 2022-02-16 DIAGNOSIS — R1084 Generalized abdominal pain: Secondary | ICD-10-CM

## 2022-02-16 DIAGNOSIS — R109 Unspecified abdominal pain: Secondary | ICD-10-CM | POA: Diagnosis not present

## 2022-02-17 DIAGNOSIS — N2581 Secondary hyperparathyroidism of renal origin: Secondary | ICD-10-CM | POA: Diagnosis not present

## 2022-02-17 DIAGNOSIS — Z992 Dependence on renal dialysis: Secondary | ICD-10-CM | POA: Diagnosis not present

## 2022-02-17 DIAGNOSIS — N186 End stage renal disease: Secondary | ICD-10-CM | POA: Diagnosis not present

## 2022-02-18 DIAGNOSIS — Z992 Dependence on renal dialysis: Secondary | ICD-10-CM | POA: Diagnosis not present

## 2022-02-18 DIAGNOSIS — N2581 Secondary hyperparathyroidism of renal origin: Secondary | ICD-10-CM | POA: Diagnosis not present

## 2022-02-18 DIAGNOSIS — N186 End stage renal disease: Secondary | ICD-10-CM | POA: Diagnosis not present

## 2022-02-19 DIAGNOSIS — N186 End stage renal disease: Secondary | ICD-10-CM | POA: Diagnosis not present

## 2022-02-19 DIAGNOSIS — Z992 Dependence on renal dialysis: Secondary | ICD-10-CM | POA: Diagnosis not present

## 2022-02-19 DIAGNOSIS — N2581 Secondary hyperparathyroidism of renal origin: Secondary | ICD-10-CM | POA: Diagnosis not present

## 2022-02-20 DIAGNOSIS — N186 End stage renal disease: Secondary | ICD-10-CM | POA: Diagnosis not present

## 2022-02-20 DIAGNOSIS — N2581 Secondary hyperparathyroidism of renal origin: Secondary | ICD-10-CM | POA: Diagnosis not present

## 2022-02-20 DIAGNOSIS — Z992 Dependence on renal dialysis: Secondary | ICD-10-CM | POA: Diagnosis not present

## 2022-02-21 DIAGNOSIS — Z992 Dependence on renal dialysis: Secondary | ICD-10-CM | POA: Diagnosis not present

## 2022-02-21 DIAGNOSIS — N2581 Secondary hyperparathyroidism of renal origin: Secondary | ICD-10-CM | POA: Diagnosis not present

## 2022-02-21 DIAGNOSIS — N186 End stage renal disease: Secondary | ICD-10-CM | POA: Diagnosis not present

## 2022-02-22 DIAGNOSIS — Z992 Dependence on renal dialysis: Secondary | ICD-10-CM | POA: Diagnosis not present

## 2022-02-22 DIAGNOSIS — N2581 Secondary hyperparathyroidism of renal origin: Secondary | ICD-10-CM | POA: Diagnosis not present

## 2022-02-22 DIAGNOSIS — N186 End stage renal disease: Secondary | ICD-10-CM | POA: Diagnosis not present

## 2022-02-23 DIAGNOSIS — I499 Cardiac arrhythmia, unspecified: Secondary | ICD-10-CM | POA: Diagnosis not present

## 2022-02-23 DIAGNOSIS — I469 Cardiac arrest, cause unspecified: Secondary | ICD-10-CM | POA: Diagnosis not present

## 2022-02-23 DIAGNOSIS — R55 Syncope and collapse: Secondary | ICD-10-CM | POA: Diagnosis not present

## 2022-02-23 DIAGNOSIS — R404 Transient alteration of awareness: Secondary | ICD-10-CM | POA: Diagnosis not present

## 2022-02-23 DIAGNOSIS — Z743 Need for continuous supervision: Secondary | ICD-10-CM | POA: Diagnosis not present

## 2022-02-27 DIAGNOSIS — N186 End stage renal disease: Secondary | ICD-10-CM | POA: Diagnosis not present

## 2022-02-27 DIAGNOSIS — Z992 Dependence on renal dialysis: Secondary | ICD-10-CM | POA: Diagnosis not present

## 2022-02-27 DIAGNOSIS — D690 Allergic purpura: Secondary | ICD-10-CM | POA: Diagnosis not present

## 2022-02-28 DEATH — deceased

## 2022-03-25 ENCOUNTER — Ambulatory Visit: Payer: Medicare PPO | Admitting: Student
